# Patient Record
Sex: Male | Born: 1940 | Race: White | Hispanic: No | Marital: Married | Smoking: Former smoker
Health system: Southern US, Community
[De-identification: ages and names within clinical notes are randomized; demographics above are authoritative.]

## PROBLEM LIST (undated history)

## (undated) DIAGNOSIS — I2510 Atherosclerotic heart disease of native coronary artery without angina pectoris: Secondary | ICD-10-CM

## (undated) DIAGNOSIS — H269 Unspecified cataract: Secondary | ICD-10-CM

## (undated) DIAGNOSIS — I10 Essential (primary) hypertension: Secondary | ICD-10-CM

## (undated) DIAGNOSIS — K279 Peptic ulcer, site unspecified, unspecified as acute or chronic, without hemorrhage or perforation: Secondary | ICD-10-CM

## (undated) DIAGNOSIS — E785 Hyperlipidemia, unspecified: Secondary | ICD-10-CM

## (undated) DIAGNOSIS — K635 Polyp of colon: Secondary | ICD-10-CM

## (undated) DIAGNOSIS — H539 Unspecified visual disturbance: Secondary | ICD-10-CM

## (undated) DIAGNOSIS — I255 Ischemic cardiomyopathy: Secondary | ICD-10-CM

## (undated) DIAGNOSIS — F32A Depression, unspecified: Secondary | ICD-10-CM

## (undated) DIAGNOSIS — R51 Headache: Secondary | ICD-10-CM

## (undated) DIAGNOSIS — F329 Major depressive disorder, single episode, unspecified: Secondary | ICD-10-CM

## (undated) DIAGNOSIS — E611 Iron deficiency: Secondary | ICD-10-CM

## (undated) DIAGNOSIS — T7840XA Allergy, unspecified, initial encounter: Secondary | ICD-10-CM

## (undated) DIAGNOSIS — M1990 Unspecified osteoarthritis, unspecified site: Secondary | ICD-10-CM

## (undated) DIAGNOSIS — I219 Acute myocardial infarction, unspecified: Secondary | ICD-10-CM

## (undated) DIAGNOSIS — F419 Anxiety disorder, unspecified: Secondary | ICD-10-CM

## (undated) DIAGNOSIS — K219 Gastro-esophageal reflux disease without esophagitis: Secondary | ICD-10-CM

## (undated) HISTORY — DX: Polyp of colon: K63.5

## (undated) HISTORY — PX: INGUINAL HERNIA REPAIR: SUR1180

## (undated) HISTORY — DX: Atherosclerotic heart disease of native coronary artery without angina pectoris: I25.10

## (undated) HISTORY — DX: Gastro-esophageal reflux disease without esophagitis: K21.9

## (undated) HISTORY — DX: Acute myocardial infarction, unspecified: I21.9

## (undated) HISTORY — DX: Essential (primary) hypertension: I10

## (undated) HISTORY — DX: Unspecified osteoarthritis, unspecified site: M19.90

## (undated) HISTORY — DX: Peptic ulcer, site unspecified, unspecified as acute or chronic, without hemorrhage or perforation: K27.9

## (undated) HISTORY — DX: Ischemic cardiomyopathy: I25.5

## (undated) HISTORY — DX: Major depressive disorder, single episode, unspecified: F32.9

## (undated) HISTORY — DX: Headache: R51

## (undated) HISTORY — DX: Depression, unspecified: F32.A

## (undated) HISTORY — DX: Anxiety disorder, unspecified: F41.9

## (undated) HISTORY — DX: Iron deficiency: E61.1

## (undated) HISTORY — DX: Unspecified visual disturbance: H53.9

## (undated) HISTORY — PX: TONSILLECTOMY: SUR1361

## (undated) HISTORY — DX: Hyperlipidemia, unspecified: E78.5

## (undated) HISTORY — DX: Allergy, unspecified, initial encounter: T78.40XA

## (undated) HISTORY — DX: Unspecified cataract: H26.9

---

## 1999-06-30 ENCOUNTER — Emergency Department (HOSPITAL_COMMUNITY): Admission: EM | Admit: 1999-06-30 | Discharge: 1999-06-30 | Payer: Self-pay

## 2000-04-23 ENCOUNTER — Encounter: Admission: RE | Admit: 2000-04-23 | Discharge: 2000-04-23 | Payer: Self-pay | Admitting: Family Medicine

## 2000-04-23 ENCOUNTER — Encounter: Payer: Self-pay | Admitting: Family Medicine

## 2000-10-08 ENCOUNTER — Encounter: Payer: Self-pay | Admitting: Family Medicine

## 2000-10-08 ENCOUNTER — Encounter: Admission: RE | Admit: 2000-10-08 | Discharge: 2000-10-08 | Payer: Self-pay | Admitting: Family Medicine

## 2001-02-02 ENCOUNTER — Encounter: Payer: Self-pay | Admitting: Family Medicine

## 2001-02-02 ENCOUNTER — Encounter: Admission: RE | Admit: 2001-02-02 | Discharge: 2001-02-02 | Payer: Self-pay | Admitting: Family Medicine

## 2002-02-01 ENCOUNTER — Encounter: Payer: Self-pay | Admitting: Family Medicine

## 2002-02-01 ENCOUNTER — Encounter: Admission: RE | Admit: 2002-02-01 | Discharge: 2002-02-01 | Payer: Self-pay | Admitting: Family Medicine

## 2002-03-04 HISTORY — PX: CORONARY ARTERY BYPASS GRAFT: SHX141

## 2002-11-04 ENCOUNTER — Inpatient Hospital Stay (HOSPITAL_COMMUNITY): Admission: AD | Admit: 2002-11-04 | Discharge: 2002-11-16 | Payer: Self-pay | Admitting: Cardiology

## 2002-11-04 ENCOUNTER — Encounter: Payer: Self-pay | Admitting: Cardiology

## 2002-11-10 ENCOUNTER — Encounter: Payer: Self-pay | Admitting: Surgery

## 2002-11-11 ENCOUNTER — Encounter: Payer: Self-pay | Admitting: Surgery

## 2002-11-12 ENCOUNTER — Encounter: Payer: Self-pay | Admitting: Surgery

## 2002-11-15 ENCOUNTER — Encounter: Payer: Self-pay | Admitting: Surgery

## 2002-12-13 ENCOUNTER — Encounter (HOSPITAL_COMMUNITY): Admission: RE | Admit: 2002-12-13 | Discharge: 2003-02-07 | Payer: Self-pay | Admitting: Cardiology

## 2003-02-08 ENCOUNTER — Encounter (HOSPITAL_COMMUNITY): Admission: RE | Admit: 2003-02-08 | Discharge: 2003-05-09 | Payer: Self-pay | Admitting: Cardiology

## 2003-06-01 ENCOUNTER — Emergency Department (HOSPITAL_COMMUNITY): Admission: EM | Admit: 2003-06-01 | Discharge: 2003-06-01 | Payer: Self-pay | Admitting: Emergency Medicine

## 2003-06-03 DIAGNOSIS — R51 Headache: Secondary | ICD-10-CM

## 2003-06-03 HISTORY — DX: Headache: R51

## 2003-06-04 ENCOUNTER — Encounter (HOSPITAL_COMMUNITY): Admission: RE | Admit: 2003-06-04 | Discharge: 2003-09-02 | Payer: Self-pay | Admitting: Cardiology

## 2003-09-06 ENCOUNTER — Encounter (HOSPITAL_COMMUNITY): Admission: RE | Admit: 2003-09-06 | Discharge: 2003-12-05 | Payer: Self-pay | Admitting: Cardiology

## 2003-12-06 ENCOUNTER — Encounter (HOSPITAL_COMMUNITY): Admission: RE | Admit: 2003-12-06 | Discharge: 2004-03-05 | Payer: Self-pay | Admitting: Cardiology

## 2004-01-10 ENCOUNTER — Ambulatory Visit: Payer: Self-pay | Admitting: Internal Medicine

## 2004-02-01 ENCOUNTER — Ambulatory Visit: Payer: Self-pay | Admitting: Internal Medicine

## 2004-02-23 ENCOUNTER — Ambulatory Visit: Payer: Self-pay | Admitting: Cardiology

## 2004-03-04 LAB — HM COLONOSCOPY

## 2004-03-06 ENCOUNTER — Encounter (HOSPITAL_COMMUNITY): Admission: RE | Admit: 2004-03-06 | Discharge: 2004-06-04 | Payer: Self-pay | Admitting: Cardiology

## 2004-03-19 ENCOUNTER — Ambulatory Visit: Payer: Self-pay | Admitting: Family Medicine

## 2004-03-23 ENCOUNTER — Ambulatory Visit: Payer: Self-pay | Admitting: Internal Medicine

## 2004-04-30 ENCOUNTER — Ambulatory Visit: Payer: Self-pay | Admitting: Internal Medicine

## 2004-06-05 ENCOUNTER — Encounter (HOSPITAL_COMMUNITY): Admission: RE | Admit: 2004-06-05 | Discharge: 2004-07-02 | Payer: Self-pay | Admitting: Cardiology

## 2004-06-28 ENCOUNTER — Ambulatory Visit: Payer: Self-pay | Admitting: Internal Medicine

## 2004-07-31 ENCOUNTER — Ambulatory Visit: Payer: Self-pay | Admitting: Internal Medicine

## 2004-08-21 ENCOUNTER — Ambulatory Visit: Payer: Self-pay | Admitting: Internal Medicine

## 2004-09-11 ENCOUNTER — Ambulatory Visit: Payer: Self-pay | Admitting: Cardiology

## 2004-10-26 ENCOUNTER — Ambulatory Visit: Payer: Self-pay | Admitting: Internal Medicine

## 2004-11-12 ENCOUNTER — Ambulatory Visit: Payer: Self-pay | Admitting: Internal Medicine

## 2004-12-28 ENCOUNTER — Ambulatory Visit: Payer: Self-pay | Admitting: Internal Medicine

## 2005-02-11 ENCOUNTER — Ambulatory Visit: Payer: Self-pay | Admitting: Internal Medicine

## 2005-03-18 ENCOUNTER — Ambulatory Visit: Payer: Self-pay | Admitting: Cardiology

## 2005-04-26 ENCOUNTER — Ambulatory Visit: Payer: Self-pay | Admitting: Internal Medicine

## 2005-05-15 ENCOUNTER — Ambulatory Visit: Payer: Self-pay | Admitting: Internal Medicine

## 2005-05-22 ENCOUNTER — Ambulatory Visit: Payer: Self-pay | Admitting: Internal Medicine

## 2005-09-23 ENCOUNTER — Ambulatory Visit: Payer: Self-pay | Admitting: Internal Medicine

## 2005-10-07 ENCOUNTER — Ambulatory Visit: Payer: Self-pay | Admitting: Internal Medicine

## 2005-11-07 ENCOUNTER — Ambulatory Visit: Payer: Self-pay | Admitting: Internal Medicine

## 2005-12-19 ENCOUNTER — Ambulatory Visit: Payer: Self-pay | Admitting: Internal Medicine

## 2006-03-04 DIAGNOSIS — K635 Polyp of colon: Secondary | ICD-10-CM

## 2006-03-04 HISTORY — DX: Polyp of colon: K63.5

## 2006-03-12 ENCOUNTER — Ambulatory Visit: Payer: Self-pay | Admitting: Internal Medicine

## 2006-03-19 ENCOUNTER — Ambulatory Visit: Payer: Self-pay | Admitting: Internal Medicine

## 2006-03-21 ENCOUNTER — Ambulatory Visit: Payer: Self-pay | Admitting: Internal Medicine

## 2006-03-21 LAB — CONVERTED CEMR LAB
ALT: 23 units/L (ref 0–40)
AST: 20 units/L (ref 0–37)
Albumin: 3 g/dL — ABNORMAL LOW (ref 3.5–5.2)
Alkaline Phosphatase: 55 units/L (ref 39–117)
BUN: 23 mg/dL (ref 6–23)
CO2: 29 meq/L (ref 19–32)
Calcium: 8.8 mg/dL (ref 8.4–10.5)
Chloride: 104 meq/L (ref 96–112)
Creatinine, Ser: 1.5 mg/dL (ref 0.4–1.5)
GFR calc Af Amer: 60 mL/min
GFR calc non Af Amer: 50 mL/min
Glucose, Bld: 145 mg/dL — ABNORMAL HIGH (ref 70–99)
HCT: 38 % — ABNORMAL LOW (ref 39.0–52.0)
Hemoglobin: 12.1 g/dL — ABNORMAL LOW (ref 13.0–17.0)
MCHC: 31.8 g/dL (ref 30.0–36.0)
MCV: 80.3 fL (ref 78.0–100.0)
Platelets: 230 10*3/uL (ref 150–400)
Potassium: 3.3 meq/L — ABNORMAL LOW (ref 3.5–5.1)
RBC: 4.73 M/uL (ref 4.22–5.81)
RDW: 15.7 % — ABNORMAL HIGH (ref 11.5–14.6)
Sodium: 140 meq/L (ref 135–145)
Total Bilirubin: 0.7 mg/dL (ref 0.3–1.2)
Total Protein: 6.3 g/dL (ref 6.0–8.3)
WBC: 8.9 10*3/uL (ref 4.5–10.5)

## 2006-06-24 DIAGNOSIS — R519 Headache, unspecified: Secondary | ICD-10-CM | POA: Insufficient documentation

## 2006-06-24 DIAGNOSIS — F32A Depression, unspecified: Secondary | ICD-10-CM | POA: Insufficient documentation

## 2006-06-24 DIAGNOSIS — Z8679 Personal history of other diseases of the circulatory system: Secondary | ICD-10-CM | POA: Insufficient documentation

## 2006-06-24 DIAGNOSIS — K219 Gastro-esophageal reflux disease without esophagitis: Secondary | ICD-10-CM | POA: Insufficient documentation

## 2006-06-24 DIAGNOSIS — F329 Major depressive disorder, single episode, unspecified: Secondary | ICD-10-CM

## 2006-06-24 DIAGNOSIS — F419 Anxiety disorder, unspecified: Secondary | ICD-10-CM | POA: Insufficient documentation

## 2006-06-24 DIAGNOSIS — R51 Headache: Secondary | ICD-10-CM | POA: Insufficient documentation

## 2006-06-24 DIAGNOSIS — Z9889 Other specified postprocedural states: Secondary | ICD-10-CM | POA: Insufficient documentation

## 2006-06-24 DIAGNOSIS — I10 Essential (primary) hypertension: Secondary | ICD-10-CM | POA: Insufficient documentation

## 2006-06-24 DIAGNOSIS — E785 Hyperlipidemia, unspecified: Secondary | ICD-10-CM | POA: Insufficient documentation

## 2006-07-24 ENCOUNTER — Ambulatory Visit: Payer: Self-pay | Admitting: Cardiology

## 2006-08-11 ENCOUNTER — Encounter (INDEPENDENT_AMBULATORY_CARE_PROVIDER_SITE_OTHER): Payer: Self-pay | Admitting: *Deleted

## 2006-09-02 ENCOUNTER — Ambulatory Visit: Payer: Self-pay | Admitting: Internal Medicine

## 2006-09-03 ENCOUNTER — Ambulatory Visit: Payer: Self-pay | Admitting: Internal Medicine

## 2006-09-12 ENCOUNTER — Encounter (INDEPENDENT_AMBULATORY_CARE_PROVIDER_SITE_OTHER): Payer: Self-pay | Admitting: *Deleted

## 2006-09-12 LAB — CONVERTED CEMR LAB
ALT: 17 units/L (ref 0–53)
AST: 22 units/L (ref 0–37)
BUN: 14 mg/dL (ref 6–23)
Basophils Absolute: 0 10*3/uL (ref 0.0–0.1)
CO2: 30 meq/L (ref 19–32)
Chloride: 107 meq/L (ref 96–112)
Cholesterol: 126 mg/dL (ref 0–200)
GFR calc Af Amer: 96 mL/min
HDL: 33.2 mg/dL — ABNORMAL LOW (ref >39.0)
Hemoglobin: 11.6 g/dL — ABNORMAL LOW (ref 13.0–17.0)
Hgb A1c MFr Bld: 6.4 % — ABNORMAL HIGH (ref 4.6–6.0)
LDL Cholesterol: 72 mg/dL (ref 0–99)
Lymphocytes Relative: 23 % (ref 12.0–46.0)
MCV: 80.5 fL (ref 78.0–100.0)
Monocytes Absolute: 0.5 10*3/uL (ref 0.2–0.7)
Neutro Abs: 3.3 10*3/uL (ref 1.4–7.7)
Potassium: 4.3 meq/L (ref 3.5–5.1)
Sodium: 141 meq/L (ref 135–145)
Total CHOL/HDL Ratio: 3.8
Triglycerides: 102 mg/dL (ref 0–149)
VLDL: 20 mg/dL (ref 0–40)
WBC: 5.9 10*3/uL (ref 4.5–10.5)

## 2006-09-24 ENCOUNTER — Ambulatory Visit: Payer: Self-pay | Admitting: Internal Medicine

## 2006-09-25 LAB — CONVERTED CEMR LAB: Ferritin: 16.8 ng/mL — ABNORMAL LOW (ref 22.0–322.0)

## 2006-09-26 ENCOUNTER — Telehealth (INDEPENDENT_AMBULATORY_CARE_PROVIDER_SITE_OTHER): Payer: Self-pay | Admitting: *Deleted

## 2006-10-10 ENCOUNTER — Telehealth (INDEPENDENT_AMBULATORY_CARE_PROVIDER_SITE_OTHER): Payer: Self-pay | Admitting: *Deleted

## 2006-10-15 ENCOUNTER — Ambulatory Visit: Payer: Self-pay | Admitting: Internal Medicine

## 2006-10-23 ENCOUNTER — Ambulatory Visit: Payer: Self-pay | Admitting: Internal Medicine

## 2006-10-27 LAB — CONVERTED CEMR LAB
BUN: 14 mg/dL (ref 6–23)
Chloride: 99 meq/L (ref 96–112)
Creatinine, Ser: 1.1 mg/dL (ref 0.4–1.5)
GFR calc Af Amer: 86 mL/min
GFR calc non Af Amer: 71 mL/min
Potassium: 4.7 meq/L (ref 3.5–5.1)
Sodium: 136 meq/L (ref 135–145)

## 2006-10-28 ENCOUNTER — Telehealth (INDEPENDENT_AMBULATORY_CARE_PROVIDER_SITE_OTHER): Payer: Self-pay | Admitting: *Deleted

## 2006-10-29 ENCOUNTER — Ambulatory Visit: Payer: Self-pay | Admitting: Gastroenterology

## 2006-11-05 ENCOUNTER — Encounter: Payer: Self-pay | Admitting: Internal Medicine

## 2006-12-03 ENCOUNTER — Ambulatory Visit: Payer: Self-pay | Admitting: Gastroenterology

## 2006-12-03 ENCOUNTER — Encounter: Payer: Self-pay | Admitting: Internal Medicine

## 2006-12-03 ENCOUNTER — Encounter: Payer: Self-pay | Admitting: Gastroenterology

## 2006-12-24 ENCOUNTER — Telehealth (INDEPENDENT_AMBULATORY_CARE_PROVIDER_SITE_OTHER): Payer: Self-pay | Admitting: *Deleted

## 2007-01-14 ENCOUNTER — Ambulatory Visit: Payer: Self-pay | Admitting: Internal Medicine

## 2007-02-02 ENCOUNTER — Encounter (INDEPENDENT_AMBULATORY_CARE_PROVIDER_SITE_OTHER): Payer: Self-pay | Admitting: *Deleted

## 2007-03-02 ENCOUNTER — Telehealth (INDEPENDENT_AMBULATORY_CARE_PROVIDER_SITE_OTHER): Payer: Self-pay | Admitting: *Deleted

## 2007-03-10 ENCOUNTER — Ambulatory Visit: Payer: Self-pay | Admitting: Internal Medicine

## 2007-03-17 ENCOUNTER — Telehealth (INDEPENDENT_AMBULATORY_CARE_PROVIDER_SITE_OTHER): Payer: Self-pay | Admitting: *Deleted

## 2007-03-17 ENCOUNTER — Encounter: Payer: Self-pay | Admitting: Internal Medicine

## 2007-03-19 ENCOUNTER — Telehealth (INDEPENDENT_AMBULATORY_CARE_PROVIDER_SITE_OTHER): Payer: Self-pay | Admitting: *Deleted

## 2007-03-24 ENCOUNTER — Telehealth: Payer: Self-pay | Admitting: Internal Medicine

## 2007-06-01 DIAGNOSIS — I2510 Atherosclerotic heart disease of native coronary artery without angina pectoris: Secondary | ICD-10-CM | POA: Insufficient documentation

## 2007-06-01 DIAGNOSIS — D126 Benign neoplasm of colon, unspecified: Secondary | ICD-10-CM | POA: Insufficient documentation

## 2007-06-01 DIAGNOSIS — M1990 Unspecified osteoarthritis, unspecified site: Secondary | ICD-10-CM | POA: Insufficient documentation

## 2007-06-09 ENCOUNTER — Ambulatory Visit: Payer: Self-pay | Admitting: Internal Medicine

## 2007-06-17 LAB — CONVERTED CEMR LAB
ALT: 31 units/L (ref 0–53)
Calcium: 9.6 mg/dL (ref 8.4–10.5)
Chloride: 99 meq/L (ref 96–112)
Hemoglobin: 17.4 g/dL — ABNORMAL HIGH (ref 13.0–17.0)
Hgb A1c MFr Bld: 5.8 % (ref 4.6–6.0)
Potassium: 4.5 meq/L (ref 3.5–5.1)
Saturation Ratios: 27.5 % (ref 20.0–50.0)
Sodium: 138 meq/L (ref 135–145)

## 2007-07-25 ENCOUNTER — Ambulatory Visit: Payer: Self-pay | Admitting: Family Medicine

## 2007-08-03 ENCOUNTER — Ambulatory Visit: Payer: Self-pay | Admitting: Cardiology

## 2007-08-15 ENCOUNTER — Emergency Department (HOSPITAL_COMMUNITY): Admission: EM | Admit: 2007-08-15 | Discharge: 2007-08-15 | Payer: Self-pay | Admitting: Family Medicine

## 2007-08-17 ENCOUNTER — Encounter: Payer: Self-pay | Admitting: Cardiology

## 2007-08-17 ENCOUNTER — Ambulatory Visit: Payer: Self-pay | Admitting: Cardiology

## 2007-08-17 ENCOUNTER — Ambulatory Visit: Payer: Self-pay

## 2007-08-17 LAB — CONVERTED CEMR LAB
BUN: 19 mg/dL (ref 6–23)
CO2: 31 meq/L (ref 19–32)
Calcium: 9.4 mg/dL (ref 8.4–10.5)
Chloride: 100 meq/L (ref 96–112)
Creatinine, Ser: 1.4 mg/dL (ref 0.4–1.5)
GFR calc Af Amer: 65 mL/min
GFR calc non Af Amer: 54 mL/min
Glucose, Bld: 90 mg/dL (ref 70–99)
Potassium: 4.5 meq/L (ref 3.5–5.1)
Sodium: 137 meq/L (ref 135–145)

## 2007-09-02 ENCOUNTER — Telehealth (INDEPENDENT_AMBULATORY_CARE_PROVIDER_SITE_OTHER): Payer: Self-pay | Admitting: *Deleted

## 2007-09-08 ENCOUNTER — Ambulatory Visit: Payer: Self-pay | Admitting: Internal Medicine

## 2007-09-15 ENCOUNTER — Telehealth (INDEPENDENT_AMBULATORY_CARE_PROVIDER_SITE_OTHER): Payer: Self-pay | Admitting: *Deleted

## 2007-10-05 ENCOUNTER — Ambulatory Visit: Payer: Self-pay | Admitting: Internal Medicine

## 2007-10-08 ENCOUNTER — Encounter (INDEPENDENT_AMBULATORY_CARE_PROVIDER_SITE_OTHER): Payer: Self-pay | Admitting: *Deleted

## 2007-10-23 ENCOUNTER — Ambulatory Visit: Payer: Self-pay | Admitting: Family Medicine

## 2007-10-23 ENCOUNTER — Telehealth (INDEPENDENT_AMBULATORY_CARE_PROVIDER_SITE_OTHER): Payer: Self-pay | Admitting: *Deleted

## 2007-11-25 ENCOUNTER — Ambulatory Visit: Payer: Self-pay | Admitting: Internal Medicine

## 2007-12-03 ENCOUNTER — Ambulatory Visit: Payer: Self-pay | Admitting: Cardiology

## 2008-02-04 ENCOUNTER — Ambulatory Visit: Payer: Self-pay | Admitting: Internal Medicine

## 2008-02-04 DIAGNOSIS — E291 Testicular hypofunction: Secondary | ICD-10-CM | POA: Insufficient documentation

## 2008-02-17 ENCOUNTER — Telehealth (INDEPENDENT_AMBULATORY_CARE_PROVIDER_SITE_OTHER): Payer: Self-pay | Admitting: *Deleted

## 2008-02-18 ENCOUNTER — Telehealth (INDEPENDENT_AMBULATORY_CARE_PROVIDER_SITE_OTHER): Payer: Self-pay | Admitting: *Deleted

## 2008-02-18 ENCOUNTER — Ambulatory Visit: Payer: Self-pay | Admitting: Internal Medicine

## 2008-02-23 ENCOUNTER — Ambulatory Visit: Payer: Self-pay | Admitting: Hematology and Oncology

## 2008-02-24 ENCOUNTER — Encounter (INDEPENDENT_AMBULATORY_CARE_PROVIDER_SITE_OTHER): Payer: Self-pay | Admitting: *Deleted

## 2008-02-24 LAB — CONVERTED CEMR LAB
Folate: 18.1 ng/mL
Vitamin B-12: 559 pg/mL (ref 211–911)

## 2008-03-03 ENCOUNTER — Encounter: Payer: Self-pay | Admitting: Internal Medicine

## 2008-03-03 LAB — CBC WITH DIFFERENTIAL/PLATELET
Basophils Absolute: 0 10*3/uL (ref 0.0–0.1)
EOS%: 10.3 % — ABNORMAL HIGH (ref 0.0–7.0)
HCT: 44.2 % (ref 38.7–49.9)
HGB: 15.5 g/dL (ref 13.0–17.1)
MONO#: 0.5 10*3/uL (ref 0.1–0.9)
NEUT#: 4.1 10*3/uL (ref 1.5–6.5)
NEUT%: 63.4 % (ref 40.0–75.0)
RDW: 12.2 % (ref 11.2–14.6)
WBC: 6.5 10*3/uL (ref 4.0–10.0)
lymph#: 1.2 10*3/uL (ref 0.9–3.3)

## 2008-03-03 LAB — TECHNOLOGIST REVIEW

## 2008-03-07 ENCOUNTER — Encounter: Payer: Self-pay | Admitting: Internal Medicine

## 2008-03-07 ENCOUNTER — Ambulatory Visit (HOSPITAL_COMMUNITY): Admission: RE | Admit: 2008-03-07 | Discharge: 2008-03-07 | Payer: Self-pay | Admitting: Hematology and Oncology

## 2008-03-08 LAB — PROTEIN ELECTROPHORESIS, SERUM, WITH REFLEX
Alpha-1-Globulin: 4.6 % (ref 2.9–4.9)
Alpha-2-Globulin: 10.4 % (ref 7.1–11.8)
Beta 2: 4.1 % (ref 3.2–6.5)
Gamma Globulin: 12.9 % (ref 11.1–18.8)

## 2008-03-08 LAB — COMPREHENSIVE METABOLIC PANEL
ALT: 25 U/L (ref 0–53)
Albumin: 4.5 g/dL (ref 3.5–5.2)
Alkaline Phosphatase: 60 U/L (ref 39–117)
CO2: 27 mEq/L (ref 19–32)
Glucose, Bld: 142 mg/dL — ABNORMAL HIGH (ref 70–99)
Potassium: 4.5 mEq/L (ref 3.5–5.3)
Sodium: 139 mEq/L (ref 135–145)
Total Bilirubin: 0.6 mg/dL (ref 0.3–1.2)
Total Protein: 7.1 g/dL (ref 6.0–8.3)

## 2008-03-08 LAB — LACTATE DEHYDROGENASE: LDH: 179 U/L (ref 94–250)

## 2008-03-08 LAB — D-DIMER, QUANTITATIVE: D-Dimer, Quant: 1.08 ug/mL-FEU — ABNORMAL HIGH (ref 0.00–0.48)

## 2008-03-14 ENCOUNTER — Telehealth (INDEPENDENT_AMBULATORY_CARE_PROVIDER_SITE_OTHER): Payer: Self-pay | Admitting: *Deleted

## 2008-03-16 ENCOUNTER — Encounter: Payer: Self-pay | Admitting: Internal Medicine

## 2008-03-16 ENCOUNTER — Ambulatory Visit: Payer: Self-pay | Admitting: Hematology and Oncology

## 2008-03-25 ENCOUNTER — Telehealth (INDEPENDENT_AMBULATORY_CARE_PROVIDER_SITE_OTHER): Payer: Self-pay | Admitting: *Deleted

## 2008-03-31 ENCOUNTER — Telehealth (INDEPENDENT_AMBULATORY_CARE_PROVIDER_SITE_OTHER): Payer: Self-pay | Admitting: *Deleted

## 2008-04-06 ENCOUNTER — Encounter: Payer: Self-pay | Admitting: Internal Medicine

## 2008-05-03 ENCOUNTER — Ambulatory Visit: Payer: Self-pay | Admitting: Cardiology

## 2008-05-05 ENCOUNTER — Ambulatory Visit: Payer: Self-pay

## 2008-05-05 ENCOUNTER — Telehealth: Payer: Self-pay | Admitting: Internal Medicine

## 2008-05-16 ENCOUNTER — Emergency Department (HOSPITAL_BASED_OUTPATIENT_CLINIC_OR_DEPARTMENT_OTHER): Admission: EM | Admit: 2008-05-16 | Discharge: 2008-05-16 | Payer: Self-pay | Admitting: Emergency Medicine

## 2008-05-16 ENCOUNTER — Telehealth: Payer: Self-pay | Admitting: Internal Medicine

## 2008-05-16 ENCOUNTER — Ambulatory Visit: Payer: Self-pay | Admitting: Diagnostic Radiology

## 2008-06-06 ENCOUNTER — Ambulatory Visit: Payer: Self-pay | Admitting: Internal Medicine

## 2008-06-06 DIAGNOSIS — D696 Thrombocytopenia, unspecified: Secondary | ICD-10-CM | POA: Insufficient documentation

## 2008-06-06 LAB — CONVERTED CEMR LAB: FSH: 3.7 milliintl units/mL (ref 1.4–18.1)

## 2008-06-24 ENCOUNTER — Encounter (INDEPENDENT_AMBULATORY_CARE_PROVIDER_SITE_OTHER): Payer: Self-pay | Admitting: *Deleted

## 2008-06-27 ENCOUNTER — Telehealth: Payer: Self-pay | Admitting: Internal Medicine

## 2008-06-28 ENCOUNTER — Ambulatory Visit: Payer: Self-pay | Admitting: Hematology and Oncology

## 2008-07-05 LAB — CONVERTED CEMR LAB
Hgb A1c MFr Bld: 6 % (ref 4.6–6.5)
TSH: 0.45 microintl units/mL (ref 0.35–5.50)

## 2008-07-13 ENCOUNTER — Ambulatory Visit: Payer: Self-pay | Admitting: Internal Medicine

## 2008-09-26 ENCOUNTER — Encounter (INDEPENDENT_AMBULATORY_CARE_PROVIDER_SITE_OTHER): Payer: Self-pay | Admitting: *Deleted

## 2008-09-29 ENCOUNTER — Telehealth (INDEPENDENT_AMBULATORY_CARE_PROVIDER_SITE_OTHER): Payer: Self-pay | Admitting: *Deleted

## 2008-11-15 ENCOUNTER — Telehealth (INDEPENDENT_AMBULATORY_CARE_PROVIDER_SITE_OTHER): Payer: Self-pay | Admitting: *Deleted

## 2008-11-15 ENCOUNTER — Ambulatory Visit: Payer: Self-pay | Admitting: Internal Medicine

## 2008-11-15 DIAGNOSIS — E1140 Type 2 diabetes mellitus with diabetic neuropathy, unspecified: Secondary | ICD-10-CM | POA: Insufficient documentation

## 2008-11-22 LAB — CONVERTED CEMR LAB
Basophils Relative: 0.8 % (ref 0.0–3.0)
Creatinine, Ser: 1.4 mg/dL (ref 0.4–1.5)
HCT: 43.1 % (ref 39.0–52.0)
Hgb A1c MFr Bld: 5.7 % (ref 4.6–6.5)
Lymphs Abs: 1.7 10*3/uL (ref 0.7–4.0)
MCV: 100.8 fL — ABNORMAL HIGH (ref 78.0–100.0)
Monocytes Absolute: 0.7 10*3/uL (ref 0.1–1.0)
Neutro Abs: 4.1 10*3/uL (ref 1.4–7.7)
Neutrophils Relative %: 51 % (ref 43.0–77.0)
Platelets: 117 10*3/uL — ABNORMAL LOW (ref 150.0–400.0)
RDW: 12.1 % (ref 11.5–14.6)
Sodium: 140 meq/L (ref 135–145)

## 2008-11-30 ENCOUNTER — Ambulatory Visit: Payer: Self-pay | Admitting: Internal Medicine

## 2008-12-13 ENCOUNTER — Ambulatory Visit: Payer: Self-pay | Admitting: Internal Medicine

## 2008-12-19 ENCOUNTER — Encounter (INDEPENDENT_AMBULATORY_CARE_PROVIDER_SITE_OTHER): Payer: Self-pay | Admitting: *Deleted

## 2008-12-19 LAB — CONVERTED CEMR LAB: Potassium: 4.7 meq/L (ref 3.5–5.1)

## 2008-12-28 ENCOUNTER — Ambulatory Visit: Payer: Self-pay | Admitting: Cardiology

## 2008-12-28 ENCOUNTER — Telehealth (INDEPENDENT_AMBULATORY_CARE_PROVIDER_SITE_OTHER): Payer: Self-pay | Admitting: *Deleted

## 2009-01-10 ENCOUNTER — Telehealth (INDEPENDENT_AMBULATORY_CARE_PROVIDER_SITE_OTHER): Payer: Self-pay | Admitting: *Deleted

## 2009-02-14 IMAGING — CR DG HIP (WITH OR WITHOUT PELVIS) 2-3V*L*
3 series · 3 of 3 positions shown · non-contrast
Comparison: None available.

CLINICAL DATA: Left hip pain.

LEFT HIP - COMPLETE 2+ VIEW

[view not recorded (1 of 3)]
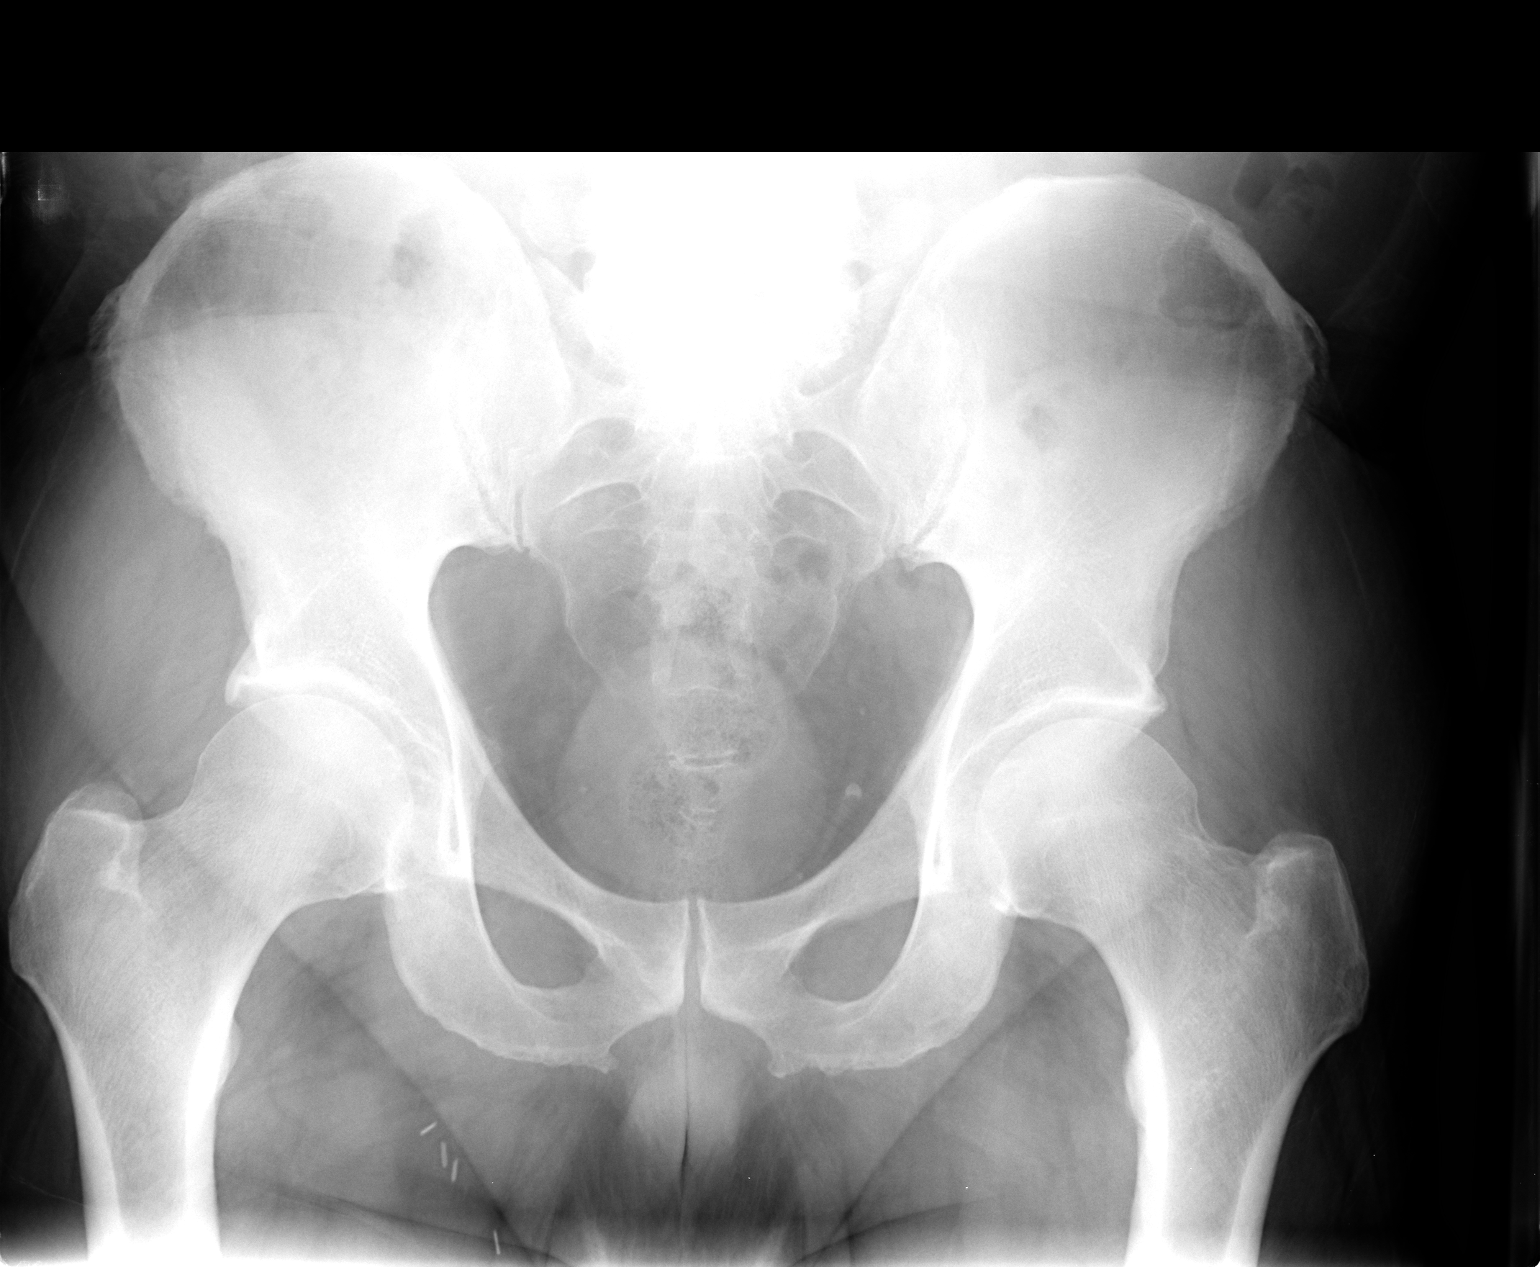

[view not recorded (2 of 3)]
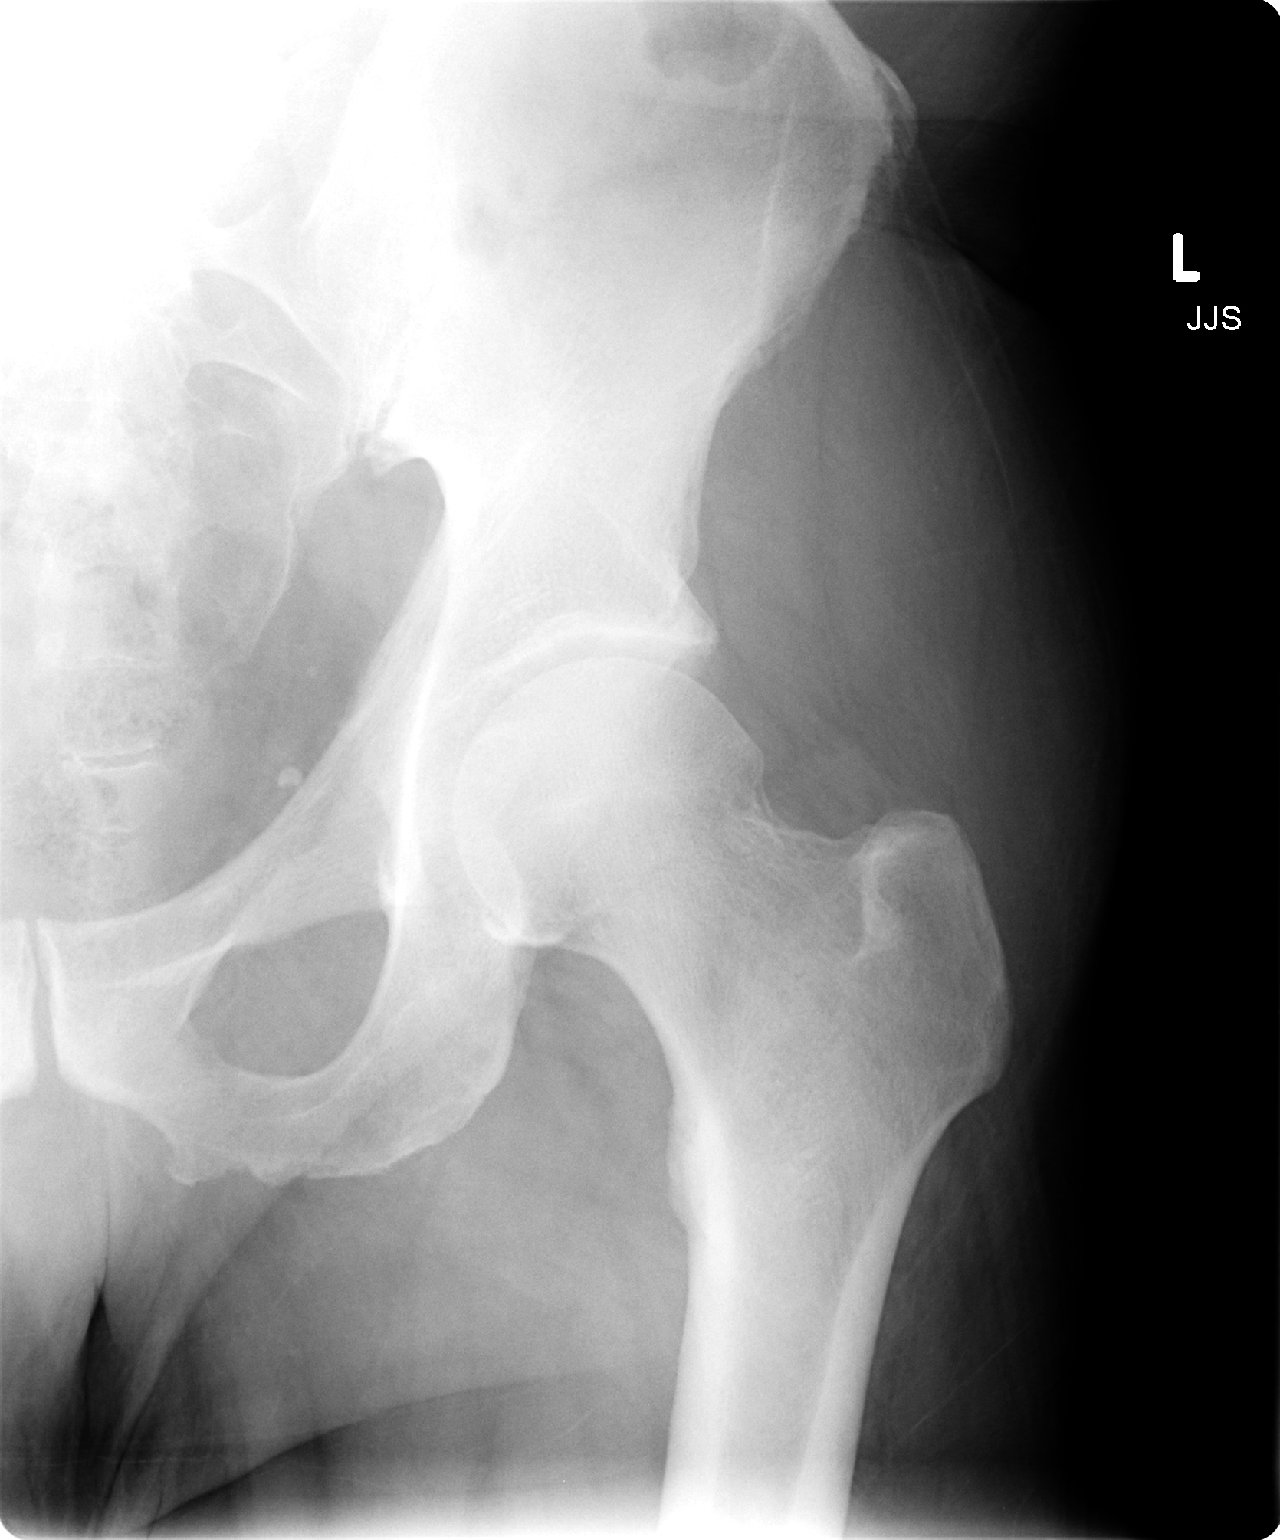

[view not recorded (3 of 3)]
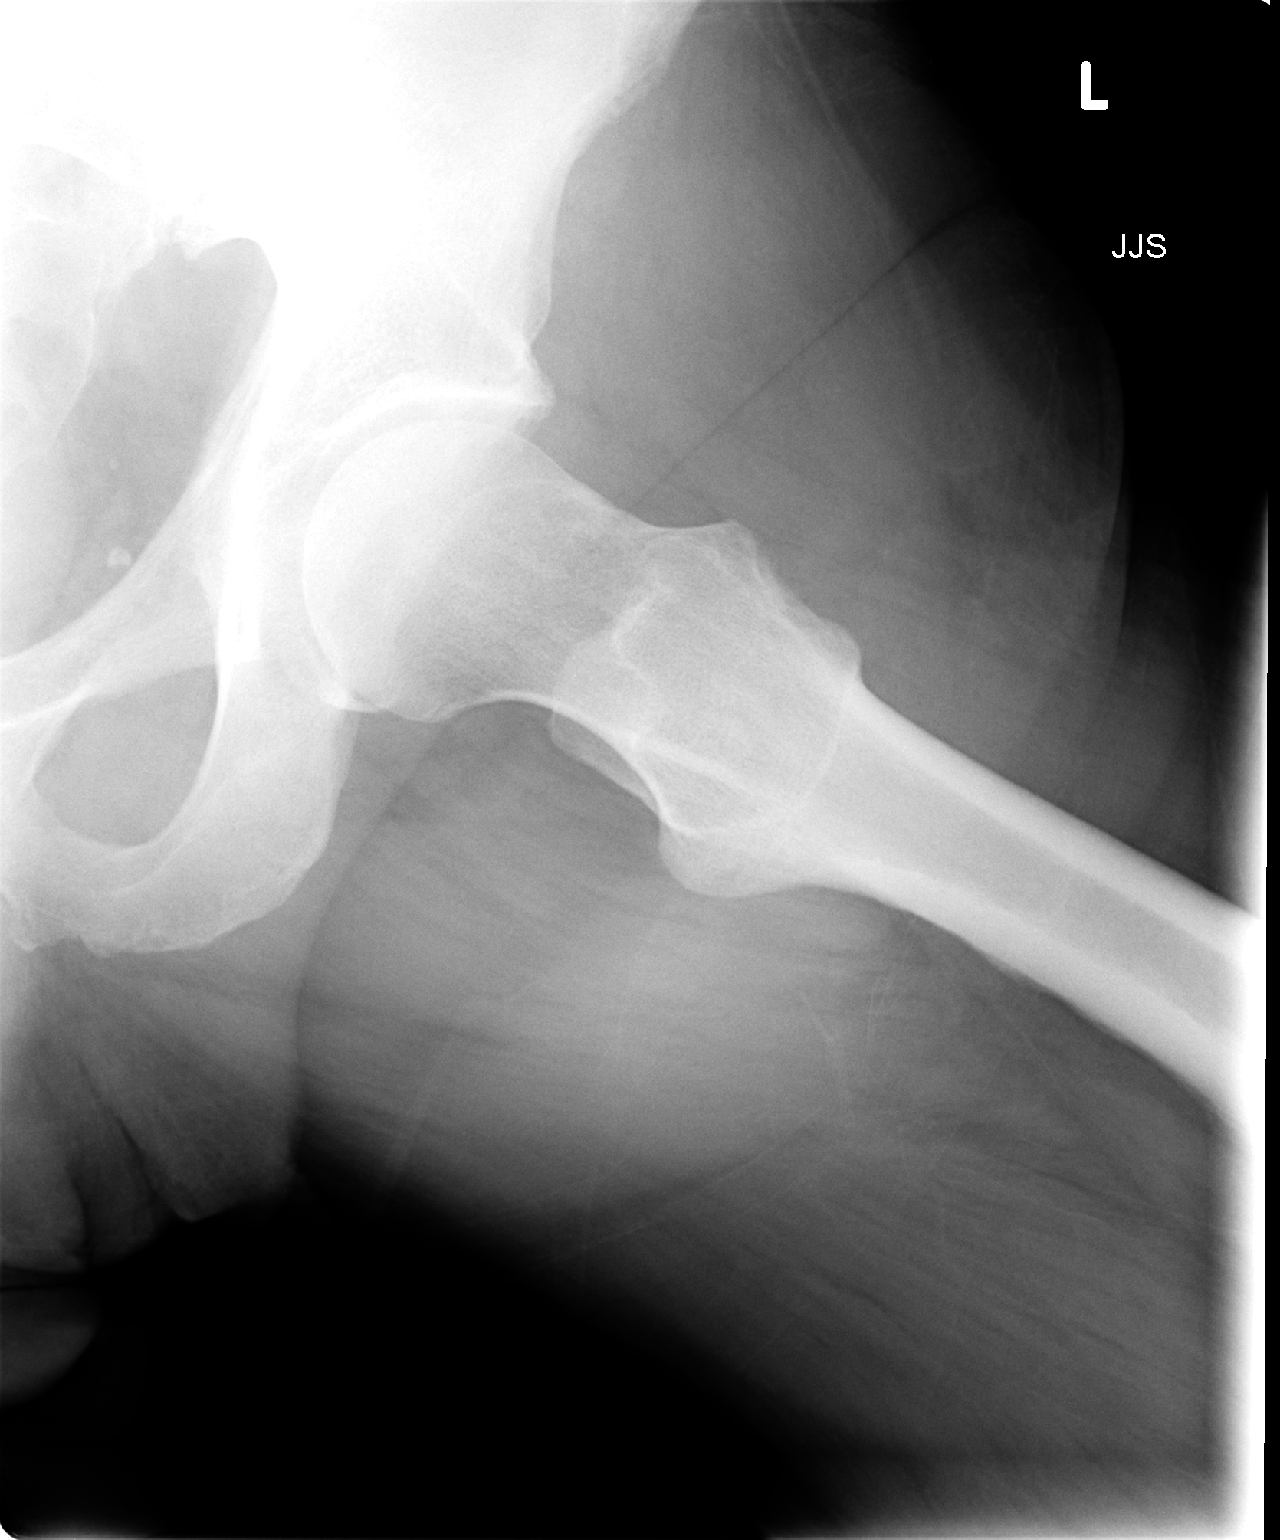

[3 of 3 positions shown; findings below may reference images not displayed]

FINDINGS: Hips are located.  There is no fracture.  Mild and
symmetric appearing hip degenerative change is noted.
IMPRESSION: 1.  Mild appearing hip degenerative disease.  Otherwise negative.

## 2009-03-17 ENCOUNTER — Telehealth (INDEPENDENT_AMBULATORY_CARE_PROVIDER_SITE_OTHER): Payer: Self-pay | Admitting: *Deleted

## 2009-03-31 ENCOUNTER — Ambulatory Visit: Payer: Self-pay | Admitting: Internal Medicine

## 2009-03-31 DIAGNOSIS — D649 Anemia, unspecified: Secondary | ICD-10-CM | POA: Insufficient documentation

## 2009-04-03 LAB — CONVERTED CEMR LAB
BUN: 19 mg/dL (ref 6–23)
Calcium: 9.4 mg/dL (ref 8.4–10.5)
Chloride: 104 meq/L (ref 96–112)
Creatinine,U: 94.1 mg/dL
GFR calc non Af Amer: 53.35 mL/min (ref >60)
Glucose, Bld: 87 mg/dL (ref 70–99)
HDL: 42 mg/dL (ref >39.00)
Microalb Creat Ratio: 4.3 mg/g (ref 0.0–30.0)
Microalb, Ur: 0.4 mg/dL (ref 0.0–1.9)
Total CHOL/HDL Ratio: 3
VLDL: 17.6 mg/dL (ref 0.0–40.0)

## 2009-07-12 ENCOUNTER — Telehealth: Payer: Self-pay | Admitting: Internal Medicine

## 2009-07-25 ENCOUNTER — Ambulatory Visit: Payer: Self-pay | Admitting: Internal Medicine

## 2009-07-25 DIAGNOSIS — R221 Localized swelling, mass and lump, neck: Secondary | ICD-10-CM

## 2009-07-25 DIAGNOSIS — R220 Localized swelling, mass and lump, head: Secondary | ICD-10-CM | POA: Insufficient documentation

## 2009-07-26 ENCOUNTER — Telehealth (INDEPENDENT_AMBULATORY_CARE_PROVIDER_SITE_OTHER): Payer: Self-pay | Admitting: *Deleted

## 2009-07-26 ENCOUNTER — Telehealth: Payer: Self-pay | Admitting: Internal Medicine

## 2009-07-26 LAB — CONVERTED CEMR LAB
Calcium: 9.9 mg/dL (ref 8.4–10.5)
Chloride: 101 meq/L (ref 96–112)
GFR calc non Af Amer: 53.74 mL/min (ref >60)
Glucose, Bld: 103 mg/dL — ABNORMAL HIGH (ref 70–99)

## 2009-08-03 ENCOUNTER — Encounter: Payer: Self-pay | Admitting: Internal Medicine

## 2009-08-08 ENCOUNTER — Encounter: Payer: Self-pay | Admitting: Internal Medicine

## 2009-09-01 DIAGNOSIS — K279 Peptic ulcer, site unspecified, unspecified as acute or chronic, without hemorrhage or perforation: Secondary | ICD-10-CM

## 2009-09-01 HISTORY — DX: Peptic ulcer, site unspecified, unspecified as acute or chronic, without hemorrhage or perforation: K27.9

## 2009-09-12 ENCOUNTER — Encounter (INDEPENDENT_AMBULATORY_CARE_PROVIDER_SITE_OTHER): Payer: Self-pay | Admitting: *Deleted

## 2009-09-12 ENCOUNTER — Ambulatory Visit: Payer: Self-pay | Admitting: Gastroenterology

## 2009-09-15 ENCOUNTER — Ambulatory Visit: Payer: Self-pay | Admitting: Gastroenterology

## 2009-09-20 ENCOUNTER — Encounter: Payer: Self-pay | Admitting: Gastroenterology

## 2009-11-07 ENCOUNTER — Telehealth: Payer: Self-pay | Admitting: Internal Medicine

## 2009-12-06 ENCOUNTER — Ambulatory Visit: Payer: Self-pay | Admitting: Internal Medicine

## 2009-12-29 ENCOUNTER — Ambulatory Visit: Payer: Self-pay | Admitting: Cardiology

## 2010-01-15 ENCOUNTER — Telehealth: Payer: Self-pay | Admitting: Internal Medicine

## 2010-01-15 ENCOUNTER — Ambulatory Visit: Payer: Self-pay | Admitting: Internal Medicine

## 2010-01-22 LAB — CONVERTED CEMR LAB
AST: 25 units/L (ref 0–37)
Albumin: 4.6 g/dL (ref 3.5–5.2)
Alkaline Phosphatase: 60 units/L (ref 39–117)
BUN: 25 mg/dL — ABNORMAL HIGH (ref 6–23)
Basophils Absolute: 0.1 10*3/uL (ref 0.0–0.1)
Creatinine, Ser: 1.6 mg/dL — ABNORMAL HIGH (ref 0.4–1.5)
Glucose, Bld: 97 mg/dL (ref 70–99)
HCT: 45.3 % (ref 39.0–52.0)
Hemoglobin: 15.8 g/dL (ref 13.0–17.0)
Lymphocytes Relative: 18.2 % (ref 12.0–46.0)
MCHC: 34.9 g/dL (ref 30.0–36.0)
MCV: 97.5 fL (ref 78.0–100.0)
Monocytes Absolute: 0.8 10*3/uL (ref 0.1–1.0)
Neutro Abs: 6.2 10*3/uL (ref 1.4–7.7)
Platelets: 124 10*3/uL — ABNORMAL LOW (ref 150.0–400.0)
Potassium: 4.7 meq/L (ref 3.5–5.1)
RBC: 4.64 M/uL (ref 4.22–5.81)
RDW: 12.3 % (ref 11.5–14.6)
Total Bilirubin: 1.1 mg/dL (ref 0.3–1.2)
Total Protein: 7.2 g/dL (ref 6.0–8.3)

## 2010-01-23 ENCOUNTER — Encounter: Payer: Self-pay | Admitting: Internal Medicine

## 2010-03-12 ENCOUNTER — Telehealth: Payer: Self-pay | Admitting: Internal Medicine

## 2010-03-25 ENCOUNTER — Encounter: Payer: Self-pay | Admitting: Internal Medicine

## 2010-04-01 LAB — CONVERTED CEMR LAB
ALT: 21 units/L (ref 0–53)
ALT: 25 units/L
AST: 19 units/L
BUN: 13 mg/dL
Basophils Absolute: 0.1 10*3/uL (ref 0.0–0.1)
Basophils Relative: 0.9 % (ref 0.0–3.0)
Basophils Relative: 1.1 % (ref 0.0–3.0)
Calcium: 9.3 mg/dL (ref 8.4–10.5)
Chloride: 103 meq/L (ref 96–112)
Cholesterol: 104 mg/dL (ref 0–200)
Cholesterol: 114 mg/dL (ref 0–200)
Eosinophils Absolute: 0.9 10*3/uL — ABNORMAL HIGH (ref 0.0–0.7)
Eosinophils Relative: 9.2 % — ABNORMAL HIGH (ref 0.0–5.0)
FSH: 4 milliintl units/mL
GFR calc Af Amer: 78 mL/min
Glucose, Bld: 102 mg/dL — ABNORMAL HIGH (ref 70–99)
Glucose, Bld: 142 mg/dL
Glucose, Urine, Semiquant: NEGATIVE
HCT: 45.6 % (ref 39.0–52.0)
HCT: 48.8 % (ref 39.0–52.0)
HDL: 30.4 mg/dL — ABNORMAL LOW (ref >39.0)
Hemoglobin: 15.7 g/dL (ref 13.0–17.0)
Ketones, urine, test strip: NEGATIVE
LDL Cholesterol: 63 mg/dL (ref 0–99)
Lymphocytes Relative: 18.8 % (ref 12.0–46.0)
Monocytes Absolute: 0.7 10*3/uL (ref 0.1–1.0)
Neutro Abs: 5.1 10*3/uL (ref 1.4–7.7)
Nitrite: NEGATIVE
Potassium: 4.3 meq/L (ref 3.5–5.1)
Sodium: 138 meq/L (ref 135–145)
Specific Gravity, Urine: <1.005
TSH: 0.88 microintl units/mL (ref 0.35–5.50)
Testosterone: 334.09 ng/dL — ABNORMAL LOW (ref 350.00–890)
Total CHOL/HDL Ratio: 3.4
Total CHOL/HDL Ratio: 3.6
Total Protein: 7.1 g/dL
Triglycerides: 80 mg/dL (ref 0–149)
Urobilinogen, UA: 0.2
VLDL: 16 mg/dL (ref 0–40)
WBC, blood: 6.5 10*3/uL
WBC: 7.9 10*3/uL (ref 4.5–10.5)
WBC: 8.3 10*3/uL (ref 4.5–10.5)

## 2010-04-05 ENCOUNTER — Telehealth (INDEPENDENT_AMBULATORY_CARE_PROVIDER_SITE_OTHER): Payer: Self-pay | Admitting: *Deleted

## 2010-04-05 NOTE — Miscellaneous (Signed)
Summary: Orders Update   Clinical Lists Changes  Orders: Added new Referral order of ENT Referral (ENT) - Signed 

## 2010-04-05 NOTE — Procedures (Signed)
Summary: Upper Endoscopy  Patient: [name] Little Note: All result statuses are Final unless otherwise noted.  Tests: (1) Upper Endoscopy (EGD)   EGD Upper Endoscopy       DONE     [facility]     [address]     [place], [place]  [address]           ENDOSCOPY PROCEDURE REPORT           PATIENT:  [name], Little  MR#:  [id number]     BIRTHDATE:  [birth year], 68 yrs. old  GENDER:  male     ENDOSCOPIST:  Rachael Fee, MD     Referred by:  Brynda Peon, M.D.     PROCEDURE DATE:  09/15/2009     PROCEDURE:  EGD with biopsy, EGD with balloon dilatation, EGD for     control of bleeding     ASA CLASS:  Class II     INDICATIONS:  dysphagia (? esophageal)     MEDICATIONS:  Fentanyl 75 mcg IV, Versed 7 mg IV     TOPICAL ANESTHETIC:  Exactacain Spray           DESCRIPTION OF PROCEDURE:   After the risks benefits and     alternatives of the procedure were thoroughly explained, informed     consent was obtained.  The [facility] [id number] endoscope was     introduced through the mouth and advanced to the second portion of     the duodenum, without limitations.  The instrument was slowly     withdrawn as the mucosa was fully examined.     <<PROCEDUREIMAGES>>     There was a mild, peptic appearing stricture at GE junction. This     did not restrict scope passage however given his symptoms, it was     dilated up to 20mm with a CRE TTS balloon held inflated for 60     seconds. There was typical appearing superficial mucosal tear but     more than usual post dilation oozing that was treated with     injection of 2cc dilute epinephrine. Hemostasis was acheieved (see     image1, image7, and image8). There were several small pre-pyloric     ulcers. All were clean based, benign appearing however biopsies     were taken to check for H. pylori, neoplasm (see image5).     Otherwise the examination was normal (see image4).    Retroflexed     views revealed no abnormalities.    The scope  was then withdrawn     from the patient and the procedure completed.     COMPLICATIONS:  None           ENDOSCOPIC IMPRESSION:     1) Minor, peptic appearing GE junction stricture.  Dilated with     CRE balloon. There was more than usual post dilation oozing which     was treated with dilute epinephrine injection     2) Several small pre-pyloric ulcers, clean based. Biopsies taken     to check for  H. pylori     3) Otherwise normal examination           RECOMMENDATIONS:     If biopsies show H. pylori, he will be treated with appropriate     antibiotics.  He will need repeat EGD in 2-3 months to check for     ulcer healing.     If swallowing problems do not  change after this dilation, he     will be set up with speech MBSS (symptoms are not clearly     esophageal in nature).           ______________________________     Rachael Fee, MD           n.     eSIGNED:   Rachael Fee at 09/15/2009 09:01 AM           Wandra Feinstein, [id number]  Note: An exclamation mark (!) indicates a result that was not dispersed into the flowsheet. Document Creation Date: 09/15/2009 9:01 AM _______________________________________________________________________  (1) Order result status: Final Collection or observation date-time: 09/15/2009 08:49 Requested date-time:  Receipt date-time:  Reported date-time:  Referring Physician:   Ordering Physician: Rob Bunting [phone]) Specimen Source:  Source: Launa Grill Order Number: [id number] Lab site:

## 2010-04-05 NOTE — Assessment & Plan Note (Signed)
Summary: yearly /NS/KDC   Vital Signs:  Patient profile:   70 year old male Height:      70 inches Weight:      189.4 pounds BMI:     27.27 Pulse rate:   64 / minute BP sitting:   120 / 70  Vitals Entered By: [name] (March 31, 2009 8:15 AM) CC: rov - fasting   History of Present Illness: yearly exam, chart reviewed  Anxiety-depression ----> symptoms stable, better   Hypertension-- no ambulatory BPs but good medication compliance   Borderline DM-- does watch diet  GERD--not on zantac, using "mustard and vinegar" and humming  CAD-- note from cards reviewed,suggested  a goal of  LDL less than 80 and HDL in the 40s.    Current Medications (verified): 1)  Maxzide-25 37.5-25 Mg  Tabs (Triamterene-Hctz) .... Half P.o. Every Morning For One Week, Then Take One Every Morning 2)  Enalapril Maleate 5 Mg  Tabs (Enalapril Maleate) .... One Two Times A Day 3)  Metoprolol Tartrate 100 Mg Tabs (Metoprolol Tartrate) .Marland Kitchen.. 1 Tablet By Mouth Twice A Day 4)  Niaspan 750 Mg  Tbcr (Niacin (Antihyperlipidemic)) .... Take 2 Tablets At Bedtime 5)  Simvastatin 40 Mg Tabs (Simvastatin) .... 2po Qhs 6)  Aspirin  Tabs (Aspirin Tabs) .... 2 Daily 7)  Coq10  Caps (Coenzyme Q10 Caps) 8)  Fish Oil  Caps (Omega-3 Fatty Acids Caps) .... 1000mg  3 Qd 9)  Multivitamins  Tabs (Multiple Vitamin) .... 2 Daily 10)  Alprazolam 0.5 Mg Tabs (Alprazolam) .Marland Kitchen.. 1-2 Tablets Daily Single Dose  Allergies (verified): 1)  Enalapril Maleate (Enalapril Maleate) 2)  Micardis (Telmisartan)  Past History:  Past Medical History: Anxiety-depression Hyperlipidemia Hypertension Borderline DM GERD Headache - s/p neuro eval ? migraine ( 06/2003) CAD   COLONIC POLYPS   IRON DEFICIENCY   Cscope 10-08 ARTHRITIS   Ischemic cardiomyopathy (EF had been 30-35%.  The most recent EF     was 50% on echo in September 2009).  Past Surgical History: Reviewed history from 12/28/2008 and no changes required. Coronary  artery bypass graft 2004(LIMA to the LADs,     sequential SVG to intermediate and obtuse marginal, sequential SVG     to PDA and posterolateral). Inguinal herniorrhaphy  Family History: Reviewed history from 11/19/2008 and no changes required. DM--father colon ca--no prostate ca--no MI--M and F  Social History: Married lives w/ wife no children tobacco-- quit in the 58s ETOH-- no diet--does watch exercise-- [facility] [employer]  Review of Systems CV:  Denies chest pain or discomfort and swelling of feet. Resp:  Denies shortness of breath; some cough and RN x few day no fever. GI:  Denies bloody stools, nausea, and vomiting; rarely has diarrhea . GU:  Denies hematuria, urinary frequency, and urinary hesitancy.  Physical Exam  General:  alert and well-developed.   Lungs:  normal respiratory effort, no intercostal retractions, no accessory muscle use, and normal breath sounds.   Heart:  normal rate, regular rhythm, and no murmur.   Abdomen:  soft, non-tender, no masses, no guarding, and no rigidity.  no bruit Rectal:  No external abnormalities noted. Normal sphincter tone. No rectal masses or tenderness. Prostate:  Prostate gland firm and smooth, no enlargement, nodularity, tenderness, mass, asymmetry or induration. Extremities:  no edema Psych:  Oriented X3, normally interactive, good eye contact, not anxious appearing, and not depressed appearing.     Impression & Recommendations:  Problem # 1:  HYPERTENSION (ICD-401.9) at goal  His updated medication list for this problem includes:    Maxzide-25 37.5-25 Mg Tabs (Triamterene-hctz) ..... Half p.o. every morning for one week, then take one every morning    Enalapril Maleate 5 Mg Tabs (Enalapril maleate) ..... One two times a day    Metoprolol Tartrate 100 Mg Tabs (Metoprolol tartrate) .Marland Kitchen... 1 tablet by mouth twice a day  Orders: TLB-BMP (Basic Metabolic Panel-BMET) (80048-METABOL)  BP today: 120/70 Prior BP: 121/67  (12/28/2008)  Labs Reviewed: K+: 4.7 (12/13/2008) Creat: : 1.4 (11/15/2008)   Chol: 104 (02/04/2008)   HDL: 30.4 (02/04/2008)   LDL: 58 (02/04/2008)   TG: 80 (02/04/2008)  Problem # 2:  HEALTH SCREENING (ICD-V70.0) Td 09 pneumonia shot 2005 had a flu shot   colonoscopy 03/2006, 2 hyperplastic polyps, Next 10 years check a PSA continue healthy lifestyle, printed material provided regards  diet  Problem # 3:  CAD (ICD-414.00) asymptomatic at present His updated medication list for this problem includes:    Maxzide-25 37.5-25 Mg Tabs (Triamterene-hctz) ..... Half p.o. every morning for one week, then take one every morning    Enalapril Maleate 5 Mg Tabs (Enalapril maleate) ..... One two times a day    Metoprolol Tartrate 100 Mg Tabs (Metoprolol tartrate) .Marland Kitchen... 1 tablet by mouth twice a day    Aspirin Tabs (Aspirin tabs) .Marland Kitchen... 2 daily  Problem # 4:  HYPERLIPIDEMIA (ICD-272.4) goal of  LDL less than 80 and HDL in the 40s.  labs His updated medication list for this problem includes:    Niaspan 750 Mg Tbcr (Niacin (antihyperlipidemic)) .Marland Kitchen... Take 2 tablets at bedtime    Simvastatin 40 Mg Tabs (Simvastatin) .Marland Kitchen... 2po qhs  Orders: TLB-Lipid Panel (80061-LIPID) TLB-ALT (SGPT) (84460-ALT) TLB-AST (SGOT) (84450-SGOT)  Problem # 5:  ANXIETY (ICD-300.00) stable, improved His updated medication list for this problem includes:    Alprazolam 0.5 Mg Tabs (Alprazolam) .Marland Kitchen... 1-2 tablets daily single dose  Problem # 6:  GERD (ICD-530.81) has some Zantac at home but hardly ever uses, using  some unconventional treatment to prevent GERD, see HPI advise patient to go back to Zantac if symptoms increase and to call if symptoms are severe His updated medication list for this problem includes:    Zantac 150 Mg Tabs (Ranitidine hcl) ..... One daily as needed  Complete Medication List: 1)  Maxzide-25 37.5-25 Mg Tabs (Triamterene-hctz) .... Half p.o. every morning for one week, then take one every  morning 2)  Enalapril Maleate 5 Mg Tabs (Enalapril maleate) .... One two times a day 3)  Metoprolol Tartrate 100 Mg Tabs (Metoprolol tartrate) .Marland Kitchen.. 1 tablet by mouth twice a day 4)  Niaspan 750 Mg Tbcr (Niacin (antihyperlipidemic)) .... Take 2 tablets at bedtime 5)  Simvastatin 40 Mg Tabs (Simvastatin) .... 2po qhs 6)  Aspirin Tabs (Aspirin tabs) .... 2 daily 7)  Coq10 Caps (Coenzyme q10 caps) 8)  Fish Oil Caps (Omega-3 fatty acids caps) .... 1000mg  3 qd 9)  Multivitamins Tabs (Multiple vitamin) .... 2 daily 10)  Alprazolam 0.5 Mg Tabs (Alprazolam) .Marland Kitchen.. 1-2 tablets daily single dose 11)  Zantac 150 Mg Tabs (Ranitidine hcl) .... One daily as needed  Other Orders: Venipuncture ([address]) TLB-A1C / Hgb A1C (Glycohemoglobin) (83036-A1C) TLB-Microalbumin/Creat Ratio, Urine (82043-MALB) TLB-PSA (Prostate Specific Antigen) (84153-PSA) Pneumococcal Vaccine (60454) Admin 1st Vaccine (09811) Admin 1st Vaccine [facility]) [phone])  Patient Instructions: 1)  Please schedule a follow-up appointment in 4 to 5 months .    Pneumovax Vaccine    Vaccine Type: Pneumovax (Medicare)  Site: right deltoid    Dose: 0.5 ml    Route: IM    Given by: [name]    Exp. Date: 03/30/2010    Lot #: [id number]

## 2010-04-05 NOTE — Assessment & Plan Note (Signed)
Summary: 1 yr rov 414.01  pfh,rn  Medications Added FLAX SEED OIL 1000 MG CAPS (FLAXSEED (LINSEED)) 2  by mouth daily      Allergies Added:   Visit Type:  Follow-up Primary Provider:  Nolon Rod. Paz MD  CC:  CAD.  History of Present Illness: The patient present for one year followup. Since I last saw him he has had no new cardiovascular problems. He has had some GI issues. He also reports some anxiety and decreased sleep still related to the stress of her job he no longer works. He is active doing yard work. With this he denies any chest pressure, neck or arm discomfort. He has no palpitations, presyncope or syncope. He has no shortness of breath, PND or orthopnea.  He has no weight gain or edema.  Current Medications (verified): 1)  Maxzide-25 37.5-25 Mg  Tabs (Triamterene-Hctz) .... Half P.o. Every Morning For One Week, Then Take One Every Morning 2)  Enalapril Maleate 5 Mg  Tabs (Enalapril Maleate) .... One Two Times A Day 3)  Metoprolol Tartrate 100 Mg Tabs (Metoprolol Tartrate) .Marland Kitchen.. 1 Tablet By Mouth Twice A Day 4)  Niaspan 750 Mg  Tbcr (Niacin (Antihyperlipidemic)) .... Take 2 Tablets At Bedtime 5)  Simvastatin 40 Mg Tabs (Simvastatin) .... 2po Qhs 6)  Aspirin  Tabs (Aspirin Tabs) .... 2 Daily 7)  Coq10  Caps (Coenzyme Q10 Caps) 8)  Fish Oil  Caps (Omega-3 Fatty Acids Caps) .... 1200mg  3 Qd 9)  Multivitamins  Tabs (Multiple Vitamin) .... 2 Daily 10)  Alprazolam 0.5 Mg Tabs (Alprazolam) .Marland Kitchen.. 1-2 Tablets Daily Single Dose 11)  Flax Seed Oil 1000 Mg Caps (Flaxseed (Linseed)) .... 2  By Mouth Daily  Allergies (verified): 1)  Enalapril Maleate (Enalapril Maleate) 2)  Micardis (Telmisartan)  Past History:  Past Medical History: Reviewed history from 09/12/2009 and no changes required. Anxiety-depression Hyperlipidemia Hypertension Borderline DM GERD Headache - s/p neuro eval ? migraine ( 06/2003) CAD   hyperplastic colon polyps 2008 IRON DEFICIENCY   ARTHRITIS   Ischemic  cardiomyopathy (EF had been 30-35%.  The most recent EF  was 50% on echo in September 2009).  Past Surgical History: Coronary artery bypass graft 2004(LIMA to the LADs,  sequential SVG to intermediate and obtuse marginal, sequential SVG  to PDA and posterolateral). Inguinal herniorrhaphy    Review of Systems       As stated in the HPI and negative for all other systems.   Vital Signs:  Patient profile:   70 year old male Height:      70 inches Weight:      190 pounds BMI:     27.36 Pulse rate:   57 / minute Resp:     16 per minute BP sitting:   120 / 80  (right arm)  Vitals Entered By: Marrion Coy, CNA (December 29, 2009 12:39 PM)  Physical Exam  General:  Well developed, well nourished, in no acute distress. Head:  normocephalic and atraumatic Eyes:  PERRLA/EOM intact; conjunctiva and lids normal. Neck:  Neck supple, no JVD. No masses, thyromegaly or abnormal cervical nodes. Chest Wall:  no deformities or breast masses noted Lungs:  Clear bilaterally to auscultation and percussion. Heart:  Non-displaced PMI, chest non-tender; regular rate and rhythm, S1, S2 without murmurs, rubs or gallops. Carotid upstroke normal, no bruit. Normal abdominal aortic size, no bruits. Femorals normal pulses, no bruits. Pedals normal pulses. No edema, no varicosities. Abdomen:  Bowel sounds positive; abdomen soft and non-tender without  masses, organomegaly, or hernias noted. No hepatosplenomegaly. Msk:  Back normal, normal gait. Muscle strength and tone normal. Extremities:  No clubbing or cyanosis. Neurologic:  Resting tremor Skin:  Intact without lesions or rashes. Cervical Nodes:  no significant adenopathy Psych:  Slightly anxious but pleasant   EKG  Procedure date:  12/29/2009  Findings:      Sinus bradycardia, rate 59, poor anterior R-wave progression, old anteroseptal infarct, no acute ST-T wave changes  Impression & Recommendations:  Problem # 1:  CAD (ICD-414.00) The patient  has no new symptoms. No further cardiovascular testing is suggested. We will continue with risk reduction. Orders: EKG w/ Interpretation (93000)  Problem # 2:  HYPERTENSION (ICD-401.9) His blood pressure is controlled. We will continue with the meds as listed.  Problem # 3:  HYPERLIPIDEMIA (ICD-272.4) I will review his lipids from last year and he had an excellent profile. We will be continuing the meds as listed. He is due to have this checked again in January.  Patient Instructions: 1)  Your physician recommends that you schedule a follow-up appointment in: 1 year with Dr Antoine Poche 2)  Your physician recommends that you continue on your current medications as directed. Please refer to the Current Medication list given to you today.

## 2010-04-05 NOTE — Assessment & Plan Note (Signed)
Summary: 4 MTH FU/NS/KDC   Vital Signs:  Patient profile:   70 year old male Height:      70 inches Weight:      189 pounds BMI:     27.22 Pulse rate:   70 / minute BP sitting:   110 / 80  Vitals Entered By: [name] (Jul 25, 2009 9:56 AM) CC: rov, not fasting, would like to discuss GERD sxs   History of Present Illness: ROV Hypertension-- ambulatory BPs in the 110-120/70s  Borderline DM--  diet described as  healthy   GERD-- the patient reports a long history of choking with solids follow up by hicups, does all his symptoms he refers as "GERD", no classic heartburn. He feels that the solid irritated his upper throat, no actual odynophagia or dysphasia feeling distal from the throat. No associated cough. He also figured out that by taking his night time medications after his meals, he is essentially symptom-free  Also complained of a  "gland" in the neck that is chronically larger than the other side, see physical exam  Current Medications (verified): 1)  Maxzide-25 37.5-25 Mg  Tabs (Triamterene-Hctz) .... Half P.o. Every Morning For One Week, Then Take One Every Morning 2)  Enalapril Maleate 5 Mg  Tabs (Enalapril Maleate) .... One Two Times A Day 3)  Metoprolol Tartrate 100 Mg Tabs (Metoprolol Tartrate) .Marland Kitchen.. 1 Tablet By Mouth Twice A Day 4)  Niaspan 750 Mg  Tbcr (Niacin (Antihyperlipidemic)) .... Take 2 Tablets At Bedtime 5)  Simvastatin 40 Mg Tabs (Simvastatin) .... 2po Qhs 6)  Aspirin  Tabs (Aspirin Tabs) .... 2 Daily 7)  Coq10  Caps (Coenzyme Q10 Caps) 8)  Fish Oil  Caps (Omega-3 Fatty Acids Caps) .... 1200mg  3 Qd 9)  Multivitamins  Tabs (Multiple Vitamin) .... 2 Daily 10)  Alprazolam 0.5 Mg Tabs (Alprazolam) .Marland Kitchen.. 1-2 Tablets Daily Single Dose 11)  Zantac 150 Mg Tabs (Ranitidine Hcl) .... One Daily As Needed  Allergies (verified): 1)  Enalapril Maleate (Enalapril Maleate) 2)  Micardis (Telmisartan)  Past History:  Past Medical  History: Anxiety-depression Hyperlipidemia Hypertension Borderline DM GERD Headache - s/p neuro eval ? migraine ( 06/2003) CAD   COLONIC POLYPS   IRON DEFICIENCY   Cscope 10-08 ARTHRITIS   Ischemic cardiomyopathy (EF had been 30-35%.  The most recent EF  was 50% on echo in September 2009).  Past Surgical History: Coronary artery bypass graft 2004(LIMA to the LADs,  sequential SVG to intermediate and obtuse marginal, equential SVG  to PDA and posterolateral). Inguinal herniorrhaphy  Review of Systems CV:  Denies chest pain or discomfort and swelling of feet. Resp:  Denies cough and wheezing. GI:  no nausea, vomiting, abdominal pain,  blood in the stools.  Physical Exam  General:  alert and well-developed.   Neck:  right submandibular mass ( consistent with a salivary gland) larger than the left. No fluctuant, no adhesion to deeper structures, no supraclavicular mass or nodules Lungs:  normal respiratory effort, no intercostal retractions, no accessory muscle use, and normal breath sounds.   Heart:  normal rate, regular rhythm, and no murmur.   Abdomen:  soft and non-tender.   Extremities:  no edema   Impression & Recommendations:  Problem # 1:  HYPERTENSION (ICD-401.9) at goal, no change His updated medication list for this problem includes:    Maxzide-25 37.5-25 Mg Tabs (Triamterene-hctz) ..... Half p.o. every morning for one week, then take one every morning    Enalapril Maleate 5 Mg Tabs (  Enalapril maleate) ..... One two times a day    Metoprolol Tartrate 100 Mg Tabs (Metoprolol tartrate) .Marland Kitchen... 1 tablet by mouth twice a day  BP today: 110/80 Prior BP: 120/70 (03/31/2009)  Labs Reviewed: K+: 4.4 (03/31/2009) Creat: : 1.4 (03/31/2009)   Chol: 107 (03/31/2009)   HDL: 42.00 (03/31/2009)   LDL: 47 (03/31/2009)   TG: 88.0 (03/31/2009)  Orders: Venipuncture ([address]) TLB-BMP (Basic Metabolic Panel-BMET) (80048-METABOL)  Problem # 2:  HYPERLIPIDEMIA (ICD-272.4) at goal   His updated medication list for this problem includes:    Niaspan 750 Mg Tbcr (Niacin (antihyperlipidemic)) .Marland Kitchen... Take 2 tablets at bedtime    Simvastatin 40 Mg Tabs (Simvastatin) .Marland Kitchen... 2po qhs  Labs Reviewed: SGOT: 25 (03/31/2009)   SGPT: 26 (03/31/2009)   HDL:42.00 (03/31/2009), 30.4 (02/04/2008)  LDL:47 (03/31/2009), 58 (02/04/2008)  Chol:107 (03/31/2009), 104 (02/04/2008)  Trig:88.0 (03/31/2009), 80 (02/04/2008)  Problem # 3:  DIABETES MELLITUS, MILD (ICD-250.00) due for labs His updated medication list for this problem includes:    Enalapril Maleate 5 Mg Tabs (Enalapril maleate) ..... One two times a day    Aspirin Tabs (Aspirin tabs) .Marland Kitchen... 2 daily  Labs Reviewed: Creat: 1.4 (03/31/2009)    Reviewed HgBA1c results: 5.9 (03/31/2009)  5.7 (11/15/2008)  Orders: TLB-A1C / Hgb A1C (Glycohemoglobin) (83036-A1C)  Problem # 4:  GERD (ICD-530.81) see   history of present illness,  we spent a great deal of time talking about his symptoms that are certainly atypical for GERD. he reports a previously normal swallowing test Nevertheless, his symptoms are much better when he separate his dinner from medications. Plan: Take medications after dinner, call if symptoms increase His updated medication list for this problem includes:    Zantac 150 Mg Tabs (Ranitidine hcl) ..... One daily as needed  Problem # 5:  NECK MASS (ICD-784.2)  asymmetric mass at the submandibular  area, suspect it's the salivary gland plan: Check a ultrasound  Orders: Radiology Referral (Radiology)  Complete Medication List: 1)  Maxzide-25 37.5-25 Mg Tabs (Triamterene-hctz) .... Half p.o. every morning for one week, then take one every morning 2)  Enalapril Maleate 5 Mg Tabs (Enalapril maleate) .... One two times a day 3)  Metoprolol Tartrate 100 Mg Tabs (Metoprolol tartrate) .Marland Kitchen.. 1 tablet by mouth twice a day 4)  Niaspan 750 Mg Tbcr (Niacin (antihyperlipidemic)) .... Take 2 tablets at bedtime 5)  Simvastatin 40  Mg Tabs (Simvastatin) .... 2po qhs 6)  Aspirin Tabs (Aspirin tabs) .... 2 daily 7)  Coq10 Caps (Coenzyme q10 caps) 8)  Fish Oil Caps (Omega-3 fatty acids caps) .... 1200mg  3 qd 9)  Multivitamins Tabs (Multiple vitamin) .... 2 daily 10)  Alprazolam 0.5 Mg Tabs (Alprazolam) .Marland Kitchen.. 1-2 tablets daily single dose 11)  Zantac 150 Mg Tabs (Ranitidine hcl) .... One daily as needed  Patient Instructions: 1)  Please schedule a follow-up appointment in 4 to 5 months .

## 2010-04-05 NOTE — Progress Notes (Signed)
Summary: refill  Phone Note Refill Request Message from:  Fax from Pharmacy on January 15, 2010 4:08 PM  Refills Requested: Medication #1:  ALPRAZOLAM 0.5 MG TABS 1-2 tablets daily single dose [facility] [address] - fax [phone]  Initial call taken by: Okey Regal [place],  January 15, 2010 4:09 PM  Follow-up for Phone Call        60, one refill Follow-up by: [facility] E. Paz MD,  January 15, 2010 5:13 PM    Prescriptions: ALPRAZOLAM 0.5 MG TABS (ALPRAZOLAM) 1-2 tablets daily single dose  #60 x 2   Entered by:   Army Fossa CMA   Authorized by:   Nolon Rod. Paz MD   Signed by:   Army Fossa CMA on 01/16/2010   Method used:   Printed then faxed to ...       [facility] [address] (retail)       [address]       [place], [place]  [address]       Ph: [phone]       Fax: [phone]   RxID:   [id number]   Appended Document: refill Pharmacy called to verify rx, I informed them per Dr.Paz #60/ 1 refill

## 2010-04-05 NOTE — Progress Notes (Signed)
Summary: Refill Request  Phone Note Refill Request Call back at [phone] Message from:  Pharmacy on March 12, 2010 8:34 AM  Refills Requested: Medication #1:  ALPRAZOLAM 0.5 MG TABS 1-2 tablets daily single dose   Dosage confirmed as above?Dosage Confirmed   Supply Requested: 60   Last Refilled: [facility]/15/2010 [facility] [address]  Next Appointment Scheduled: none Initial call taken by: Harold Barban,  March 12, 2010 8:36 AM  Follow-up for Phone Call        denied, has enough until 04-18-2010 Follow-up by: [facility] E. Gorden Stthomas MD,  March 12, 2010 10:29 AM  Additional Follow-up for Phone Call Additional follow up Details #1::        Spoke w/ pharmacist he states on 01/20/10 they have on file they had to call us to get refill verfication because refill did not go through. He states Chrae gave # 60 with 1 refill. Since we originally sent in #60 w/ 2 refills. The Pharmacy will fill for 1 month. Army Fossa CMA  March 12, 2010 11:15 AM

## 2010-04-05 NOTE — Letter (Signed)
Summary: Results Letter  [facility]  [address] [place], [place] [address]   Phone: [phone]  Fax: [phone]        September 20, 2009 MRN: [id number]    [name] Little [address] [place], [place]  [address]    Dear Mr. SANTANA,   The biopsies taken during your recent EGD showed no infection or cancer.  You should continue to follow the recommendations that we discussed at the time of your procedure.  Let me know if your swallowing problems return.  Please feel free to call if you have any further questions or concerns.      Sincerely,  Rachael Fee MD  This letter has been electronically signed by your physician.  Appended Document: Results Letter letter mailed.

## 2010-04-05 NOTE — Assessment & Plan Note (Signed)
Summary: chills-dirrehea/cbs   Vital Signs:  Patient profile:   70 year old male Weight:      186.6 pounds BMI:     26.87 Temp:     97.8 degrees F oral Pulse rate:   56 / minute Resp:     17 per minute BP sitting:   134 / 80  (left arm) Cuff size:   regular  Vitals Entered By: Shonna Chock CMA (January 15, 2010 9:41 AM) CC: Chills and diarrhea since about 4am Sunday morning, Diarrhea   Primary Care Provider:  Nolon Rod. Paz MD  CC:  Chills and diarrhea since about 4am Sunday morning and Diarrhea.  History of Present Illness: Diarrhea      This is a 70 year old man who presents with Diarrhea since 11/13 @ 4 am.  The patient reports >6 stools per day, semiformed/loose stools initially , then watery/unformed stools, voluminous stools, fecal urgency, nocturnal diarrhea, fasting diarrhea, and abrupt onset of symptoms, but denies blood in stool, mucus in stool, greasy stools, malodorous stools, fecal soiling, and bloating.  Associated symptoms include fever.  The patient  he has abdominal "pressure" but he denies abdominal pain, abdominal cramps, nausea, vomiting, lightheadedness, increased thirst, joint pains, mouth ulcers, and eye redness.  The symptoms are better with fasting.  Patient has GERD   history& intermittent diarrhea 2-3X/month over past year. Marland Kitchen Upper Endo with dilation  2 months ago : 3 esophageal ulcers.   Current Medications (verified): 1)  Maxzide-25 37.5-25 Mg  Tabs (Triamterene-Hctz) .Marland Kitchen.. 1 By Mouth Once Daily 2)  Enalapril Maleate 5 Mg  Tabs (Enalapril Maleate) .... One Two Times A Day 3)  Metoprolol Tartrate 100 Mg Tabs (Metoprolol Tartrate) .Marland Kitchen.. 1 Tablet By Mouth Twice A Day 4)  Niaspan 750 Mg  Tbcr (Niacin (Antihyperlipidemic)) .... Take 2 Tablets At Bedtime. Needs Office Visit. 5)  Simvastatin 40 Mg Tabs (Simvastatin) .... 2po Qhs 6)  Aspirin  Tabs (Aspirin Tabs) .... 2 Daily 7)  Coq10  Caps (Coenzyme Q10 Caps) 8)  Fish Oil  Caps (Omega-3 Fatty Acids Caps) .Marland Kitchen.. 1 By  Mouth Once Daily 9)  Alprazolam 0.5 Mg Tabs (Alprazolam) .Marland Kitchen.. 1-2 Tablets Daily Single Dose 10)  Flax Seed Oil 1000 Mg Caps (Flaxseed (Linseed)) .Marland Kitchen.. 1 By Mouth Two Times A Day 11)  Zantac 150 Mg Tabs (Ranitidine Hcl) .Marland Kitchen.. 1 By Mouth Once Daily  Allergies: 1)  Enalapril Maleate (Enalapril Maleate) 2)  Micardis (Telmisartan)  Past History:  Past Surgical History: Coronary artery bypass graft 2004(LIMA to the LADs,  sequential SVG to intermediate and obtuse marginal, sequential SVG  to PDA and posterolateral). Inguinal herniorrhaphy; Colonoscopy : negative     Physical Exam  General:  in no acute distress; alert,appropriate and cooperative throughout examination Eyes:  No corneal or conjunctival inflammation noted.No lid lag.No icterus. Mouth:  Oral mucosa and oropharynx without lesions or exudates.  Tongue moist Lungs:  Normal respiratory effort, chest expands symmetrically. Lungs are clear to auscultation, no crackles or wheezes. Heart:  regular rhythm, no gallop, no rub, no JVD, no HJR, and bradycardia.   Abdomen:  Bowel sounds positive,abdomen soft and non-tender without masses, organomegaly or hernias noted. Neurologic:  alert & oriented X3 and DTRs symmetrical and normal.  Fine hand tremor Skin:  Slight tenting; no jaundice. Cervical Nodes:  No lymphadenopathy noted Axillary Nodes:  No palpable lymphadenopathy Psych:  memory intact for recent and remote, normally interactive, and good eye contact.     Impression & Recommendations:  Problem # 1:  DIARRHEA (ICD-787.91)  recurrent over past year; this is worst episode to date  Orders: Venipuncture ([address]40) TLB-BMP (Basic Metabolic Panel-BMET) (80048-METABOL) TLB-CBC Platelet - w/Differential (85025-CBCD) TLB-Hepatic/Liver Function Pnl (80076-HEPATIC) TLB-TSH (Thyroid Stimulating Hormone) (84443-TSH) T-Culture, Stool (87045/87046-70140) T-Culture, C-Diff Toxin A/B (98119-14782) T-Stool Fats [place] Stain  [phone]) T-Stool Giardia / Crypto- EIA (78469) T-Stool for O&P (62952-84132) Prescription Created Electronically [phone])  His updated medication list for this problem includes:    Lonox 2.5-0.025 Mg Tabs (Diphenoxylate-atropine) .Marland Kitchen... 1 as needed frank diarrhea  Complete Medication List: 1)  Maxzide-25 37.5-25 Mg Tabs (Triamterene-hctz) .Marland Kitchen.. 1 by mouth once daily 2)  Enalapril Maleate 5 Mg Tabs (Enalapril maleate) .... One two times a day 3)  Metoprolol Tartrate 100 Mg Tabs (Metoprolol tartrate) .Marland Kitchen.. 1 tablet by mouth twice a day 4)  Niaspan 750 Mg Tbcr (Niacin (antihyperlipidemic)) .... Take 2 tablets at bedtime. needs office visit. 5)  Simvastatin 40 Mg Tabs (Simvastatin) .... 2po qhs 6)  Aspirin Tabs (Aspirin tabs) .... 2 daily 7)  Coq10 Caps (Coenzyme q10 caps) 8)  Fish Oil Caps (Omega-3 fatty acids caps) .Marland Kitchen.. 1 by mouth once daily 9)  Alprazolam 0.5 Mg Tabs (Alprazolam) .Marland Kitchen.. 1-2 tablets daily single dose 10)  Flax Seed Oil 1000 Mg Caps (Flaxseed (linseed)) .Marland Kitchen.. 1 by mouth two times a day 11)  Zantac 150 Mg Tabs (Ranitidine hcl) .Marland Kitchen.. 1 by mouth once daily 12)  Lonox 2.5-0.025 Mg Tabs (Diphenoxylate-atropine) .Marland Kitchen.. 1 as needed frank diarrhea  Patient Instructions: 1)  Drink clear liquids only for the next 24 hours, then slowly add other liquids and food as you  tolerate them. Prescriptions: LONOX 2.5-0.025 MG TABS (DIPHENOXYLATE-ATROPINE) 1 as needed frank diarrhea  #10 x 0   Entered and Authorized by:   Marga Melnick MD   Signed by:   Marga Melnick MD on 01/15/2010   Method used:   Printed then faxed to ...       [facility] (retail)       [address]       [place], [place]  [address]       Ph: [phone]       Fax: [phone]   RxID:   [id number]    Orders Added: 1)  Est. Patient Level IV [16010] 2)  Venipuncture [93235] 3)  TLB-BMP (Basic Metabolic Panel-BMET) [80048-METABOL] 4)  TLB-CBC Platelet - w/Differential  [85025-CBCD] 5)  TLB-Hepatic/Liver Function Pnl [80076-HEPATIC] 6)  TLB-TSH (Thyroid Stimulating Hormone) [84443-TSH] 7)  T-Culture, Stool [87045/87046-70140] 8)  T-Culture, C-Diff Toxin A/B [57322-02542] 9)  T-Stool Fats [place] Stain [70623-76283] 10)  T-Stool Giardia / Crypto- EIA [15176] 11)  T-Stool for O&P [87177-70555] 12)  Prescription Created Electronically [H6073]  Appended Document: chills-dirrehea/cbs

## 2010-04-05 NOTE — Progress Notes (Signed)
Summary: ALPRAZOLAM REFILL  Phone Note Refill Request Message from:  Fax from Pharmacy on November 07, 2009 2:58 PM  Refills Requested: Medication #1:  ALPRAZOLAM 0.5 MG TABS 1-2 tablets daily single dose. Royden Purl = [phone]  Initial call taken by: Jerolyn Shin,  November 07, 2009 2:59 PM  Follow-up for Phone Call        last filled 07/12/09 #60 x 2. Army Fossa CMA  November 07, 2009 3:04 PM   Additional Follow-up for Phone Call Additional follow up Details #1::        ok #60 and 3 refills Additional Follow-up by: [facility] E. Paz MD,  November 07, 2009 4:06 PM    Prescriptions: ALPRAZOLAM 0.5 MG TABS (ALPRAZOLAM) 1-2 tablets daily single dose  #60 x 2   Entered by:   Army Fossa CMA   Authorized by:   Nolon Rod. Paz MD   Signed by:   Army Fossa CMA on 11/07/2009   Method used:   Printed then faxed to ...       [facility] (retail)       [address]       [place], [place]  [address]       Ph: [phone]       Fax: [phone]   RxID:   [id number]

## 2010-04-05 NOTE — Consult Note (Signed)
Summary: RX CT sinus ----ENT  [facility]   Imported By: Lanelle Bal 08/17/2009 08:55:54  _____________________________________________________________________  External Attachment:    Type:   Image     Comment:   External Document

## 2010-04-05 NOTE — Progress Notes (Signed)
Summary: REFILL, xanax, has appt 5/24  Phone Note Refill Request Call back at [phone] Message from:  Fax from Pharmacy on Jul 12, 2009 10:03 AM  Refills Requested: Medication #1:  ALPRAZOLAM 0.5 MG TABS 1-2 tablets daily single dose   Last Refilled: 06/12/2009 [facility] FAX# [phone]   Method Requested: Fax to Local Pharmacy Next Appointment Scheduled: Jul 25, 2009 Initial call taken by: Barnie Mort,  Jul 12, 2009 10:03 AM  Follow-up for Phone Call        #60 with 3 refills on 03/17/09 [facility]  Jul 12, 2009 10:21 AM ok 60 and 3 RF Kahleel Fadeley E. Kamy Poinsett MD  Jul 12, 2009 3:50 PM     Prescriptions: ALPRAZOLAM 0.5 MG TABS (ALPRAZOLAM) 1-2 tablets daily single dose  #60 x 3   Entered by:   [name]   Authorized by:   Nolon Rod. Kateri Balch MD   Signed by:   [name] on 07/12/2009   Method used:   Printed then faxed to ...       [facility] (retail)       [address]       [place], [place]  [address]       Ph: [phone]       Fax: [phone]   RxID:   [id number]

## 2010-04-05 NOTE — Letter (Signed)
Summary: EGD Instructions  [facility]  [address] [place], [place] [address]   Phone: [phone]  Fax: [phone]       [name] Little    03/19/1940    MRN: [id number]       Procedure Day /Date:09/15/09  FRI     Arrival Time: 730 am     Procedure Time:830 am     Location of Procedure:                    [facility] (4th Floor)    PREPARATION FOR ENDOSCOPY   On7/15/11 THE DAY OF THE PROCEDURE:  1.   No solid foods, milk or milk products are allowed after midnight the night before your procedure.  2.   Do not drink anything colored red or purple.  Avoid juices with pulp.  No orange juice.  3.  You may drink clear liquids until 630 am, which is 2 hours before your procedure.                                                                                                CLEAR LIQUIDS INCLUDE: Water Jello Ice Popsicles Tea (sugar ok, no milk/cream) Powdered fruit flavored drinks Coffee (sugar ok, no milk/cream) Gatorade Juice: apple, white grape, white cranberry  Lemonade Clear bullion, consomm, broth Carbonated beverages (any kind) Strained chicken noodle soup Hard Candy   MEDICATION INSTRUCTIONS  Unless otherwise instructed, you should take regular prescription medications with a small sip of water as early as possible the morning of your procedure.             OTHER INSTRUCTIONS  You will need a responsible adult at least 71 years of age to accompany you and drive you home.   This person must remain in the waiting room during your procedure.  Wear loose fitting clothing that is easily removed.  Leave jewelry and other valuables at home.  However, you may wish to bring a book to read or an iPod/MP3 player to listen to music as you wait for your procedure to start.  Remove all body piercing jewelry and leave at home.  Total time from sign-in until discharge is approximately 2-3 hours.  You should go home directly  after your procedure and rest.  You can resume normal activities the day after your procedure.  The day of your procedure you should not:   Drive   Make legal decisions   Operate machinery   Drink alcohol   Return to work  You will receive specific instructions about eating, activities and medications before you leave.    The above instructions have been reviewed and explained to me by   _______________________    I fully understand and can verbalize these instructions _____________________________ Date _________

## 2010-04-05 NOTE — Progress Notes (Signed)
Summary: RADIOLOGY REFERRAL/NECK U/S  Phone Note Outgoing Call Call back at [facility] Phone [phone]   Call placed by: Magdalen Spatz [facility],  Jul 26, 2009 1:39 PM Call placed to: Patient Summary of Call: IN REFERENCE TO RADIOLOGY REFERRAL, I SCHEDULED PATIENT APPT FOR 07-28-2009 FOR NECK U/S.  I CALLED TO INFORM PT OF APPT, AND HE STATED HE HAS ALREADY HAD AN U/S FOR THIS PROBLEM BEFORE, AND WAS UNDER THE IMPRESSION THAT DR. Gordy Goar WAS GOING TO REFER HIM TO ENT ONLY, NOT FOR AN U/S.  PLEASE ADVISE.    Initial call taken by: Magdalen Spatz [facility],  Jul 26, 2009 1:40 PM  Follow-up for Phone Call        the patient is correct, I was thinking about an ENT referral but let's do the ultrasound first. If the patient not agreeable, then refer to ENT Follow-up by: [facility] E. Cathern Tahir MD,  Jul 27, 2009 8:24 AM  Additional Follow-up for Phone Call Additional follow up Details #1::        Audie Pinto has paperwork she will call patient .Kandice Hams  Jul 27, 2009 11:10 AM  I have left messages on patient's home & cell phone to return my call.  Magdalen Spatz [facility]  Aug 01, 2009 8:44 AM  I have left messaged on home & cell to return my call Magdalen Spatz [facility]  August 03, 2009 11:05 AM       Additional Follow-up for Phone Call Additional follow up Details #2::    I spoke with patient who states he already has an appt with [facility] on 08-08-2009 at 10am w/Dr Pollyann Kennedy, and patient is declining to have the ultrasound at this time.  I will fax notes to [facility]NT. Follow-up by: Magdalen Spatz [facility],  August 03, 2009 11:13 AM  Additional Follow-up for Phone Call Additional follow up Details #3:: Details for Additional Follow-up Action Taken: noted Maryruth Apple E. Rodolph Hagemann MD  August 04, 2009 8:14 AM

## 2010-04-05 NOTE — Progress Notes (Signed)
Summary: results, lmom 5/25, 5/27, 5/31 - copy mailed  Phone Note Outgoing Call Call back at Home Phone [phone] Call back at Work Phone [phone]   Reason for Call: Discuss lab or test results Details for Reason: advised patient: Diabetes under excellent control, good results. We talked about at ENT referral in reference to a soft tissue mass in the neck, will start with a ultrasound and if there is any questions, will refer to ENT then   Signed by [facility] E. Paz MD on 07/25/2009 at 6:29 PM  Summary of Call: left message on machine for pt to return call.......Marland KitchenShary Decamp  Jul 26, 2009 11:52 AM left message on machine for pt to return call.Marland KitchenMarland KitchenShary Decamp  Jul 28, 2009 9:51 AM left message on machine for pt to return call, copy of labs mailed to pt......Marland KitchenShary Decamp  Aug 01, 2009 9:08 AM

## 2010-04-05 NOTE — Progress Notes (Signed)
Summary: refill  Phone Note Refill Request Message from:  [facility] [address] fax [phone]  Refills Requested: Medication #1:  ALPRAZOLAM 0.5 MG TABS 1-2 tablets daily single dose Initial call taken by: Barb Merino,  March 17, 2009 2:21 PM  Follow-up for Phone Call        #60 with 3 refills on 11/17/08; last ov 12/28/2008; ok'd #60 with 3 Follow-up by: [name],  March 17, 2009 2:30 PM    Prescriptions: ALPRAZOLAM 0.5 MG TABS (ALPRAZOLAM) 1-2 tablets daily single dose  #60 x 3   Entered by:   [name]   Authorized by:   Nolon Rod. Paz MD   Signed by:   [name] on 03/17/2009   Method used:   Printed then faxed to ...       [facility] (retail)       [address]       [place], [place]  [address]       Ph: [phone]       Fax: [phone]   RxID:   [id number]

## 2010-04-05 NOTE — Assessment & Plan Note (Signed)
Review of gastrointestinal problems: 1. routine risk for colon cancer; Colonoscopy October, 2008 found hyperplastic polyps but was otherwise normal. Recall colonoscopy at 10 year interval    History of Present Illness Visit Type: Follow-up Visit Primary GI MD: Rob Bunting MD Primary Provider: Nolon Rod. Paz MD Requesting Provider: Amada Jupiter, MD Chief Complaint: GERD History of Present Illness:     very pleasant 70 year old man whom I last saw 2 years ago at the time of a colonoscopy. See those results summarized above.  he has had many years of after eating a meal, he has a feeling of food particles getting cought in throat and this will cause hiccups. The hiccups can last up to an hour. sometimes he will vomit.  He can somtimes pick the food bolus out after hiccuping or vomiting.  He believes this occurs in throat, above his esophagus.  Has no classic esophageal dysphagia.        , this has increased so that it is now "terrible."  Has this symptoms daily at least, sometimes 2-3 times a day.  He has had ENT evaluation, was given nasonex, saline for some polyps in the nose that he felt maybe caused his symptoms.  Asked about a "displacemnt" that will clean out his sinus'.  He does feel a lot of sinus drainage down the back of his throat.  He has no pyrosis.  No classic dysphagia.   He had a swallowing study "several years ago," not with speech therapy.           Current Medications (verified): 1)  Maxzide-25 37.5-25 Mg  Tabs (Triamterene-Hctz) .... Half P.o. Every Morning For One Week, Then Take One Every Morning 2)  Enalapril Maleate 5 Mg  Tabs (Enalapril Maleate) .... One Two Times A Day 3)  Metoprolol Tartrate 100 Mg Tabs (Metoprolol Tartrate) .Marland Kitchen.. 1 Tablet By Mouth Twice A Day 4)  Niaspan 750 Mg  Tbcr (Niacin (Antihyperlipidemic)) .... Take 2 Tablets At Bedtime 5)  Simvastatin 40 Mg Tabs (Simvastatin) .... 2po Qhs 6)  Aspirin  Tabs (Aspirin Tabs) .... 2 Daily 7)  Coq10   Caps (Coenzyme Q10 Caps) 8)  Fish Oil  Caps (Omega-3 Fatty Acids Caps) .... 1200mg  3 Qd 9)  Multivitamins  Tabs (Multiple Vitamin) .... 2 Daily 10)  Alprazolam 0.5 Mg Tabs (Alprazolam) .Marland Kitchen.. 1-2 Tablets Daily Single Dose  Allergies (verified): 1)  Enalapril Maleate (Enalapril Maleate) 2)  Micardis (Telmisartan)  Past History:  Past Medical History: Anxiety-depression Hyperlipidemia Hypertension Borderline DM GERD Headache - s/p neuro eval ? migraine ( 06/2003) CAD   hyperplastic colon polyps 2008 IRON DEFICIENCY   ARTHRITIS   Ischemic cardiomyopathy (EF had been 30-35%.  The most recent EF  was 50% on echo in September 2009).  Past Surgical History: Coronary artery bypass graft 2004(LIMA to the LADs,  sequential SVG to intermediate and obtuse marginal, equential SVG  to PDA and posterolateral). Inguinal herniorrhaphy    Family History: DM--father colon ca--no prostate ca--no MI--M and F   Social History: Married lives w/ wife no children tobacco-- quit in the 43s ETOH-- no diet--does watch exercise-- [facility]   Review of Systems       Pertinent positive and negative review of systems were noted in the above HPI and GI specific review of systems.  All other review of systems was otherwise negative.   Vital Signs:  Patient profile:   70 year old male Height:      70 inches Weight:  186.13 pounds BMI:     26.80 Pulse rate:   72 / minute Pulse rhythm:   regular BP sitting:   130 / 72  (left arm) Cuff size:   regular  Vitals Entered By: June McMurray CMA Duncan Dull) (September 12, 2009 10:00 AM)  Physical Exam  Additional Exam:  Constitutional: generally well appearing Psychiatric: alert and oriented times 3 Eyes: extraocular movements intact Mouth: oropharynx moist, no lesions   Neck: supple, no lymphadenopathy Cardiovascular: heart regular rate and rythm Lungs: CTA bilaterally Abdomen: soft, non-tender, non-distended, no obvious ascites, no peritoneal  signs, normal bowel sounds Extremities: no lower extremity edema bilaterally Skin: no lesions on visible extremities    Impression & Recommendations:  Problem # 1:  oral pharyngeal dysphasia he is very clear that he has a feeling of food particles getting stuck in the back of his throat. This causes him to hicup and sometimes vomit.  he has no pyrosis, acid taste, regurg. He has tried proton pump inhibitors in the past without any change in his symptoms. It is not clear to me that he has reflux at all. I think EGD is a reasonable next step however to check for anatomic issues, perhaps a Zenker's diverticulum, perhaps he does have an esophageal ring or web. If the EGD is unrevealing then I would likely set him up with speech pathology, modified barium swallow studies.  Patient Instructions: 1)  You will be scheduled to have an upper endoscopy. 2)  A copy of this information will be sent to Dr. Pollyann Kennedy, Drue Novel. 3)  The medication list was reviewed and reconciled.  All changed / newly prescribed medications were explained.  A complete medication list was provided to the patient / caregiver.  Appended Document: Orders Update/EGD    Clinical Lists Changes  Orders: Added new Test order of EGD (EGD) - Signed

## 2010-04-05 NOTE — Assessment & Plan Note (Signed)
Summary: flu shot/cbs   Nurse Visit  CC: Flu shot./kb   Allergies: 1)  Enalapril Maleate (Enalapril Maleate) 2)  Micardis (Telmisartan)  Orders Added: 1)  Flu Vaccine 38yrs + MEDICARE PATIENTS [Q2039] 2)  Administration Flu vaccine - MCR [G0008]           Flu Vaccine Consent Questions     Do you have a history of severe allergic reactions to this vaccine? no    Any prior history of allergic reactions to egg and/or gelatin? no    Do you have a sensitivity to the preservative Thimersol? no    Do you have a past history of Guillan-Barre Syndrome? no    Do you currently have an acute febrile illness? no    Have you ever had a severe reaction to latex? no    Vaccine information given and explained to patient? yes    Are you currently pregnant? no    Lot Number:[id number]   Exp Date:09/01/2010   Site Given  Left Deltoid IMu

## 2010-04-19 NOTE — Progress Notes (Signed)
Summary: 59, no RF. No further RF w/o OV/ appts-appt 022012  Phone Note Refill Request Message from:  Fax from Pharmacy on April 05, 2010 1:06 PM  Refills Requested: Medication #1:  ALPRAZOLAM 0.5 MG TABS 1-2 tablets daily single dose [facility] [address] - fax [phone]  Initial call taken by: Okey Regal Spring,  April 05, 2010 1:06 PM  Follow-up for Phone Call        59, no RF. No further RF w/o OV Jose E. Paz MD  April 06, 2010 10:18 AM   Additional Follow-up for Phone Call Additional follow up Details #1::        Please call pt and schedule OV.  Additional Follow-up by: Army Fossa CMA,  April 06, 2010 10:23 AM    Additional Follow-up for Phone Call Additional follow up Details #2::    LMOM to call and schedule a regular office in order to get more refills.Jerolyn Shin  April 09, 2010 4:21 PM  patient returned call - appt scheduled 022012   .Marland KitchenOkey Regal Spring  April 09, 2010 4:38 PM   Prescriptions: ALPRAZOLAM 0.5 MG TABS (ALPRAZOLAM) 1-2 tablets daily single dose  #60 x 0   Entered by:   Army Fossa CMA   Authorized by:   Nolon Rod. Paz MD   Signed by:   Army Fossa CMA on 04/06/2010   Method used:   Printed then faxed to ...       [facility] (retail)       [address]       [place], [place]  [address]       Ph: [phone]       Fax: [phone]   RxID:   [id number]

## 2010-04-23 ENCOUNTER — Ambulatory Visit (INDEPENDENT_AMBULATORY_CARE_PROVIDER_SITE_OTHER): Payer: Medicare Other | Admitting: Internal Medicine

## 2010-04-23 ENCOUNTER — Encounter: Payer: Self-pay | Admitting: Internal Medicine

## 2010-04-23 DIAGNOSIS — J309 Allergic rhinitis, unspecified: Secondary | ICD-10-CM | POA: Insufficient documentation

## 2010-04-23 DIAGNOSIS — E119 Type 2 diabetes mellitus without complications: Secondary | ICD-10-CM

## 2010-04-23 DIAGNOSIS — E785 Hyperlipidemia, unspecified: Secondary | ICD-10-CM

## 2010-04-23 DIAGNOSIS — I10 Essential (primary) hypertension: Secondary | ICD-10-CM

## 2010-04-24 ENCOUNTER — Encounter (INDEPENDENT_AMBULATORY_CARE_PROVIDER_SITE_OTHER): Payer: Self-pay | Admitting: *Deleted

## 2010-04-24 ENCOUNTER — Other Ambulatory Visit: Payer: Self-pay | Admitting: Internal Medicine

## 2010-04-24 ENCOUNTER — Other Ambulatory Visit (INDEPENDENT_AMBULATORY_CARE_PROVIDER_SITE_OTHER): Payer: Medicare Other

## 2010-04-24 DIAGNOSIS — E119 Type 2 diabetes mellitus without complications: Secondary | ICD-10-CM

## 2010-04-24 DIAGNOSIS — I10 Essential (primary) hypertension: Secondary | ICD-10-CM

## 2010-04-24 DIAGNOSIS — E785 Hyperlipidemia, unspecified: Secondary | ICD-10-CM

## 2010-04-24 LAB — HEMOGLOBIN A1C: Hgb A1c MFr Bld: 6 % (ref 4.6–6.5)

## 2010-04-25 LAB — LIPID PANEL
Cholesterol: 110 mg/dL (ref 0–200)
HDL: 36.4 mg/dL — ABNORMAL LOW (ref >39.00)
LDL Cholesterol: 61 mg/dL (ref 0–99)
Total CHOL/HDL Ratio: 3
Triglycerides: 62 mg/dL (ref 0.0–149.0)
VLDL: 12.4 mg/dL (ref 0.0–40.0)

## 2010-04-25 LAB — BASIC METABOLIC PANEL
BUN: 32 mg/dL — ABNORMAL HIGH (ref 6–23)
Calcium: 9.4 mg/dL (ref 8.4–10.5)
Chloride: 104 mEq/L (ref 96–112)
Creatinine, Ser: 1.8 mg/dL — ABNORMAL HIGH (ref 0.4–1.5)
GFR: 40.57 mL/min — ABNORMAL LOW (ref >60.00)

## 2010-04-26 ENCOUNTER — Telehealth: Payer: Self-pay | Admitting: Cardiology

## 2010-04-26 DIAGNOSIS — N189 Chronic kidney disease, unspecified: Secondary | ICD-10-CM | POA: Insufficient documentation

## 2010-04-27 ENCOUNTER — Other Ambulatory Visit: Payer: Self-pay | Admitting: Internal Medicine

## 2010-04-27 ENCOUNTER — Telehealth (INDEPENDENT_AMBULATORY_CARE_PROVIDER_SITE_OTHER): Payer: Self-pay | Admitting: *Deleted

## 2010-04-27 DIAGNOSIS — N189 Chronic kidney disease, unspecified: Secondary | ICD-10-CM

## 2010-05-01 ENCOUNTER — Ambulatory Visit
Admission: RE | Admit: 2010-05-01 | Discharge: 2010-05-01 | Disposition: A | Discharge status: Home / Self Care | Payer: Medicare Other | Source: Ambulatory Visit | Attending: Internal Medicine | Admitting: Internal Medicine

## 2010-05-01 DIAGNOSIS — N189 Chronic kidney disease, unspecified: Secondary | ICD-10-CM

## 2010-05-01 NOTE — Progress Notes (Signed)
Summary: pt has question re meds   Phone Note Call from Patient Call back at Home Phone [phone]   Caller: Patient Reason for Call: Talk to Nurse Summary of Call: pt has question re meds that dr. Drue Novel want him to stay off.  Initial call taken by: Roe Coombs,  April 26, 2010 9:13 AM  Follow-up for Phone Call        Dr Drue Novel is wanting pt to remain off of Maxzide d/t renal insuffiency.  This appropriate.  Dr Drue Novel is looking into causes of pts renal insuffiency.  Pt will need to monitor BP and let Dr Drue Novel know if it starts trending up.  Pt states understanding but is concerned that he may have a "heart attack" if he stops medication.  Instructed pt that Maxzide is for the treatment of HTN and while it it important to treat his hypertension we also must protect his kidneys as well.  Again I instructed pt to take BP once daily and contact Dr Drue Novel if it starts trending up.  He is in agreement and states understanding Follow-up by: Charolotte Capuchin, RN,  April 26, 2010 11:57 AM

## 2010-05-01 NOTE — Progress Notes (Addendum)
Summary: U/S Concerns   Phone Note From Other Clinic Call back at [phone]   Caller: Neysa Bonito from [facility] Call For: Dr. Drue Novel Details for Reason: Patient refusing ultrasound Summary of Call: Neysa Bonito called patient to verify insurance information and the patient says that he is not going to do the ultrasound until he has his followup blood pressure check appointment. He is currently scheduled for 05-01-10.  Call [facility] if you want to cancel this appointment.  Follow-up for Phone Call        Message left on triage voicemail: Please call ASAP to discuss U/S   I called and spoke with patient and re-discussed his lab results with him. Patient informed that the U/S was not a before or after action to be done. The U/S was to be done in addition to the B/P check. Patient stated he thought the U/S was after the B/p check and thats why he refused but now that this is more clear to him he will proceed with Dr.Paz's recommendations.  I called Neysa Bonito and left her a message informing her patient has agreed to keep appointment Follow-up by: Shonna Chock CMA,  April 27, 2010 1:39 PM

## 2010-05-01 NOTE — Assessment & Plan Note (Signed)
Summary: med check/cbs   Vital Signs:  Patient profile:   70 year old male Height:      70 inches Weight:      194.25 pounds BMI:     27.97 Temp:     98.1 degrees F oral Pulse rate:   79 / minute Pulse rhythm:   regular BP sitting:   126 / 86  (left arm) Cuff size:   regular  Vitals Entered By: Army Fossa CMA (April 23, 2010 10:27 AM) CC: Pt here for [place] Office visit- Yearly? not fasting  Comments f/u on meds [facility]    History of Present Illness: ROV feeling OK  ROS saw ENT last year d/t nasal congestion and a neck knot, dx w/ nasal polyps , Rx saline solution Still has congestion  no N-V-blood in stools, occasionally diarrhea saw cards 10-11 , note reviewed , stable saw GI 7-11, EGD showed few small ulcers, esophagus stretched ; he had hicups ----> they are better     Preventive Screening-Counseling & Management  Caffeine-Diet-Exercise     Does Patient Exercise: yes     Times/week: 2  Current Medications (verified): 1)  Maxzide-25 37.5-25 Mg  Tabs (Triamterene-Hctz) .Marland Kitchen.. 1 By Mouth Once Daily. Due For Office Visit. 2)  Enalapril Maleate 5 Mg  Tabs (Enalapril Maleate) .... One Two Times A Day 3)  Metoprolol Tartrate 100 Mg Tabs (Metoprolol Tartrate) .Marland Kitchen.. 1 Tablet By Mouth Twice A Day. Due For Office Visit. 4)  Niaspan 750 Mg  Tbcr (Niacin (Antihyperlipidemic)) .... Take 2 Tablets At Bedtime. Needs Office Visit. 5)  Simvastatin 40 Mg Tabs (Simvastatin) .... 2po Qhs 6)  Aspirin  Tabs (Aspirin Tabs) .... 2 Daily 7)  Coq10  Caps (Coenzyme Q10 Caps) 8)  Fish Oil  Caps (Omega-3 Fatty Acids Caps) .Marland Kitchen.. 1 By Mouth Once Daily 9)  Alprazolam 0.5 Mg Tabs (Alprazolam) .Marland Kitchen.. 1-2 Tablets Daily Single Dose 10)  Flax Seed Oil 1000 Mg Caps (Flaxseed (Linseed)) .Marland Kitchen.. 1 By Mouth Two Times A Day 11)  Zantac 150 Mg Tabs (Ranitidine Hcl) .Marland Kitchen.. 1 By Mouth Once Daily  Allergies (verified): 1)  Enalapril Maleate (Enalapril Maleate) 2)  Micardis (Telmisartan)  Past  History:  Past Medical History: Anxiety-depression Hyperlipidemia Hypertension Borderline DM GERD PUD------7-11, EGD showed few small ulcers, esophagus stretched Headache - s/p neuro eval ? migraine ( 06/2003) CAD   hyperplastic colon polyps 2008 IRON DEFICIENCY   ARTHRITIS   Ischemic cardiomyopathy (EF had been 30-35%.  The most recent EF  was 50% on echo in September 2009). Allergic rhinitis  Past Surgical History: Reviewed history from 01/15/2010 and no changes required. Coronary artery bypass graft 2004(LIMA to the LADs,  sequential SVG to intermediate and obtuse marginal, sequential SVG  to PDA and posterolateral). Inguinal herniorrhaphy; Colonoscopy : negative     Social History: Reviewed history from 09/12/2009 and no changes required. Married lives w/ wife no children tobacco-- quit in the 41s ETOH-- no diet--does watch [facility]  Does Patient Exercise:  yes  Physical Exam  General:  alert and well-developed.   Nose:  slightly  congested  Lungs:  Normal respiratory effort, chest expands symmetrically. Lungs are clear to auscultation, no crackles or wheezes. Heart:  regular rhythm, no gallop, no rub, no JVD, no HJR, and bradycardia.     Impression & Recommendations:  Problem # 1:  ALLERGIC RHINITIS (ICD-477.9)  c/o RN, recently dx w/ nasal polyps Rx astepro at night   His updated medication list  for this problem includes:    Astepro 0.15 % Soln (Azelastine hcl) .Marland Kitchen... 2 sprays at nigh on each side  Problem # 2:  NECK MASS (ICD-784.2) saw ENT last year  Problem # 3:  HYPERTENSION (ICD-401.9) at goal, check creatinine  His updated medication list for this problem includes:    Maxzide-25 37.5-25 Mg Tabs (Triamterene-hctz) .Marland Kitchen... 1 by mouth once daily.    Enalapril Maleate 5 Mg Tabs (Enalapril maleate) ..... One two times a day    Metoprolol Tartrate 100 Mg Tabs (Metoprolol tartrate) .Marland Kitchen... 1 tablet by mouth twice a day.  BP today:  126/86 Prior BP: 134/80 (01/15/2010)  Labs Reviewed: K+: 4.7 (01/15/2010) Creat: : 1.6 (01/15/2010)   Chol: 107 (03/31/2009)   HDL: 42.00 (03/31/2009)   LDL: 47 (03/31/2009)   TG: 88.0 (03/31/2009)  Problem # 4:  CAD (ICD-414.00) asx His updated medication list for this problem includes:    Maxzide-25 37.5-25 Mg Tabs (Triamterene-hctz) .Marland Kitchen... 1 by mouth once daily.    Enalapril Maleate 5 Mg Tabs (Enalapril maleate) ..... One two times a day    Metoprolol Tartrate 100 Mg Tabs (Metoprolol tartrate) .Marland Kitchen... 1 tablet by mouth twice a day.    Aspirin Tabs (Aspirin tabs) .Marland Kitchen... 2 daily  Problem # 5:  HYPERLIPIDEMIA (ICD-272.4) labs  His updated medication list for this problem includes:    Niaspan 750 Mg Tbcr (Niacin (antihyperlipidemic)) .Marland Kitchen... Take 2 tablets at bedtime.    Simvastatin 40 Mg Tabs (Simvastatin) .Marland Kitchen... 2po qhs  Labs Reviewed: SGOT: 25 (01/15/2010)   SGPT: 22 (01/15/2010)   HDL:42.00 (03/31/2009), 30.4 (02/04/2008)  LDL:47 (03/31/2009), 58 (02/04/2008)  Chol:107 (03/31/2009), 104 (02/04/2008)  Trig:88.0 (03/31/2009), 80 (02/04/2008)  Problem # 6:  DIABETES MELLITUS, MILD (ICD-250.00) labs  His updated medication list for this problem includes:    Enalapril Maleate 5 Mg Tabs (Enalapril maleate) ..... One two times a day    Aspirin Tabs (Aspirin tabs) .Marland Kitchen... 2 daily  Labs Reviewed: Creat: 1.6 (01/15/2010)    Reviewed HgBA1c results: 5.8 (07/25/2009)  5.9 (03/31/2009)  Complete Medication List: 1)  Maxzide-25 37.5-25 Mg Tabs (Triamterene-hctz) .Marland Kitchen.. 1 by mouth once daily. 2)  Enalapril Maleate 5 Mg Tabs (Enalapril maleate) .... One two times a day 3)  Metoprolol Tartrate 100 Mg Tabs (Metoprolol tartrate) .Marland Kitchen.. 1 tablet by mouth twice a day. 4)  Niaspan 750 Mg Tbcr (Niacin (antihyperlipidemic)) .... Take 2 tablets at bedtime. 5)  Simvastatin 40 Mg Tabs (Simvastatin) .... 2po qhs 6)  Aspirin Tabs (Aspirin tabs) .... 2 daily 7)  Coq10 Caps (Coenzyme q10 caps) 8)  Fish Oil Caps  (Omega-3 fatty acids caps) .Marland Kitchen.. 1 by mouth once daily 9)  Alprazolam 0.5 Mg Tabs (Alprazolam) .Marland Kitchen.. 1-2 tablets daily single dose 10)  Flax Seed Oil 1000 Mg Caps (Flaxseed (linseed)) .Marland Kitchen.. 1 by mouth two times a day 11)  Zantac 150 Mg Tabs (Ranitidine hcl) .Marland Kitchen.. 1 by mouth once daily 12)  Astepro 0.15 % Soln (Azelastine hcl) .... 2 sprays at nigh on each side  Patient Instructions: 1)  came back fasting: 2)  FLP ---dx high cholesterol 3)  A1C---dx diabetes 4)  BMP---dx HTN 5)  Please schedule a follow-up appointment in 3 to 4 months , fasting, physical exam   Prescriptions: ASTEPRO 0.15 % SOLN (AZELASTINE HCL) 2 sprays at nigh on each side  #1 x 6   Entered and Authorized by:   Elita Quick E. Johan Creveling MD   Signed by:   Nolon Rod. Braylyn Eye MD on 04/23/2010  Method used:   Print then Give to Patient   RxID:   [id number] METOPROLOL TARTRATE 100 MG TABS (METOPROLOL TARTRATE) 1 tablet by mouth twice a day.  #60 x 6   Entered by:   Army Fossa CMA   Authorized by:   Nolon Rod. Marita Burnsed MD   Signed by:   Army Fossa CMA on 04/23/2010   Method used:   Electronically to        [facility] (retail)       [address]       [place], [place]  [address]       Ph: [phone]       Fax: [phone]   RxID:   [id number]    Orders Added: 1)  Est. Patient Level III [74259]     Preventive Care Screening  Colonoscopy:    Date:  03/04/2004    Results:  polyps-per pt      Risk Factors:  Exercise:  yes    Times per week:  2  Colonoscopy History:     Date of Last Colonoscopy:  03/04/2004    Results:  polyps-per pt

## 2010-05-10 ENCOUNTER — Other Ambulatory Visit: Payer: Self-pay | Admitting: Internal Medicine

## 2010-05-10 ENCOUNTER — Encounter: Payer: Self-pay | Admitting: Internal Medicine

## 2010-05-10 ENCOUNTER — Encounter (INDEPENDENT_AMBULATORY_CARE_PROVIDER_SITE_OTHER): Payer: Medicare Other

## 2010-05-10 DIAGNOSIS — I10 Essential (primary) hypertension: Secondary | ICD-10-CM

## 2010-05-10 DIAGNOSIS — N189 Chronic kidney disease, unspecified: Secondary | ICD-10-CM

## 2010-05-10 LAB — BASIC METABOLIC PANEL
BUN: 19 mg/dL (ref 6–23)
CO2: 29 mEq/L (ref 19–32)
Glucose, Bld: 84 mg/dL (ref 70–99)
Potassium: 4 mEq/L (ref 3.5–5.1)
Sodium: 141 mEq/L (ref 135–145)

## 2010-05-14 ENCOUNTER — Telehealth (INDEPENDENT_AMBULATORY_CARE_PROVIDER_SITE_OTHER): Payer: Self-pay | Admitting: *Deleted

## 2010-05-15 NOTE — Assessment & Plan Note (Signed)
Summary: BP Check/BMP dx HTN/drb  Medications Added MAXZIDE-25 37.5-25 MG  TABS (TRIAMTERENE-HCTZ) held      Allergies Added:  Nurse Visit   Vital Signs:  Patient profile:   70 year old male Weight:      194 pounds BMI:     27.94 Pulse rate:   76 / minute BP sitting:   132 / 80  (left arm)  Vitals Entered By: Doristine Devoid CMA (May 10, 2010 9:04 AM) CC: BP check    Current Medications (verified): 1)  Maxzide-25 37.5-25 Mg  Tabs (Triamterene-Hctz) .... Held 2)  Enalapril Maleate 5 Mg  Tabs (Enalapril Maleate) .... One Two Times A Day 3)  Metoprolol Tartrate 100 Mg Tabs (Metoprolol Tartrate) .Marland Kitchen.. 1 Tablet By Mouth Twice A Day. 4)  Niaspan 750 Mg  Tbcr (Niacin (Antihyperlipidemic)) .... Take 2 Tablets At Bedtime. 5)  Simvastatin 40 Mg Tabs (Simvastatin) .... 2po Qhs 6)  Aspirin  Tabs (Aspirin Tabs) .... 2 Daily 7)  Coq10  Caps (Coenzyme Q10 Caps) 8)  Fish Oil  Caps (Omega-3 Fatty Acids Caps) .Marland Kitchen.. 1 By Mouth Once Daily 9)  Alprazolam 0.5 Mg Tabs (Alprazolam) .Marland Kitchen.. 1-2 Tablets Daily Single Dose 10)  Flax Seed Oil 1000 Mg Caps (Flaxseed (Linseed)) .Marland Kitchen.. 1 By Mouth Two Times A Day 11)  Zantac 150 Mg Tabs (Ranitidine Hcl) .Marland Kitchen.. 1 By Mouth Once Daily 12)  Astepro 0.15 % Soln (Azelastine Hcl) .... 2 Sprays At Nigh On Each Side  Allergies (verified): 1)  Enalapril Maleate (Enalapril Maleate) 2)  Micardis (Telmisartan)  Impression & Recommendations:  Problem # 1:  RENAL INSUFFICIENCY, CHRONIC (ICD-585.9)  rechecking  kidney function today , ultrasound of the kidney was normal  Problem # 2:  HYPERTENSION (ICD-401.9) patient was holding  Maxide, BP still within normal  The following medications were removed from the medication list:    Maxzide-25 37.5-25 Mg Tabs (Triamterene-hctz) ..... Held His updated medication list for this problem includes:    Enalapril Maleate 5 Mg Tabs (Enalapril maleate) ..... One two times a day    Metoprolol Tartrate 100 Mg Tabs (Metoprolol tartrate)  .Marland Kitchen... 1 tablet by mouth twice a day.  Orders: Specimen Handling (16109) Venipuncture (60454) TLB-BMP (Basic Metabolic Panel-BMET) (80048-METABOL)  BP today: 132/80 Prior BP: 126/86 (04/23/2010)  Labs Reviewed: K+: 4.9 (04/24/2010) Creat: : 1.8 (04/24/2010)   Chol: 110 (04/24/2010)   HDL: 36.40 (04/24/2010)   LDL: 61 (04/24/2010)   TG: 62.0 (04/24/2010)  Complete Medication List: 1)  Enalapril Maleate 5 Mg Tabs (Enalapril maleate) .... One two times a day 2)  Metoprolol Tartrate 100 Mg Tabs (Metoprolol tartrate) .Marland Kitchen.. 1 tablet by mouth twice a day. 3)  Niaspan 750 Mg Tbcr (Niacin (antihyperlipidemic)) .... Take 2 tablets at bedtime. 4)  Simvastatin 40 Mg Tabs (Simvastatin) .... 2po qhs 5)  Aspirin Tabs (Aspirin tabs) .... 2 daily 6)  Coq10 Caps (Coenzyme q10 caps) 7)  Fish Oil Caps (Omega-3 fatty acids caps) .Marland Kitchen.. 1 by mouth once daily 8)  Alprazolam 0.5 Mg Tabs (Alprazolam) .Marland Kitchen.. 1-2 tablets daily single dose 9)  Flax Seed Oil 1000 Mg Caps (Flaxseed (linseed)) .Marland Kitchen.. 1 by mouth two times a day 10)  Zantac 150 Mg Tabs (Ranitidine hcl) .Marland Kitchen.. 1 by mouth once daily 11)  Astepro 0.15 % Soln (Azelastine hcl) .... 2 sprays at nigh on each side   Orders Added: 1)  Specimen Handling [99000] 2)  Venipuncture [36415] 3)  TLB-BMP (Basic Metabolic Panel-BMET) [80048-METABOL] 4)  Est. Patient Level I [  99211] 

## 2010-05-22 NOTE — Progress Notes (Signed)
Summary: REFILL  Phone Note Refill Request Call back at [phone] Message from:  Pharmacy on May 14, 2010 2:40 PM  Refills Requested: Medication #1:  ALPRAZOLAM 0.5 MG TABS 1-2 tablets daily single dose [facility]  Next Appointment Scheduled: 6.19.12 Initial call taken by: Lavell Islam,  May 14, 2010 2:41 PM  Follow-up for Phone Call        last refilled- 04-06-10 Follow-up by: Army Fossa CMA,  May 14, 2010 2:43 PM  Additional Follow-up for Phone Call Additional follow up Details #1::        ok 60, 2 RF Jose E. Paz MD  May 14, 2010 5:04 PM     Prescriptions: ALPRAZOLAM 0.5 MG TABS (ALPRAZOLAM) 1-2 tablets daily single dose  #60 x 2   Entered by:   Doristine Devoid CMA   Authorized by:   Nolon Rod. Paz MD   Signed by:   Doristine Devoid CMA on 05/15/2010   Method used:   Printed then faxed to ...       [facility] [phone]* (retail)       [address] [place], [place]  [address]       Ph: [phone]       Fax: [phone]   RxID:   [id number]

## 2010-06-05 ENCOUNTER — Other Ambulatory Visit: Payer: Self-pay | Admitting: *Deleted

## 2010-06-05 NOTE — Telephone Encounter (Signed)
Denied, we sent 60 tablets and 2 refills on 05-15-2010

## 2010-06-14 LAB — DIFFERENTIAL
Basophils Absolute: 0.1 10*3/uL (ref 0.0–0.1)
Eosinophils Absolute: 0.8 10*3/uL — ABNORMAL HIGH (ref 0.0–0.7)
Eosinophils Relative: 8 % — ABNORMAL HIGH (ref 0–5)
Lymphs Abs: 1 10*3/uL (ref 0.7–4.0)
Monocytes Absolute: 0.8 10*3/uL (ref 0.1–1.0)

## 2010-06-14 LAB — BASIC METABOLIC PANEL
BUN: 22 mg/dL (ref 6–23)
Chloride: 101 mEq/L (ref 96–112)
Creatinine, Ser: 1.3 mg/dL (ref 0.4–1.5)
Glucose, Bld: 102 mg/dL — ABNORMAL HIGH (ref 70–99)
Potassium: 4.9 mEq/L (ref 3.5–5.1)

## 2010-06-14 LAB — POCT CARDIAC MARKERS
CKMB, poc: <1 ng/mL — ABNORMAL LOW (ref 1.0–8.0)
Troponin i, poc: <0.05 ng/mL (ref 0.00–0.09)

## 2010-06-14 LAB — CBC
HCT: 47.7 % (ref 39.0–52.0)
MCHC: 33.7 g/dL (ref 30.0–36.0)
MCV: 98.5 fL (ref 78.0–100.0)
Platelets: 117 10*3/uL — ABNORMAL LOW (ref 150–400)
RDW: 11.5 % (ref 11.5–15.5)

## 2010-07-17 NOTE — Assessment & Plan Note (Signed)
[facility]                            CARDIOLOGY OFFICE NOTE   NAME:JENNINGSKathryn, Linarez                      MRN:          [id number]  DATE:12/03/2007                            DOB:          March 17, 1940    PRIMARY CARE PHYSICIAN:  Willow Ora, MD   REASON FOR PRESENTATION:  Evaluate the patient with coronary disease and  cardiomyopathy.   HISTORY OF PRESENT ILLNESS:  The patient presents for 60-month followup.  Actually it is a little sooner than a 35-month followup.  At the last  visit, he was having trouble with paying for his ARB, so I switched him  back to enalapril.  He has actually done well with this.  He has had no  new cardiovascular complaints.  He has had no new shortness breath,  denies any PND or orthopnea.  He has had no palpitations, presyncope, or  syncope.  He has had no chest discomfort, neck, or arm discomfort.   Of note, his blood pressure is elevated today.  However, I went back and  looked at his blood pressures recorded at other visits and this is an  aberration.  He is typically in the 120s-130s systolic with diastolics  well below 90.   PAST MEDICAL HISTORY:  1. Coronary artery disease (status post CABG with a LIMA to the LAD,      sequential SVG to intermediate and obtuse marginal, sequential SVG      to PDA and posterolateral).  2. Ischemic cardiomyopathy (EF had been 30-35%.  It improved to 50% on      August 23, 2003).  3. Hypertension.  4. Hyperlipidemia.  5. Osteoarthritis.  6. Gastroesophageal reflux disease.  7. Hiatal hernia.   ALLERGIES:  None.   MEDICATIONS:  1. Aspirin 81 mg daily.  2. Omeprazole 20 mg daily.  3. B complex.  4. Garlic.  5. Omega-3.  6. Simvastatin 80 mg nightly.  7. Coenzyme Q.  8. Multivitamin.  9. Triamterene and hydrochlorothiazide 37.5/25 daily.  10.Iron.  11.Niaspan 1500 mg nightly.  12.Enalapril 5 mg b.i.d.   REVIEW OF SYSTEMS:  As stated in the HPI and otherwise negative for  other systems.   PHYSICAL EXAMINATION:  GENERAL:  The patient is in no distress.  VITAL SIGNS:  Blood pressure 154/78, heart rate 60 and regular, body  mass index 28, and weight 194 pounds.  NECK:  No jugular venous distention at 45 degrees.  Carotid upstrokes  brisk and symmetric.  No bruits, no thyromegaly.  LYMPHATICS:  No adenopathy.  LUNGS:  Clear to auscultation bilaterally.  BACK:  No costovertebral angle tenderness.  CHEST:  Well-healed sternotomy scar.  HEART:  PMI not displaced or sustained, S1 and S2 within normal limits.  No S3, no S4, no clicks, no rubs, no murmurs.  ABDOMEN:  Obese.  Positive bowel sounds normal in frequency and pitch,  no bruits, no rebound, no guarding, no midline pulsatile mass, no  organomegaly.  SKIN:  No rashes, no nodules.  EXTREMITIES:  2+ pulse, no edema.   ASSESSMENT/PLAN:  1. Coronary disease.  The patient is having no  ongoing chest pain.  No      further cardiovascular testing is suggested.  We will continue with      secondary risk reduction.  2. Cardiomyopathy.  His ejection fraction is about 50% which is about      where it was last time it was checked, but better than it was years      ago.  He is not having any heart failure symptoms.  At this point,      we will continue with the meds as listed.  3. Hypertension.  His blood pressure is controlled despite the reading      today.  He will continue with the meds as listed.  4. Dyslipidemia.  He is having trouble taking his Niaspan at night      because he was flushing.  We discussed strategies to avoid the      flushing, which includes taking his aspirin half hour before and      avoiding hot drinks.  He does already take it with food.  I      reviewed his lipids form and gave him a handout demonstrating that      his HDL is still too low.  When he goes back to see Dr. Drue Novel in      January, he will be fasting and get a fasting lipid profile.  He      may well need his Niaspan uptitrated  for a goal HDL of 40 or      greater.  5. Followup.  I will see back in 6 months and then hopefully yearly      thereafter.     Rollene Rotunda, MD, [facility]  Electronically Signed    JH/MedQ  DD: 12/03/2007  DT: 12/03/2007  Job #: [id number]   cc:   Willow Ora, MD

## 2010-07-17 NOTE — Assessment & Plan Note (Signed)
[facility]                            CARDIOLOGY OFFICE NOTE   NAME:JENNINGSDeonta, [name] Little                      MRN:          [id number]  DATE:05/03/2008                            DOB:          [birth year]    PRIMARY CARE PHYSICIAN:  Willow Ora, MD   REASON FOR PRESENTATION:  Evaluate the patient with coronary artery  disease and cardiomyopathy.   HISTORY OF PRESENT ILLNESS:  The patient presents for followup of the  above.  Since I last saw him, he has done well.  He has had no new chest  discomfort, neck or arm discomfort.  He has had no new palpitations,  presyncope, or syncope.  He has no PND or orthopnea.  He has not been  very active over the winter.  He does say that he gets active walking  during the spring and summer.  He does shopping and other things which  he thinks to be equivalent exercise.  However, it is clear on careful  discussion that this was not a sustained regimen.  With his level of  activities, he again does not have any of the above symptoms.  He  remains on the medications as listed.  I reviewed his blood pressures at  Dr. Leta Jungling office and they are always very well controlled, though they  are elevated here.  I reviewed his lipid profile.  His HDL remains low,  though his LDL is well below 70.  He is now tolerating the current dose  of Niaspan.   PAST MEDICAL HISTORY:  1. Coronary artery disease (status post  CABG with LIMA to the LADs,      sequential SVG to intermediate and obtuse marginal, sequential SVG      to PDA and posterolateral).  2. Ischemic cardiomyopathy (EF had been 30-35%.  The most recent EF      was 50% on echo in September 2009).  3. Hypertension.  4. Hyperlipidemia.  5. Osteoarthritis.  6. Gastroesophageal reflux disease.  7. Hiatal hernia.   ALLERGIES:  None.   MEDICATIONS:  1. Niaspan 1500 mg daily.  2. Enalapril 5 mg b.i.d.  3. Triamterene/hydrochlorothiazide 37.5/25 daily.  4. Multivitamin.  5.  Coenzyme Q.  6. Metoprolol 100 mg b.i.d.  7. Simvastatin 80 mg at bedtime.  8. Omega 3.  9. Omeprazole 20 mg daily.  10.Aspirin 81 mg daily.   REVIEW OF SYSTEMS:  As stated in the HPI and otherwise negative for all  other systems.   PHYSICAL EXAMINATION:  GENERAL:  The patient is pleasant and in no  distress.  VITAL SIGNS:  Blood pressure 150/80, heart rate 94 and regular, weight  191 pounds, and body mass index 28.  HEENT:  Eyelids unremarkable; pupils are equal, round, and react to  light; fundi not visualized; oral mucosa unremarkable.  NECK:  No jugular venous distention at 45 degrees, carotid upstroke  brisk and symmetric, right carotid bruit, no thyromegaly.  LYMPHATICS:  No cervical, axillary, or inguinal adenopathy.  LUNGS:  Clear to auscultation bilaterally.  BACK:  No costovertebral angle tenderness.  CHEST:  Unremarkable.  HEART:  PMI not displaced or sustained; S1 and S2 within normal limits,  no S3, no S4; no clicks, no rubs, no murmurs.  ABDOMEN:  Obese; positive bowel sounds, normal in frequency and pitch;  no bruits, no rebound, no guarding, no midline pulsatile mass; no  hepatomegaly, no splenomegaly.  SKIN:  No rashes, no nodules.  EXTREMITIES:  Pulses 2+ throughout, no edema, no cyanosis, no clubbing.  NEURO:  Oriented to person, place, and time; cranial nerves II through  XII grossly intact; motor grossly intact.   EKG; sinus rhythm, rate 94, axis within normal limits, intervals within  normal limits, old inferior infarct, old anteroseptal infarct, no acute  ST-T wave changes, no changes compared with previous EKGs.   ASSESSMENT AND PLAN:  1. Coronary artery disease.  The patient is having no new symptoms.      No further cardiovascular testing is suggested.  He should continue      with aggressive risk reduction.  2. Carotid bruit.  This is the first time I am hearing this.  I am      going to send him for carotid Doppler.  3. Dyslipidemia.  We had a  long discussion about this.  His LDL is at      target.  However, his HDL is still too low.  At this point, I would      like him to walk more to try to bring this up.  He understands that      this could increase by 10%  points if he walks regularly.  This      might get him close if not to the target of 40.  4. Obesity.  He understands the need to lose weight with diet and      exercise.  5. Cardiomyopathy.  His ejection fraction is improved.  He has class I      symptoms.  He will continue on the meds as listed.  6. Risk reduction.  I did spend a long time discussing an exercise      regimen with the patient and his wife.  Hopefully, he will begin to      do this.  7. Followup.  I would like to see him back in 6 months given his      multiple medical problems and high risk.     [name] Rotunda, MD, [facility]  Electronically Signed    JH/MedQ  DD: 05/03/2008  DT: 05/04/2008  Job #: [id number]   cc:   Willow Ora, MD

## 2010-07-17 NOTE — Assessment & Plan Note (Signed)
[facility]                                 ON-CALL NOTE   NAME:[name] Little, [name] Little                      MRN:          [id number]  DATE:07/25/2007                            DOB:          1940-12-17    Time of interaction:  8:13 a.m.  Phone #:  [phone]   OBJECTIVE:  The patient was bit by a tick 3 days ago.  It is reddened,  more swollen daily, warm, no fever, and no rash; otherwise would like to  be seen.  Primary care Allisen Pidgeon is Dr. Drue Novel, told to come in to the  office at [facility]  to be seen on Saturday morning.  Primary care Kennon Encinas  is Dr. Drue Novel and home office is [place].     Arta Silence, MD  Electronically Signed    RNS/MedQ  DD: 07/25/2007  DT: 07/26/2007  Job #: [id number]

## 2010-07-17 NOTE — Assessment & Plan Note (Signed)
[facility]                            CARDIOLOGY OFFICE NOTE   NAME:JENNINGSJuanmiguel, [name]                      MRN:          [id number]  DATE:08/03/2007                            DOB:          Nov 24, 1940    PRIMARY CARE PHYSICIAN:  Dr. Willow Ora.   REASON FOR PRESENTATION:  Evaluate patient with coronary artery disease  and cardiomyopathy.   HISTORY OF PRESENT ILLNESS:  The patient returns for yearly follow-up.  He has been doing well since I last saw him.  From a cardiovascular  standpoint, he says he has had no complaints.  He has had no new dyspnea  or exercise intolerance which was his previous angina.  He is doing  activities of daily living including yard work.  He peddles a bicycle  occasionally.  With this, he denies any chest pressure, neck or arm  discomfort.  He has had no palpitation, presyncope or syncope.  He  denies any PND or orthopnea.   Of note, his blood pressure is elevated as it has been consistently at  appointments.  I do note that he was taken off of his enalapril since  the last time I saw him.  I pulled Dr. Leta Jungling notes and do see enalapril  listed as an allergy.  However, the patient does not recall any  coughing.  He clearly had no anaphylaxis or angioedema.  He does not  recall what that allergy intolerance might have been and again I  questioned carefully.   PAST MEDICAL HISTORY:  1. Coronary artery disease (status post CABG with a LIMA to the LAD,      sequential saphenous vein graft to intermediate and obtuse      marginal, sequential saphenous vein graft to PDA and      posterolateral).  2. Ischemic cardiomyopathy (EF had been 30-35%.  It improved to 50%      August 23, 2003).  3. Hypertension.  4. Hyperlipidemia.  5. Osteoarthritis.  6. Gastroesophageal reflux disease.  7. Hiatal hernia.   ALLERGIES:  None.   MEDICATIONS:  1. Aspirin 81 mg daily.  2. Omeprazole 20 mg daily.  3. B complex.  4. Garlic.  5.  Omega III.  6. Simvastatin 80 mg nightly.  7. Niaspan 500 mg q.a.m. and 1000 mg q.p.m.  8. Metoprolol 100 mg b.i.d.  9. Coenzyme Q.  10.Multivitamin.  11.Triamterene/hydrochlorothiazide 37.5/25 daily.  12.Iron.   REVIEW OF SYSTEMS:  As stated in the HPI, otherwise negative for other  systems.   PHYSICAL EXAMINATION:  GENERAL APPEARANCE:  The patient is in no  distress.  VITAL SIGNS:  Blood pressure 162/83, heart 60 and regular, weight 200  pounds.  HEENT:  Eyes lids unremarkable.  Pupils equal, round and reactive to  light.  Fundi not visualized.  Oral mucosa unremarkable.  NECK:  No jugular venous distension at 45 degrees, carotid upstroke  brisk and symmetric.  No bruits, no thyromegaly.  LYMPHATICS:  No cervical, axillary or inguinal adenopathy.  LUNGS:  Clear to auscultation bilaterally.  BACK:  No costovertebral angle tenderness.  CHEST:  Unremarkable.  HEART:  PMI not displaced or sustained, S1 and S2 within normal limits.  No S3, no S4, no clicks, no rubs, no murmurs.  ABDOMEN:  Obese, positive bowel sounds normal in frequency and pitch, no  bruits, rebound, guarding or midline pulsatile mass.  No hepatomegaly,  no splenomegaly.  SKIN:  No rashes, no nodules.  EXTREMITIES:  He had 2+ pulses, no edema, cyanosis or clubbing.  NEURO:  Oriented to person, place and time.  Cranial nerves II-XII  grossly intact.  Motor grossly intact.   ASSESSMENT/PLAN:  1. Coronary disease.  Patient is having no new symptoms.  No further      cardiovascular testing is suggested.  He will continue with risk      reduction.  2. Hypertension.  His blood pressure is consistently elevated.  I have      carefully questioned him about his potential reaction or      intolerance to ACE inhibitor.  He did not have any cough.  He did      not have angioedema or anaphylaxis.  This is a medicine that I      would prefer for him to be on given his previous ischemic      cardiomyopathy.  He had had  trouble paying for an ARB.  Therefore,      I am going to rechallenge him with 5 mg of enalapril b.i.d.  He      will let me know if he has any problems taking this.  3. Cardiomyopathy.  The patient has previous cardiomyopathy as      described.  It responded well to beta blockers and ACE inhibitors      and again, I would like to restart the ACE inhibitor.  4. Dyslipidemia, per Dr. Drue Novel.  The goal will be an LDL less than 100,      HDL greater than 40.  5. Obesity.  Understands the need to lose weight with diet and      exercise.  6. Follow-up.  I would like see him back in about six months or sooner      if needed.  He is going to get a follow-up echocardiogram since he      has not been evaluated since 2005.  I am      going to make sure his ejection fraction is still as good as it was      at that time.  He is also going to have a BMET in two weeks since      we are restarting the ACE inhibitor.     Rollene Rotunda, MD, [facility]  Electronically Signed    JH/MedQ  DD: 08/03/2007  DT: 08/03/2007  Job #: [id number]   cc:   Willow Ora, MD

## 2010-07-17 NOTE — Assessment & Plan Note (Signed)
[facility]                            CARDIOLOGY OFFICE NOTE   NAME:[name] Little, [name] Little                      MRN:          [id number]  DATE:07/24/2006                            DOB:          02/25/1941    REFERRING PHYSICIAN:  Willow Ora, MD   REASON FOR PRESENTATION:  Evaluate patient for coronary disease.   HISTORY OF PRESENT ILLNESS:  The patient presents for followup of the  above. He is now 70 years old. It has been a little over a year since I  last saw him. From a cardiovascular standpoint, he has done well since I  last saw him. He has had some stress in his life. His mom died and he  lost his job. He has been somewhat depressed with this. He stays active  though. He mows lawns. He exercises on a bicycle. He denies any chest  pressure, neck discomfort, arm discomfort, activity-induced nausea,  vomiting, excessive diaphoresis. He has had no palpitations, pre-syncope  or syncope. He has had no PND or orthopnea.   PAST MEDICAL HISTORY:  1. Coronary artery disease, status post coronary artery bypass graft      (Left internal mammary artery (LIMA) to the left anterior      descending artery (LAD), sequential saphenous vein graft (SVG) to      the intermediate and obtuse marginal, sequential saphenous vein      graft (SVG) to descending artery and posterolateral).  2. Ischemic cardiomyopathy (ejection fraction had been 30-35% at the      time of catheterization, it was 50% on August 23, 2003).  3. Hypertension.  4. Hyperlipidemia.  5. Osteoarthritis.  6. Gastroesophageal reflux disease.  7. Hiatal hernia.   ALLERGIES:  None.   MEDICATIONS:  1. Aspirin 81 mg daily.  2. Omeprazole 20 mg daily.  3. Vitamin B complex.  4. Garlic.  5. Omega-3.  6. Simvastatin 80 mg q nightly.  7. Niaspan 500 mg q a.m. and 1000 mg q p.m.  8. Metoprolol 100 mg b.i.d.  9. Lexapro 10 mg q nightly.  10.Enalapril 10 mg b.i.d.  11.Co-enzyme Q.  12.Multivitamin.   REVIEW OF SYSTEMS:  As stated in the HPI and otherwise negative for  other systems.   PHYSICAL EXAMINATION:  The patient is in no distress. Blood pressure  152/78, heart rate 60 and regular, weight 205 pounds, body mass index is  29.  HEENT: Eyelids unremarkable. Pupils equal, round, and reactive to light.  Fundi not visualized. Oral mucosa is unremarkable.  NECK: No jugular venous distention at 45 degrees.  Carotid upstroke  brisk and symmetrical. No bruits, no thyromegaly.  LYMPHATICS: No cervical, axillary or inguinal adenopathy.  LUNGS: Clear to auscultation bilaterally.  BACK: No costovertebral angle tenderness.  CHEST: Well-healed sternotomy scar.  HEART: PMI not displaced or sustained. S1, S2 within normal limits. No  S3. No S4. No clicks, rubs or murmurs.  ABDOMEN: Obese, positive bowel sounds, normal in frequency and pitch. No  bruits. No rebounds. No guarding. No midline pulsatile mass. No  hepatomegaly, splenomegaly.  SKIN: No rashes, no nodules.  EXTREMITIES:  2+ pulses throughout. No cyanosis or clubbing.  NEURO: Oriented to person, place and time. Cranial nerves II-XII grossly  intact. Motor grossly intact throughout.   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 No acute ST-T wave changes. Nonspecific lateral T-wave  flattening.   ASSESSMENT/PLAN:  1. Coronary disease. The patient is having no ongoing symptoms. No      further cardiovascular testing is suggested. He needs to continue      with aggressive risk reduction.  2. Hypertension. Blood pressure is slightly elevated. However,      whenever he gets it checked elsewhere it is not. At this point, he      is going to keep a blood pressure diary and let us know. He will      share this with Dr.  Drue Novel.  3. Dyslipidemia. He says he gets his lipids checked routinely. The      goal would be an LDL of less than 100 and HDL  greater than 40. I      will defer to Dr.  Drue Novel.  4. Obesity. We talked about the need to lose weight with diet and      exercise. He thinks his depression is playing a role in this and it      probably is. He will try continued lifestyle modification.  5. Followup. I will see him back in one year or sooner if needed.     Rollene Rotunda, MD, [facility]  Electronically Signed    JH/MedQ  DD: 07/24/2006  DT: 07/24/2006  Job #: [id number]   cc:   Willow Ora, MD

## 2010-07-17 NOTE — Assessment & Plan Note (Signed)
[facility]                         GASTROENTEROLOGY OFFICE NOTE   NAME:[name] Little                      MRN:          [id number]  DATE:10/29/2006                            DOB:          [birth year]    REFERRING PHYSICIAN:  Willow Ora, MD   NEW GI CONSULTATION.   REASON FOR REFERRAL:  Dr. Drue Little asked me to evaluate [name] Little in  consultation regarding iron deficiency.   HPI:  [name] Little is a very pleasant 70 year old man who has never had  any overt GI bleeding.  He was recently found to have iron deficiency on  recent routine testing.  I do not have a CBC but I do believe he was  also anemic, we are working to get that sent over here.  His iron was  40, his ferritin was 16.8, both of which are low.  He has never been  told he was anemic before.  He did have a colonoscopy he believes 15 to  20 years ago and does not recall hearing about any polyps.  He moves his  bowels once daily without any difficulty.   REVIEW OF SYSTEMS:  Notable for a stable weight, otherwise essentially  normal and is available on his Nursing Intake Sheet.   PAST MEDICAL HISTORY:  1. Hypertension.  2. Coronary artery disease.  3. Elevated cholesterol.  4. Arthritis.  5. Anxiety.  6. Depression.  7. Coronary artery bypass 5 to 6 years ago.   CURRENT MEDICINES:  1. Aspirin 81 mg once daily.  2. Omeprazole B complex.  3. Garlic.  4. Omega-3.  5. Simvastatin.  6. Niaspan.  7. Metoprolol.  8. Coenzyme Q10.  9. Multivitamin.  10.Lexapro.  11.Triamterene/hydrochlorothiazide.  12.Iron 325 mg twice a day.   ALLERGIES:  No known drug allergies.   SOCIAL HISTORY:  1. Married.  2. Lives with his wife.  3. No children.  4. Nonsmoker.  5. Nondrinker.   FAMILY HISTORY:  1. Heart disease runs in his family.  2. His aunt had colon cancer.  3. No first degree relatives with colon cancer that he is aware of.   PHYSICAL EXAM:  He is 5 feet 10 inches, 206 pounds,  blood pressure  130/80, pulse 64.  CONSTITUTIONAL:  Generally well appearing.  NEUROLOGIC:  Alert and oriented x3.  EXTRAOCULAR MOVEMENTS:  Intact.  MOUTH:  Oropharynx moist, no lesions.  NECK:  Supple, no lymphadenopathy.  CARDIOVASCULAR:  Heart regular rate and rhythm.  LUNGS:  Clear to auscultation bilaterally.  ABDOMEN:  Soft, nontender, nondistended, normal bowel sounds.  EXTREMITIES:  No lower extremity edema.  SKIN:  No rash or lesions on visible extremities.   ASSESSMENT AND PLAN:  A 70 year old man recently found to have iron  deficiency.   I believe he is also anemic.  I do not have those laboratories at this  point, but we will get those sent over for review.  Either way, he is  iron deficient and I do believe he is anemic.  We will arrange for him  to have a colonoscopy performed at his soonest convenience.  He does  have colon cancer in his family, although it is a second degree  relative; with his iron deficiency, that is of a primary concern here.     [name] Fee, MD  Electronically Signed    DPJ/MedQ  DD: 10/29/2006  DT: 10/30/2006  Job #: [id number]   cc:   [name] Ora, MD

## 2010-07-20 NOTE — Consult Note (Signed)
NAME:  [name] Little, [name] Little                       ACCOUNT NO.:  [id number]   MEDICAL RECORD NO.:  [id number]                   PATIENT TYPE:  INP   LOCATION:  2005                                 FACILITY:  [facility]   PHYSICIAN:  Evelene Croon, M.D.                  DATE OF BIRTH:  10/15/1940   DATE OF CONSULTATION:  11/05/2002  DATE OF DISCHARGE:                                   CONSULTATION   CARDIOVASCULAR THORACIC SURGERY CONSULTATION:   REFERRING PHYSICIAN:  Rollene Rotunda, M.D.   REASON FOR CONSULTATION:  Severe three vessel coronary artery disease with  moderate left ventricular dysfunction.   HISTORY OF PRESENT ILLNESS:  The patient is a 70 year old gentleman with no  prior cardiac history who reported a several-year history of aching dull  pain in his jaw.  This was associated with some flushing and tingling in his  face.  It usually happens in the morning or when it gets cold or when he is  in air conditioning.  He denies any chest pain or pressure.  He has also had  at least 1 year of fatigue and feeling poorly in general.  He was recently  referred for cardiology evaluation.  A nuclear stress test showed a large  inferolateral defect extending from the apex to the base that was  essentially fixed.  There was also a large anteroapical defect that  exhibited partial reversibility in the anterior septal as well as in the  apical anterolateral wall.  The ejection fraction was calculated at 28 and  7% with apical akinesis and otherwise diffuse global hypokinesis.  The  patient also showed electrocardiogram changes and had dyspnea and fatigue  during the study.  He underwent cardiac catheterization today which showed  severe three vessel disease.  He had about 40% diffuse left main stenosis.  The LAD was occluded proximally at its takeoff with filling of the distal  vessel by collaterals.  The left circumflex gave off a large intermediate  branch that had about 30% stenosis.  The  left circumflex had about 80-90%  ostial stenosis.  There is about 70% proximal stenosis before a marginal  branch.  There is about 80% stenosis after this marginal branch.  The right  coronary artery was occluded proximally with filling of the distal vessel by  collaterals from the left.  PA pressure was 42/20 with a wedge of 19.  Right  atrial pressure was 14 with a RV pressure of 42/9.  There was no gradient  across the aortic valve.  Ejection fraction was estimated at 35% with  anterior hypokinesis and inferior akinesis.  Cardiac index was 1.85.  There  was no mitral regurgitation.   PAST MEDICAL HISTORY:  His past medical history is significant for  hypertension and hyperlipidemia.  He has a history of bilateral inguinal  hernia repair.  He is status post repair of a deviated nasal septum  in 1971.  He has no history of diabetes.   SOCIAL HISTORY:  He still works in a [employer].  He has a remote  history of smoking.  He lives with his wife.   FAMILY HISTORY:  Strongly positive for coronary disease.  I operated on his  mother several years ago for coronary disease.  His father died in the past  of coronary disease.   REVIEW OF SYSTEMS AS FOLLOWS:  GENERAL: He has had fatigue for at least 1  year.  He has had no recent weight changes.  He denies fever or chills.  CARDIOVASCULAR: As above.  He denies PND and orthopnea.  He has had no  peripheral edema or palpitations.  RESPIRATORY: He has had no cough or  sputum production.  GI: He does have a history of hiatal hernia with acid  reflux.  He denies melena or bright red blood per rectum.  GU: He has had no  dysuria or hematuria.  MUSCULOSKELETAL: He does have osteoarthritis.  NEUROLOGICAL: He has never had a TIA or stroke.  He has had no focal  weakness or numbness and no dizziness or syncope.  ENDOCRINE: No history of  diabetes or hypothyroidism.  ENT: Negative.   PHYSICAL EXAMINATION:  VITAL SIGNS:  His blood pressure is 108/62  and his  pulse is 63 and regular.  Respiratory rate is 18 and unlabored.  GENERAL:  He is a well-developed white male in no distress.  HEENT:  Normocephalic, atraumatic.  Pupils are equal and reactive to light  and accommodation.  Extraocular muscles are intact.  Throat is clear.  NECK:  Normal carotid pulses bilaterally.  There are no bruits.  There is no  adenopathy or thyromegaly.  CARDIAC:  Regular rate and rhythm with normal S1 and S2.  There is no  murmur, rub, or gallop.  LUNGS:  His lungs are clear.  ABDOMEN:  Active bowel sounds.  His abdomen is soft and nontender.  There  are no palpable masses or organomegaly.  EXTREMITIES:  No peripheral edema.  Pedal pulses are palpable bilaterally.  SKIN:  Warm and dry.  NEUROLOGIC:  Alert and oriented x3.  Motor and sensory exams grossly normal.   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  His electrolytes were normal with a BUN of  6 and a creatinine of 0.9.  Hemoglobin of 15.4 with a platelet count of  135,000.  His coagulation profile was within normal limits.  Chest x-ray is  still pending.  Carotid Doppler examination shows no evidence of internal or  carotid artery stenosis.   IMPRESSION:  Mr. Novick has left main and severe three vessel coronary  artery disease with documented ischemia by nuclear stress test.  I agree  that coronary artery bypass graft surgery is the best treatment for this  patient to prevent further ischemia and infarction and sudden death.  I  discussed the operative procedure with the patient and his family including  alternatives and risks including bleeding, blood transfusion, infection,  stroke, myocardial infarction, and death.  He understands and agrees to  proceed with surgery.  We will plan to perform this on Wednesday, November 10, 2002.  Evelene Croon, M.D.    BB/MEDQ  D:   11/05/2002  T:  11/06/2002  Job:  [id number]   cc:   [facility]

## 2010-07-20 NOTE — Cardiovascular Report (Signed)
NAME:  [name] Little, [name] Little                       ACCOUNT NO.:  [id number]   MEDICAL RECORD NO.:  [id number]                   PATIENT TYPE:  INP   LOCATION:  2005                                 FACILITY:  [facility]   PHYSICIAN:  Rollene Rotunda, M.D.                DATE OF BIRTH:  11/03/1940   DATE OF PROCEDURE:  11/05/2002  DATE OF DISCHARGE:                              CARDIAC CATHETERIZATION   PROCEDURE:  Left and right heart catheterization, coronary arteriography.   CARDIOLOGIST:  Rollene Rotunda, M.D.   INDICATIONS:  Patient with GI discomfort and cardiovascular risk factors.  He had an abnormal Cardiolite with inferior infarction, anteroapical infarct  with ischemia, early positive ETT and LV dilatation with stress.   DESCRIPTION OF PROCEDURE:  Left heart catheterization was performed via the  right femoral artery and vein, respectively.  Both vessels were cannulated  using an arterial wall puncture.  A #6 French arterial sheath and a #8  French venous sheath were inserted via the modified Seldinger technique.  Preformed Judkins and a pigtail catheter as well as a Swan-Ganz catheter  were utilized.  The patient tolerated the procedure well and left the lab in  stable condition.   RESULTS:  HEMODYNAMICS:  1. LV 147/77, AO 154/20.  2. RA mean 14. RV 42/9, PA 42/20 with a mean of 31.  3. Pulmonary capillary wedge pressure mean 19.  4. Cardiac output/cardiac index (Fick) 3.5/1.85.   CORONARY ARTERIOGRAPHY:  1. The left main coronary artery had a long 40% calcified lesion.  2. The LAD was occluded after a large first diagonal.  The distal vessel was     seen to fill via collaterals from the LAD and circumflex.  There was a     moderate size diagonal which appeared to be free of high grade disease.     The mid vessel appeared to be free of high grade disease.  There appeared     to be some diffuse apical disease.  The first diagonal was a large vessel     with proximal 50%  stenosis.  This came off before the total occlusion.  3. The circumflex had ostial 90% stenosis followed by 70% stenosis before     the first obtuse marginal and 80% stenosis after the obtuse marginal     leading into a second obtuse marginal.  4. The right coronary artery was occluded in the mid segment at the RV     branch.  This was a dominant vessel with PDA and posterior lateral.     There appeared to be moderate at least diffuse disease in the distal     vessel.   LEFT VENTRICULOGRAM:  The left ventriculogram was obtained in the RAO  projection.  The ejection fraction was 35% with anterior hypokinesis, apical  hypokinesis and inferior akinesis.   CONCLUSION:  1. Severe three vessel coronary artery disease.  2.  Moderate left ventricular dysfunction.  3. 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   PLAN:  The patient will be referred for CABG.                                               Rollene Rotunda, M.D.    JH/MEDQ  D:  11/05/2002  T:  11/05/2002  Job:  [id number]

## 2010-07-20 NOTE — H&P (Signed)
NAME:  [name] Little, [name] Little NO.:  [id number]   MEDICAL RECORD NO.:  [id number]                   PATIENT TYPE:  EMS   LOCATION:  [facility]                                 FACILITY:  [facility]   PHYSICIAN:  Melvyn Novas, M.D.               DATE OF BIRTH:  [birth year]   DATE OF ADMISSION:  06/01/2003  DATE OF DISCHARGE:                                HISTORY & PHYSICAL   HISTORY OF PRESENT ILLNESS:  The patient is a 70 year old right-handed  Caucasian gentleman who works as an Print production planner.  He states that he  suffers pure sensory paroxysmal events that involve the left side of his  face and then descend into the left arm and left body.  These events have  been frequent for many years and occur in spring and autumn.  He has never  had associated cranial nerve dysfunction, coordination problems, or motor  function loss.  The patient states that he had recently in September of 2004  undergone a coronary bypass surgery after which these spells have not  occurred until earlier this week.  He had one on Monday night, one  yesterday, and one today in the a.m. and they lasted an average of 5 to 15  minutes.  He called his primary care physician, Dr. __________, who told him  to go to the ER.  Yesterday, Dr. __________ made an appointment with Genene Churn. Love, M.D. of [facility] for an outpatient evaluation  of the same spells.  The patient states that he also has no history of  anxiety, depression, or panic attacks and when he had been experiencing  these spells in the past, was diagnosed with possible sinus allergies as a  cause.   PAST MEDICAL HISTORY:  Bypass surgery in September of 2004, cardiac rehab  now completed.  The patient is still working.  He has further been diagnosed  with borderline hypertension and hypercholesterolemia.   PHYSICAL EXAMINATION:  VITAL SIGNS:  Blood pressure 119/66, heart rate 88.   CURRENT MEDICATIONS:  1. Toprol XL  75 mg in the morning.  2. Aciphex 20 mg daily.  3. Zocor 20 mg b.i.d.  4. Aspirin 81 mg a day.  5. Altace 5 mg a day.  6. Plavix 75 mg daily.  7. Multiple vitamins and nutritional supplements daily.   LUNGS:  Clear to auscultation.  HEART:  Regular rate and rhythm, no murmur.  Well-healed anterior chest  scar.  ABDOMEN:  Soft and nontender.  No flank pain, no edema, no bruises.  All  peripheral pulses palpable.  NEUROLOGY:  Mental status alert and oriented x3.  The patient appears  dysarthric, but his wife states that this is his baseline.  The patient  himself also feels that there is no change in speech or swallowing capacity.  No apraxia.  Cranial nerves; pupils equal, round, and reactive to light and  accommodation.  No  papilledema, no facial asymmetry or sensory loss. Tongue  and uvula are midline.  Full extraocular movements.  Full rupture of  membranes for the neck.  Motor; 5/5 strength. Tone and mass bilaterally  equal.  Can extend all four extremities against gravity and resistance.  Downgoing toes to plantar stimulation.  Deep tendon reflexes throughout 1+.  Sensory intact to primary or cortical modalities throughout.  Coordination;  finger-to-nose and heel-to-shin intact. No gait abnormality observed.   ASSESSMENT:  1. Sensory abnormalities that appear paroxysmal and are not associated with     any other deficits.  They also have a seasonal character by a spring and     autumn occurrence and are limited to the left side of the body.  2. History of coronary artery disease, hypercholesterolemia, and     hypertension even if diagnosed as borderline.   PLAN:  The patient will complete his risk factor evaluation for possible  TIA's in the hospital.  MRI and MRA of neck and brain are ordered for today.  He will be admitted for 23-hour observation.  We will further look for other  reasons of paroxysmal spells; electrolyte abnormalities, metabolic  abnormalities, hormonal  abnormalities.                                                Melvyn Novas, M.D.    CD/MEDQ  D:  06/01/2003  T:  06/01/2003  Job:  [id number]

## 2010-07-20 NOTE — Op Note (Signed)
NAME:  [name] Little, [name] Little                       ACCOUNT NO.:  [id number]   MEDICAL RECORD NO.:  [id number]                   PATIENT TYPE:  INP   LOCATION:  2310                                 FACILITY:  [facility]   PHYSICIAN:  Evelene Croon, M.D.                  DATE OF BIRTH:  November 11, 1940   DATE OF PROCEDURE:  11/10/2002  DATE OF DISCHARGE:                                 OPERATIVE REPORT   PREOPERATIVE DIAGNOSIS:  Severe three vessel coronary artery disease with  moderate left ventricular dysfunction.   POSTOPERATIVE DIAGNOSIS:  Severe three vessel coronary artery disease with  moderate left ventricular dysfunction.   PROCEDURE:  Median sternotomy, extracorporal circulation, coronary artery  bypass graft surgery x5 using the left internal mammary artery graft to the  left anterior descending coronary graft with a sequential saphenous vein  graft to the intermediate and obtuse marginal coronary arteries and a  sequential saphenous vein graft to the posterior descending and posterior  lateral branches of the right coronary artery.  Endoscopic vein harvesting from the right thigh.   SURGEON:  Alleen Borne, M.D.   ASSISTANTS:  1. Rowe Clack, P.A.-C.  2. Pecola Leisure, P.A.-C.   ANESTHESIA:  General endotracheal.   INDICATIONS:  This patient is a 70 year old gentleman with no prior cardiac  history.  He reported a several year history of aching dull pain to his jaw.  He has had at least one year of fatigue and feeling poorly in general.  A  nuclear stress test recently showed a large inferior lateral defect  extending from the apex to the base that was essentially fixed.  There was  also a large anterior apical defect that exhibited partial reversibility in  the interior wall as well in the apical wall.  The ejection fraction was  calculated at 28% with apical akinesis and otherwise diffuse global  hypokinesis.  The patient also showed electrocardiogram changes and had  dyspnea in particular in the study.  He underwent cardiac catheterization  which showed severe three vessel disease with about 40% diffuse left main  stenosis.  The LAD was occluded proximally at its takeoff and filled by a  distal vessel collaterals.  The left circumflex gave off a large  intermediate branch and had about 30% stenosis and the left circumflex which  had about 80-90% ostial stenosis.  There was about 70% proximal stenosis  before the marginal branch.  There was about 80% stenosis after this  marginal branch with a small distal vessel.  The right coronary artery is  occluded proximally with filling of the distal vessel by collaterals from  the left.  The PA pressure was elevated at 42/20.  The right ventricular  pressure was 42/9.  There was no gradient across the aortic valve.  Ejection  fraction was estimated at 35% with anterior hypokinesis and inferior  akinesis.  Cardiac index was 1.85.  There  was no mitral regurgitation.   After review of the angiogram and examination of the patient, it was felt  that coronary artery bypass graft surgery was the best treatment.  I  discussed the operative procedure with the patient and his wife including  alternatives, benefits, and risks including bleeding, possible blood  transfusion, infection, stroke, myocardial infarction, graft failure and  death.  They understood and agreed to proceed.   DESCRIPTION OF PROCEDURE:  The patient was taken to the operating room and  placed on the table in the supine position.  After induction of general  endotracheal anesthesia, a Foley catheter was placed in the bladder using a  sterile technique.  Then the chest, abdomen and both lower extremities were  prepped and draped in the usual sterile manner.  The chest was entered  through a median sternotomy incision and the pericardium opened in the  midline.  Examination of the heart showed good ventricular contractility.  The ascending aorta had no  palpable plaque in it.   Then the left internal mammary artery was harvested from the chest wall as a  pedicle graft. This was a medium caliber vessel with excellent blood flow  through it.  At the same time, a segment of greater saphenous vein was  harvested from the right leg using endoscopic vein harvest technique.  This  vein was of medium size and of good quality.   Then the patient was heparinized and when an adequate activated clotting  time was achieved, the distal ascending aorta was cannulated using a 20  French aortic cannula for arterial inflow.  Venous outflow was achieved  using a two stage venous cannula through the right atrial appendage.  Antegrade cardioplegia and vent cannula were inserted in the aortic root.   The patient was placed on coronary pulmonary bypass and the distal coronary  arteries identified.  The LAD was a fairly small diffusely diseased vessel  that was graftable in its mid to distal portion.  The intermediate was a  large graftable vessel.  The obtuse marginal was a medium size graftable  vessel.  The distal left circumflex was lying in the atrioventricular  ventricular groove and lying beneath the fat and was not suitable for  grafting.  The right coronary artery gave off a small posterior descending  and a small posterior lateral branch.  These vessels were both diffusely  diseased but graftable.  There was evidence of previous inferior lateral  myocardial infarction.   Then the aorta was cross-clamped and 500 cc of cold blood antegrade  cardioplegia was administered in the aortic root with quick arrest of the  heart.  Systemic hypothermia to 20 degrees Centigrade and topical  hypothermia with iced saline was used.  A temperature probe was placed in  the septum and insulating pin in the pericardium.   The first distal anastomosis was performed to the posterior descending coronary artery.  The internal diameter was about 1.5 mm.  The conduit  used  was a segment of greater saphenous vein.  The anastomosis was performed in a  sequential end-to-side manner using continuous 7-0 Prolene suture.  Flow was  measured through the graft and was excellent.   The second distal anastomosis was performed in the posterior lateral branch.  The internal diameter of this vessel was also about 1.5 mm.  The conduit  used was the same segment of greater saphenous vein, and the anastomosis was  performed in a sequential end-to-side manner using continuous 7-0 Prolene  sutures.  The flow was measured through the graft, and was excellent.  Then  another dose of cardioplegia was given down the vein grafts and in the  aortic root.   The third distal anastomosis was performed to the intermediate coronary  artery.  The internal diameter was about 2.5 mm.  The conduit used was a  second segment of greater saphenous vein.  The anastomosis was performed in  a sequential side-to-side manner using continuous 7-0 Prolene suture.   Flow was measured through the graft and was excellent.   The fourth distal anastomosis was performed to the obtuse marginal branch.  The internal diameter of this vessel was 1.6 mm.  The conduit used was the  same segment of greater saphenous vein, and the anastomosis was performed in  a sequential end-to-side manner using continuous 7-0 Prolene suture.  The  flow was measured through the graft and was excellent.  Another dose of  cardioplegia was given down both vein grafts and into the aortic root.   The fifth distal anastomosis was performed in the mid to the distal portion  of the left anterior descending coronary artery.  The internal diameter of  this vessel was about 1.5-.156. mm.  The conduit used was the left internal  mammary graft.  This was brought through an opening in the left pericardium  anterior to the phrenic nerve.  This was anastomosed to the left anterior  descending in an end-to-side manner using continuous  8-0 Prolene suture.  The pedicle was tapped to the epicardium with 6-0 Prolene sutures.   The patient was rewarmed to 37 degrees Centigrade, and the clamp removed  from the mammary pedicle.  There was rapid rewarming of the ventricular  septum and return of spontaneous ventricular fibrillation.  The cross clamp  was removed with a time of 71 minutes, and the patient defibrillated into  sinus rhythm.   A partial occlusion clamp was placed in the aortic root and the four  proximal vein grafts were performed in an end-to-side manner using a  continuous 6-0 Prolene suture.  The clamp was removed and vein grafts  deaired, and clamps removed from the them.  The proximal and distal  anastomosis appeared hemostatic and lie of the graft was satisfactory.  Graft markers were placed around the proximal anastomosis.  Two temporary  right ventricular and right atrial pacing wires were placed and brought out  through the skin.  When the patient had rewarmed to 37 degrees centigrade, he was weaned from  cardiopulmonary bypass on no inotropic agents.  Total bypass time was 113  minutes.  Cardiac function appeared excellent, and the cardiac output and  the cardiac function appeared excellent.  The cardiac output was 7 liters  per minute.  Protamine was given, and the venous and aortic cannula were  removed without difficulty.  Hemostasis was achieved.  Three chest tubes  were placed with two in the posterior pericardium, one in the left pleural  space and one in the anterior mediastinum.  The pericardium was  reapproximated over the heart.  The sternum was closed with #6 stainless  steel wires.  The fascia was closed with continuous #1 Vicryl suture.  The  subcutaneous tissue was closed with a continuous 2-0 Vicryl, and the skin  with 3-0 Vicryl subcuticular closure.  The lower extremity vein harvest site  was closed in layers in a similar manner.  The sponge, needle and instrument  counts were correct  according to the scrub  nurse.  Dry sterile dressings  were applied over the incisions, around the chest tubes, which were hooked  to Pleuravac suction.  The patient remained hemodynamically stable and was  transported to the SICU in guarded but stable condition.                                               Evelene Croon, M.D.    BB/MEDQ  D:  11/10/2002  T:  11/11/2002  Job:  [id number]   cc:   Rollene Rotunda, M.D.   [facility]   Cardiac Catheterization Lab

## 2010-07-20 NOTE — Discharge Summary (Signed)
NAME:  [name] Little, [name] Little                       ACCOUNT NO.:  [id number]   MEDICAL RECORD NO.:  [id number]                   PATIENT TYPE:  INP   LOCATION:  2019                                 FACILITY:  [facility]   PHYSICIAN:  Evelene Croon, M.D.                  DATE OF BIRTH:  09-30-1940   DATE OF ADMISSION:  11/04/2002  DATE OF DISCHARGE:  11/16/2002                                 DISCHARGE SUMMARY   ADMISSION DIAGNOSIS:  Chest pain.   DISCHARGE DIAGNOSES:  1. Severe three-vessel coronary artery disease.  2. Hypertension.  3. Hyperlipidemia.  4. Osteoarthritis.  5. Gastroesophageal reflux.  6. Hiatal hernia.  7. Postoperative bronchitis.   PROCEDURES:  1. Cardiac catheterization.  2. Coronary artery bypass grafting x5 (left internal mammary artery to the     left anterior descending, saphenous vein graft sequentially to the     posterior descending artery, saphenous vein graft sequentially to the     intermediate and obtuse marginal).  3. Endoscopic vein harvest, right thigh.   HISTORY OF PRESENT ILLNESS:  The patient is a 70 year old, white male with  no prior cardiac history.  Over the past year, he has had intermittent  episodes aching, dull pain to his jaw.  Recent nuclear stress test showed a  large inferior lateral defect extending from the apex to the base that was  essentially fixed.  There was also a large anterior apical defect that  exhibited partial reversibility in the anterior wall as well as the apical  wall.  An ejection fraction was calculated at 28% with apical akinesis and  otherwise diffuse global hypokinesis.  He also had EKG changes and dyspnea  during the study.  Because of these reasons, he was brought in for cardiac  catheterization on November 04, 2002.   HOSPITAL COURSE:  Cardiac catheterization showed severe three-vessel  coronary artery disease with 40% diffuse left main stenosis.  He was felt to  be a poor candidate for percutaneous coronary  intervention.  Cardiothoracic  surgery consultation was obtained.  The patient was seen by Dr. Laneta Little.  It  was agreed that he was a good candidate for surgical revascularization.  After discussion with the patient and his family and explanation of the  risks, benefits and alternatives, the patient agreed to proceed.  He was  taken to the operating room on November 10, 2002, and underwent CABG x5  described in detail above.  He tolerated the procedure well and was  transferred to SICU in stable condition.  He was extubated shortly after  surgery.  He was hemodynamically stable and doing well on postop day #1.  At  that time, he was able to be transferred to the floor.  Postoperatively, he  has done well.  He has been mildly volume overloaded and has been diuresed  back down to within two pounds of his preoperative weight.  His only  postoperative difficulty has been fever which developed on postop day #3.  He spiked a temperature up to 101.8.  He also had a productive cough with  some discolored sputum.  He was started on Avelox and was treated with  aggressive pulmonary toilet measures.  Since that time, his fever has  resolved.  His white blood cell count is stable at 7.5.  His other labs have  remained stable as well.  His hemoglobin was 9.7, hematocrit 27, platelets  215, BUN 15, creatinine 1.1.  He had no other sources noted for fever and  urinalysis was negative.  He has been afebrile now for 24 hours and is doing  fairly well.  He is tolerating a regular diet and is having normal bowel and  bladder function.  He is ambulating in the halls without difficulty.  He has  been weaned off supplemental oxygen and is maintaining O2 saturations of 95%  or greater on room air.  It is felt at this time that he may be discharged  home.   DISCHARGE MEDICATIONS:  1. Enteric coated aspirin 325 mg q.d.  2. Zocor 40 mg q.h.s.  3. Avelox 400 mg q.d. x1 week.  4. Toprol XL 50 mg q.d.  5. Doxepin  25 mg q.h.s.  6. Aciphex 20 mg q.d.  7. Crestor 10 mg q.d.  8. Terazosin 10 mg q.d.  9. Tylox one to two q.4h. p.r.n. pain.   SPECIAL INSTRUCTIONS:  He is to refrain from driving, heavy lifting or  strenuous activity.  He will continue daily walking and use of his incentive  spirometer.  He may shower daily and clean his incisions with soap and  water.  He will continue a low fat, low sodium diet.   FOLLOW UP:  He will see Dr. Antoine Little in the office on September 27, at noon  and will have a chest x-ray at that visit.  He will follow up with Dr.  Laneta Little on October 12, at 3 p.m.  He is asked to bring his chest x-ray to  this appointment for Dr. Laneta Little to review.  In the interim, he should call  our office if he experiences any problems or has questions.      Coral Ceo, P.A.                        Evelene Croon, M.D.    GC/MEDQ  D:  11/16/2002  T:  11/16/2002  Job:  [id number]   cc:   Rollene Rotunda, M.D.

## 2010-07-27 ENCOUNTER — Other Ambulatory Visit: Payer: Self-pay | Admitting: Internal Medicine

## 2010-08-03 ENCOUNTER — Other Ambulatory Visit: Payer: Self-pay | Admitting: Internal Medicine

## 2010-08-16 ENCOUNTER — Encounter: Payer: Self-pay | Admitting: Internal Medicine

## 2010-08-21 ENCOUNTER — Ambulatory Visit (INDEPENDENT_AMBULATORY_CARE_PROVIDER_SITE_OTHER): Payer: Medicare Other | Admitting: Internal Medicine

## 2010-08-21 ENCOUNTER — Encounter: Payer: Self-pay | Admitting: Internal Medicine

## 2010-08-21 DIAGNOSIS — D696 Thrombocytopenia, unspecified: Secondary | ICD-10-CM

## 2010-08-21 DIAGNOSIS — R220 Localized swelling, mass and lump, head: Secondary | ICD-10-CM

## 2010-08-21 DIAGNOSIS — E785 Hyperlipidemia, unspecified: Secondary | ICD-10-CM

## 2010-08-21 DIAGNOSIS — E119 Type 2 diabetes mellitus without complications: Secondary | ICD-10-CM

## 2010-08-21 DIAGNOSIS — J309 Allergic rhinitis, unspecified: Secondary | ICD-10-CM

## 2010-08-21 DIAGNOSIS — K219 Gastro-esophageal reflux disease without esophagitis: Secondary | ICD-10-CM

## 2010-08-21 DIAGNOSIS — N189 Chronic kidney disease, unspecified: Secondary | ICD-10-CM

## 2010-08-21 DIAGNOSIS — E291 Testicular hypofunction: Secondary | ICD-10-CM

## 2010-08-21 DIAGNOSIS — Z0000 Encounter for general adult medical examination without abnormal findings: Secondary | ICD-10-CM | POA: Insufficient documentation

## 2010-08-21 DIAGNOSIS — Z125 Encounter for screening for malignant neoplasm of prostate: Secondary | ICD-10-CM

## 2010-08-21 DIAGNOSIS — I10 Essential (primary) hypertension: Secondary | ICD-10-CM

## 2010-08-21 LAB — CBC WITH DIFFERENTIAL/PLATELET
Basophils Absolute: 0 10*3/uL (ref 0.0–0.1)
Eosinophils Relative: 16 % — ABNORMAL HIGH (ref 0.0–5.0)
HCT: 44.7 % (ref 39.0–52.0)
Lymphocytes Relative: 20.1 % (ref 12.0–46.0)
Monocytes Relative: 7.7 % (ref 3.0–12.0)
Neutrophils Relative %: 55.5 % (ref 43.0–77.0)
Platelets: 102 10*3/uL — ABNORMAL LOW (ref 150.0–400.0)
RDW: 13.9 % (ref 11.5–14.6)
WBC: 6.6 10*3/uL (ref 4.5–10.5)

## 2010-08-21 LAB — HEMOGLOBIN A1C: Hgb A1c MFr Bld: 6.4 % (ref 4.6–6.5)

## 2010-08-21 LAB — BASIC METABOLIC PANEL
Chloride: 109 mEq/L (ref 96–112)
Creatinine, Ser: 1.1 mg/dL (ref 0.4–1.5)
GFR: 68.74 mL/min (ref >60.00)

## 2010-08-21 LAB — PSA: PSA: 0.6 ng/mL (ref 0.10–4.00)

## 2010-08-21 NOTE — Assessment & Plan Note (Signed)
Well controlled 

## 2010-08-21 NOTE — Progress Notes (Signed)
  Subjective:    Patient ID: [name] Little, male    DOB: 14-Jun-1940, 70 y.o.   MRN: [id number]  HPI Here for Medicare AWV:  1. Risk factors based on Past M, S, F history: reviewed 2. Physical Activities: active, yard work, home chores, no routine exercise   3. Depression/mood:  Sx well controlled on xanax  4. Hearing:  No problems reported or noted w/ normal conversation 5. ADL's:  Independent  6. Fall Risk: no recent accidents, low risk, precautions discussed  7. home Safety: does feel safe at home  8. Height, weight, &visual acuity: see VS, sees eye doctor yearly  9. Counseling: provided 10. Labs ordered based on risk factors: if needed  11. Referral Coordination: if needed 12.  Care Plan, see assessment and plan  13.   Cognitive Assessment: Motor skills and cognition appropriate for age  In addition, today we discussed the following: Borderline DM-- life style treatment only, no amb CBGs HTN-- amb BPs wnl , always < 130 CAD-- last OV w/ cards few months ago, was doing ok GERD--- no classic sx , "choking in the throat " sometimes   Past Medical History  Diagnosis Date  . Anxiety and depression   . Hyperlipidemia   . Hypertension   . Diabetes mellitus   . GERD (gastroesophageal reflux disease)   . PUD (peptic ulcer disease) 7/11    EGD showed few small ulcers, esophagues strecthed  . Headache 06/2003    s/p neuro eval ? migraine  . CAD (coronary artery disease)   . Hyperplastic colon polyp 2008  . Iron deficiency   . Arthritis   . Ischemic cardiomyopathy     EF had 30-35%. The most recent EF was 50% on echo in september 2009  . Allergic rhinitis     Family History: DM--father colon ca--no prostate ca--no MI--M and F  Social History: Married lives w/ wife no children tobacco-- quit in the 1s ETOH-- no diet--does watch most of the time  exercise--no but active  Review of Systems  Constitutional: Negative for fever and fatigue.  Respiratory: Negative for  cough, shortness of breath and wheezing.   Cardiovascular: Negative for chest pain and leg swelling.  Gastrointestinal: Negative for nausea, abdominal pain, diarrhea and blood in stool.  Genitourinary: Negative for dysuria, hematuria and difficulty urinating.       Objective:   Physical Exam  Constitutional: He is oriented to person, place, and time. He appears well-developed and well-nourished. No distress.  HENT:  Head: Normocephalic and atraumatic.  Neck: No thyromegaly present.       Normal carotid pulses bilaterally. has a mass in the right submandibular area, nontender, unchanged for years.  Cardiovascular: Normal rate and normal heart sounds.   No murmur heard. Pulmonary/Chest: Effort normal and breath sounds normal. No respiratory distress. He has no wheezes. He has no rales.  Abdominal: Soft. Bowel sounds are normal. He exhibits no distension. There is no tenderness. There is no rebound and no guarding.  Genitourinary: Rectum normal and prostate normal.  Musculoskeletal: He exhibits no edema.  Neurological: He is oriented to person, place, and time.  Skin: Skin is warm and dry. He is not diaphoretic.  Psychiatric: He has a normal mood and affect. His behavior is normal. Judgment and thought content normal.          Assessment & Plan:

## 2010-08-21 NOTE — Assessment & Plan Note (Addendum)
Td 09 pneumonia shot 2005 Shingles  shot discussed   colonoscopy 03/2006, 2 hyperplastic polyps, Next 10 years check a PSA continue healthy lifestyle, printed material provided regards  diet

## 2010-08-21 NOTE — Assessment & Plan Note (Addendum)
Mass stable, has seen ENT before.

## 2010-08-21 NOTE — Assessment & Plan Note (Signed)
Long history of thrombocytopenia, has seen hematology before in 2009 and 2010, after the evaluation they recommended to monitor CBCs from time to time. Recheck a CBC

## 2010-08-21 NOTE — Patient Instructions (Signed)
start flonase and prilosec

## 2010-08-21 NOTE — Assessment & Plan Note (Addendum)
Creatinine usually around 1.3, in February 2002 and went up to 1.8 but then went down closer to baseline. Labs

## 2010-08-21 NOTE — Assessment & Plan Note (Addendum)
Currently not well controlled, see history of present illness  Has a history of nasal polyps. Plan: d/c astepro , he uses rarely, rx flonase

## 2010-08-21 NOTE — Assessment & Plan Note (Signed)
He has ongoing issues with GERD, see previous entries. Currently on Zantac, will add Prilosec

## 2010-08-21 NOTE — Assessment & Plan Note (Signed)
Due for labs, diet and exercise encourage

## 2010-08-23 ENCOUNTER — Other Ambulatory Visit: Payer: Self-pay | Admitting: Internal Medicine

## 2010-08-23 MED ORDER — FLUTICASONE PROPIONATE 50 MCG/ACT NA SUSP: 1.0000 | Freq: Every day | NASAL | Status: DC

## 2010-08-23 NOTE — Telephone Encounter (Signed)
atient said he saw dr Drue Novel [phone] he was to call in rx for flonase to [facility] - rx isnt there

## 2010-08-31 ENCOUNTER — Other Ambulatory Visit: Payer: Self-pay | Admitting: Internal Medicine

## 2010-10-20 ENCOUNTER — Inpatient Hospital Stay (INDEPENDENT_AMBULATORY_CARE_PROVIDER_SITE_OTHER)
Admission: RE | Admit: 2010-10-20 | Discharge: 2010-10-20 | Disposition: A | Discharge status: Home / Self Care | Payer: Medicare Other | Source: Ambulatory Visit | Attending: Family Medicine | Admitting: Family Medicine

## 2010-10-20 DIAGNOSIS — L259 Unspecified contact dermatitis, unspecified cause: Secondary | ICD-10-CM

## 2010-11-06 ENCOUNTER — Other Ambulatory Visit: Payer: Self-pay | Admitting: Internal Medicine

## 2010-11-06 MED ORDER — ALPRAZOLAM 0.5 MG PO TABS: 0.5000 mg | ORAL_TABLET | Freq: Every day | ORAL | Status: DC

## 2010-11-06 NOTE — Telephone Encounter (Signed)
Ok 60, 3 RF 

## 2010-11-06 NOTE — Telephone Encounter (Signed)
Alprazolam request [last refill 05/15/10 #[id number]]

## 2010-11-06 NOTE — Telephone Encounter (Signed)
Rx Done . 

## 2010-11-30 ENCOUNTER — Telehealth: Payer: Self-pay | Admitting: *Deleted

## 2010-11-30 NOTE — Telephone Encounter (Signed)
Patient Informed

## 2010-11-30 NOTE — Telephone Encounter (Signed)
Patient is in "doughnut hole" with Insurance and his Niaspan Rx [750 mg]will cost $258. Monthly. Patient's pharmacist told him to ask his doctor if he could take OTC Niacin 500 mg [3 per day], which will only cost him $10 per bottle. Pharmacist told patient it should be Ok but he needed to check w/MD before switching [for the rest of the year] until insurance starts over. Please advise.

## 2010-11-30 NOTE — Telephone Encounter (Signed)
Ok to switch (will need LFTs on f/u)

## 2010-12-14 ENCOUNTER — Encounter: Payer: Self-pay | Admitting: Cardiology

## 2010-12-14 ENCOUNTER — Ambulatory Visit (INDEPENDENT_AMBULATORY_CARE_PROVIDER_SITE_OTHER): Payer: Medicare Other | Admitting: Cardiology

## 2010-12-14 DIAGNOSIS — E785 Hyperlipidemia, unspecified: Secondary | ICD-10-CM

## 2010-12-14 DIAGNOSIS — I2510 Atherosclerotic heart disease of native coronary artery without angina pectoris: Secondary | ICD-10-CM

## 2010-12-14 DIAGNOSIS — I10 Essential (primary) hypertension: Secondary | ICD-10-CM

## 2010-12-14 NOTE — Progress Notes (Signed)
HPI The patient returns for one year follow up.  Since I last saw him he has done well.  The patient denies any new symptoms such as chest discomfort, neck or arm discomfort. There has been no new shortness of breath, PND or orthopnea. There have been no reported palpitations, presyncope or syncope.  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  Allergies  Allergen Reactions  . Enalapril Maleate     REACTION: cough  . Telmisartan     REACTION: diarrhea    Current Outpatient Prescriptions  Medication Sig Dispense Refill  . ALPRAZolam (XANAX) 0.5 MG tablet Take 1-2 tablets (0.5-1 mg total) by mouth daily. 1-2 tablets daily single dose.  60 tablet  3  . aspirin 81 MG chewable tablet 2 daily.       Marland Kitchen co-enzyme Q-10 50 MG capsule Take 50 mg by mouth daily.        . enalapril (VASOTEC) 5 MG tablet Take 5 mg by mouth 2 (two) times daily.        . fish oil-omega-3 fatty acids 1000 MG capsule Take 2 g by mouth daily.        . Flaxseed, Linseed, (FLAX SEED OIL PO) Take by mouth.        . metoprolol (LOPRESSOR) 100 MG tablet Take 100 mg by mouth 2 (two) times daily.        . niacin 500 MG tablet Take 500 mg by mouth 3 (three) times daily with meals.        Marland Kitchen omeprazole (PRILOSEC) 20 MG capsule Take 20 mg by mouth daily.        . ranitidine (ZANTAC) 150 MG tablet Take 150 mg by mouth at bedtime.        . simvastatin (ZOCOR) 40 MG tablet TAKE 2 TABLETS BY MOUTH AT BEDTIME  180 tablet  1    Past Medical History  Diagnosis Date  . Anxiety and depression   . Hyperlipidemia   . Hypertension   . Diabetes mellitus   . GERD (gastroesophageal reflux disease)   . PUD (peptic ulcer disease) 7/11    EGD showed few small ulcers, esophagues strecthed  . Headache 06/2003    s/p neuro eval ? migraine  . CAD (coronary artery disease)   . Hyperplastic colon polyp 2008  . Iron deficiency   . Arthritis   . Ischemic cardiomyopathy     EF had 30-35%. The most recent EF was 50% on echo in september 2009    . Allergic rhinitis     Past Surgical History  Procedure Date  . Coronary artery bypass graft 2004    LIMA to the LADs, sequentail SVG to intermediated and obtuse marginal, sequential SVG to PDA and posterolaterla  . Hernia repair     inguinal    ROS: As stated in the HPI and negative for all other systems.  PHYSICAL EXAM BP 122/78  Pulse 56  Ht 5' 10.5" (1.791 m)  Wt 189 lb (85.73 kg)  BMI 26.74 kg/m2 GENERAL:  Well appearing HEENT:  Pupils equal round and reactive, fundi not visualized, oral mucosa unremarkable, dentures NECK:  No jugular venous distention, waveform within normal limits, carotid upstroke brisk and symmetric, no bruits, no thyromegaly LYMPHATICS:  No cervical, inguinal adenopathy LUNGS:  Clear to auscultation bilaterally BACK:  No CVA tenderness CHEST:  Unremarkable HEART:  PMI not displaced or sustained,S1 and S2 within normal limits, no S3, no S4, no clicks, no rubs, no murmurs ABD:  Flat, positive bowel sounds normal in frequency in pitch, no bruits, no rebound, no guarding, no midline pulsatile mass, no hepatomegaly, no splenomegaly EXT:  2 plus pulses throughout, no edema, no cyanosis no clubbing SKIN:  No rashes no nodules NEURO:  Cranial nerves II through XII grossly intact, motor grossly intact throughout PSYCH:  Cognitively intact, oriented to person place and time   ASSESSMENT AND PLAN

## 2010-12-14 NOTE — Assessment & Plan Note (Signed)
The patient has no new sypmtoms.  No further cardiovascular testing is indicated.  We will continue with aggressive risk reduction and meds as listed.  

## 2010-12-14 NOTE — Assessment & Plan Note (Addendum)
The blood pressure is at target. No change in medications is indicated. We will continue with therapeutic lifestyle changes (TLC).  

## 2010-12-14 NOTE — Patient Instructions (Signed)
The current medical regimen is effective;  continue present plan and medications.  Follow up in 1 year with Dr Hochrein.  You will receive a letter in the mail 2 months before you are due.  Please call us when you receive this letter to schedule your follow up appointment.  

## 2010-12-14 NOTE — Assessment & Plan Note (Signed)
His lipids were excellent with an LDL of 61 in Feb.

## 2010-12-18 ENCOUNTER — Other Ambulatory Visit: Payer: Self-pay | Admitting: Internal Medicine

## 2010-12-18 NOTE — Telephone Encounter (Signed)
Done

## 2011-01-09 ENCOUNTER — Ambulatory Visit (INDEPENDENT_AMBULATORY_CARE_PROVIDER_SITE_OTHER): Payer: Medicare Other

## 2011-01-09 DIAGNOSIS — Z23 Encounter for immunization: Secondary | ICD-10-CM

## 2011-01-25 ENCOUNTER — Other Ambulatory Visit: Payer: Self-pay | Admitting: Internal Medicine

## 2011-02-15 ENCOUNTER — Other Ambulatory Visit: Payer: Self-pay | Admitting: Cardiology

## 2011-02-15 ENCOUNTER — Other Ambulatory Visit: Payer: Self-pay | Admitting: Internal Medicine

## 2011-02-20 ENCOUNTER — Ambulatory Visit: Payer: Medicare Other | Admitting: Internal Medicine

## 2011-02-21 ENCOUNTER — Encounter: Payer: Self-pay | Admitting: Internal Medicine

## 2011-02-21 ENCOUNTER — Ambulatory Visit (INDEPENDENT_AMBULATORY_CARE_PROVIDER_SITE_OTHER): Payer: Medicare Other | Admitting: Internal Medicine

## 2011-02-21 VITALS — BP 100/82 | HR 42 | Temp 97.6°F | Ht 69.75 in | Wt 202.0 lb

## 2011-02-21 DIAGNOSIS — I10 Essential (primary) hypertension: Secondary | ICD-10-CM

## 2011-02-21 DIAGNOSIS — R197 Diarrhea, unspecified: Secondary | ICD-10-CM

## 2011-02-21 DIAGNOSIS — I2510 Atherosclerotic heart disease of native coronary artery without angina pectoris: Secondary | ICD-10-CM

## 2011-02-21 DIAGNOSIS — E785 Hyperlipidemia, unspecified: Secondary | ICD-10-CM

## 2011-02-21 DIAGNOSIS — E119 Type 2 diabetes mellitus without complications: Secondary | ICD-10-CM

## 2011-02-21 DIAGNOSIS — K219 Gastro-esophageal reflux disease without esophagitis: Secondary | ICD-10-CM

## 2011-02-21 NOTE — Progress Notes (Signed)
  Subjective:    Patient ID: [name] Little, male    DOB: 06-11-1940, 70 y.o.   MRN: [id number]  HPI ROV, no concerns, " I haven't feel this well in a while" CAD--note from cardiology reviewed, patient stable Diabetes-on diet control, no ambulatory CBGs GERD--under much better control since he is taking Prilosec in the morning and Zantac at night. High cholesterol--he switch from Niaspan to over-the-counter niacin, no flush, good compliance Diarrhea-- has not mention this problem before but this is going on for about a year, on average twice a week he experiences diarrhea, he reports that it is usually at night, after dinner, initially has a solid bowel movement but that is followup by watery stools. See review of systems. Symptoms not particularly related to any type of food   Past Medical History: Anxiety-depression Hyperlipidemia Hypertension Borderline DM GERD PUD------7-11, EGD showed few small ulcers, esophagus stretched Headache - s/p neuro eval ? migraine ( 06/2003) CAD   hyperplastic colon polyps 2008 IRON DEFICIENCY   ARTHRITIS   Ischemic cardiomyopathy (EF had been 30-35%.  The most recent EF  was 50% on echo in September 2009). Allergic rhinitis  Past Surgical History: Coronary artery bypass graft 2004(LIMA to the LADs,  sequential SVG to intermediate and obtuse marginal, sequential SVG  to PDA and posterolateral). Inguinal herniorrhaphy;   Family History:  DM--father  colon ca--no  prostate ca--no  MI--M and F   Social History:  Married  lives w/ wife  no children  tobacco-- quit in the 80s  ETOH-- no    Review of Systems No fever oral weight loss No abdominal pain, nausea, vomiting, no recent antibiotics. No blood in the stools. No chest pain, shortness of breath or lower extremity edema He already had a flu shot     Objective:   Physical Exam  Constitutional: He is oriented to person, place, and time. He appears well-developed and well-nourished.    HENT:  Head: Normocephalic and atraumatic.  Cardiovascular: Normal rate, regular rhythm and normal heart sounds.   No murmur heard. Pulmonary/Chest: Effort normal and breath sounds normal. No respiratory distress. He has no wheezes. He has no rales.  Abdominal: Soft. Bowel sounds are normal. He exhibits no distension. There is no tenderness. There is no rebound.  Musculoskeletal: He exhibits no edema.  Neurological: He is alert and oriented to person, place, and time.  Psychiatric: He has a normal mood and affect. His behavior is normal. Judgment and thought content normal.      Assessment & Plan:

## 2011-02-21 NOTE — Assessment & Plan Note (Signed)
Labs

## 2011-02-21 NOTE — Patient Instructions (Addendum)
Lease came back fasting: FLP AST ALT----------Dx hyperlipidemia BMP-----dx HTN A1C-----dx DM Avoid dairy products, if the diarrhea is no better in 2 months, please call  Align (probiotic) OTC  1 a day x 4 weeks

## 2011-02-21 NOTE — Assessment & Plan Note (Addendum)
Diarrhea on-off w/o red flag sx, see HPI Taking PPIs consistently x 6 months, ? related  (although apparently diarrhea going on x > 6 months ) Plan: avoid dairy products, call if no better , will need stool studies , align, see instructions

## 2011-02-21 NOTE — Assessment & Plan Note (Signed)
Well controlled w/ addition of PPIs

## 2011-02-21 NOTE — Assessment & Plan Note (Addendum)
Switched to OTC niaspan few months ago---> Labs

## 2011-02-21 NOTE — Assessment & Plan Note (Signed)
Last visit w/ cards 12-2010, stable No change

## 2011-02-21 NOTE — Assessment & Plan Note (Signed)
Due for labs  Diet exercise encouraged

## 2011-02-22 ENCOUNTER — Other Ambulatory Visit (INDEPENDENT_AMBULATORY_CARE_PROVIDER_SITE_OTHER): Payer: Medicare Other

## 2011-02-22 DIAGNOSIS — I10 Essential (primary) hypertension: Secondary | ICD-10-CM

## 2011-02-22 DIAGNOSIS — E785 Hyperlipidemia, unspecified: Secondary | ICD-10-CM

## 2011-02-22 DIAGNOSIS — E119 Type 2 diabetes mellitus without complications: Secondary | ICD-10-CM

## 2011-02-22 LAB — BASIC METABOLIC PANEL
BUN: 23 mg/dL (ref 6–23)
Chloride: 102 mEq/L (ref 96–112)
GFR: 47.18 mL/min — ABNORMAL LOW (ref >60.00)
Potassium: 4.5 mEq/L (ref 3.5–5.1)
Sodium: 140 mEq/L (ref 135–145)

## 2011-02-22 LAB — ALT: ALT: 16 U/L (ref 0–53)

## 2011-02-22 LAB — LIPID PANEL
LDL Cholesterol: 66 mg/dL (ref 0–99)
VLDL: 19.6 mg/dL (ref 0.0–40.0)

## 2011-02-22 LAB — AST: AST: 18 U/L (ref 0–37)

## 2011-02-22 LAB — HEMOGLOBIN A1C: Hgb A1c MFr Bld: 5.9 % (ref 4.6–6.5)

## 2011-02-28 ENCOUNTER — Encounter: Payer: Self-pay | Admitting: *Deleted

## 2011-03-22 ENCOUNTER — Other Ambulatory Visit: Payer: Self-pay | Admitting: Internal Medicine

## 2011-03-22 DIAGNOSIS — I10 Essential (primary) hypertension: Secondary | ICD-10-CM

## 2011-03-25 ENCOUNTER — Other Ambulatory Visit (INDEPENDENT_AMBULATORY_CARE_PROVIDER_SITE_OTHER): Payer: Medicare Other

## 2011-03-25 DIAGNOSIS — I10 Essential (primary) hypertension: Secondary | ICD-10-CM

## 2011-03-25 LAB — BASIC METABOLIC PANEL
CO2: 29 mEq/L (ref 19–32)
Chloride: 102 mEq/L (ref 96–112)
Glucose, Bld: 128 mg/dL — ABNORMAL HIGH (ref 70–99)
Sodium: 139 mEq/L (ref 135–145)

## 2011-04-09 ENCOUNTER — Other Ambulatory Visit: Payer: Self-pay | Admitting: Internal Medicine

## 2011-04-09 MED ORDER — ALPRAZOLAM 0.5 MG PO TABS: 0.5000 mg | ORAL_TABLET | Freq: Every day | ORAL | Status: DC

## 2011-04-09 NOTE — Telephone Encounter (Signed)
Refill done.  

## 2011-04-09 NOTE — Telephone Encounter (Signed)
Refill request xanax 0.5mg  #60 with 3 refills. Last refilled on 9.4.12. OK to refill?

## 2011-04-09 NOTE — Telephone Encounter (Signed)
60 and 4 RF

## 2011-04-15 ENCOUNTER — Other Ambulatory Visit: Payer: Self-pay | Admitting: Internal Medicine

## 2011-04-15 NOTE — Telephone Encounter (Signed)
Refill done.  

## 2011-04-16 ENCOUNTER — Other Ambulatory Visit: Payer: Self-pay | Admitting: Internal Medicine

## 2011-04-16 NOTE — Telephone Encounter (Signed)
We just  did #60 and 4 RF few days ago

## 2011-04-16 NOTE — Telephone Encounter (Signed)
Refill request alprazolam 0.5mg  #60 with four refills. OK to refill?

## 2011-04-17 ENCOUNTER — Other Ambulatory Visit: Payer: Self-pay | Admitting: Internal Medicine

## 2011-04-17 ENCOUNTER — Encounter: Payer: Self-pay | Admitting: *Deleted

## 2011-04-17 NOTE — Telephone Encounter (Signed)
OK to refill

## 2011-04-17 NOTE — Telephone Encounter (Signed)
Spoke with pt & he is aware that his prescription was sent in on 04/09/11.

## 2011-04-17 NOTE — Telephone Encounter (Signed)
We refilled few days ago

## 2011-04-17 NOTE — Telephone Encounter (Signed)
The pt is hoping to get a refill of the Alprazolam .5mg  tabs, qty 60 sent to the [facility] pharmacy  Last filled on 02/27/11  Thanks!

## 2011-04-17 NOTE — Telephone Encounter (Signed)
Phoned the pt & explained to him that his prescription was refilled on 2.5.13 & sent to pharmacy.

## 2011-04-22 ENCOUNTER — Other Ambulatory Visit: Payer: Self-pay | Admitting: Internal Medicine

## 2011-04-22 NOTE — Telephone Encounter (Signed)
Refill done.  

## 2011-06-04 ENCOUNTER — Ambulatory Visit (INDEPENDENT_AMBULATORY_CARE_PROVIDER_SITE_OTHER): Payer: Medicare Other | Admitting: Internal Medicine

## 2011-06-04 ENCOUNTER — Encounter: Payer: Self-pay | Admitting: Internal Medicine

## 2011-06-04 VITALS — BP 136/80 | HR 86 | Temp 97.4°F | Wt 205.0 lb

## 2011-06-04 DIAGNOSIS — N189 Chronic kidney disease, unspecified: Secondary | ICD-10-CM

## 2011-06-04 DIAGNOSIS — J309 Allergic rhinitis, unspecified: Secondary | ICD-10-CM

## 2011-06-04 DIAGNOSIS — E119 Type 2 diabetes mellitus without complications: Secondary | ICD-10-CM

## 2011-06-04 DIAGNOSIS — M129 Arthropathy, unspecified: Secondary | ICD-10-CM

## 2011-06-04 DIAGNOSIS — I10 Essential (primary) hypertension: Secondary | ICD-10-CM

## 2011-06-04 LAB — BASIC METABOLIC PANEL
BUN: 23 mg/dL (ref 6–23)
CO2: 29 mEq/L (ref 19–32)
Chloride: 102 mEq/L (ref 96–112)
Creatinine, Ser: 1.7 mg/dL — ABNORMAL HIGH (ref 0.4–1.5)
Glucose, Bld: 107 mg/dL — ABNORMAL HIGH (ref 70–99)
Potassium: 4.9 mEq/L (ref 3.5–5.1)

## 2011-06-04 LAB — CBC WITH DIFFERENTIAL/PLATELET
Eosinophils Absolute: 1.4 10*3/uL — ABNORMAL HIGH (ref 0.0–0.7)
Eosinophils Relative: 18.3 % — ABNORMAL HIGH (ref 0.0–5.0)
HCT: 43.3 % (ref 39.0–52.0)
Lymphs Abs: 1.7 10*3/uL (ref 0.7–4.0)
MCHC: 32.9 g/dL (ref 30.0–36.0)
MCV: 90.1 fl (ref 78.0–100.0)
Monocytes Absolute: 0.7 10*3/uL (ref 0.1–1.0)
Neutrophils Relative %: 49.3 % (ref 43.0–77.0)
Platelets: 134 10*3/uL — ABNORMAL LOW (ref 150.0–400.0)
RDW: 14.2 % (ref 11.5–14.6)

## 2011-06-04 MED ORDER — AZELASTINE HCL 0.1 % NA SOLN: 2.0000 | Freq: Every day | NASAL | Status: DC

## 2011-06-04 NOTE — Assessment & Plan Note (Signed)
Will add a nasal spray to his regimen, he is complaining of nasal congestion in the afternoon

## 2011-06-04 NOTE — Assessment & Plan Note (Signed)
Seems well controlled. No change.

## 2011-06-04 NOTE — Assessment & Plan Note (Signed)
Stable per last A1c. On diet only.

## 2011-06-04 NOTE — Assessment & Plan Note (Signed)
We'll recheck his creatinine today, creatinine levels fluctuate  over time. We will also check a CBC. Last renal ultrasound 04/2010 showed some mild cortical thinning bilaterally

## 2011-06-04 NOTE — Assessment & Plan Note (Signed)
Complains of back pain without radiculopathy. He is not taking anti-inflammatories, and he needs to continue avoiding them d/t renal issues Recommend Tylenol and local heat.

## 2011-06-04 NOTE — Progress Notes (Signed)
  Subjective:    Patient ID: [name] Little, male    DOB: August 21, 1940, 71 y.o.   MRN: [id number]  HPI Routine office visit Complaint of back pain, going on for a while, located on the mid-lower back without radiation. Chronic renal insufficiency, we are monitoring his renal function, needs to be rechecked today. Denies the use of Motrin or any motrin-like meds  in the last few months  Past Medical History:  Anxiety-depression  Hyperlipidemia  Hypertension  Borderline DM  CAD, CABG 2004 Ischemic cardiomyopathy (EF had been 30-35%. The most recent EF was 50% on echo in September 2009).  GERD  PUD------7-11, EGD showed few small ulcers, esophagus stretched  Headache - s/p neuro eval ? migraine ( 06/2003)  hyperplastic colon polyps 2008  IRON DEFICIENCY  ARTHRITIS  Allergic rhinitis Past Surgical History:  Coronary artery bypass graft 2004 (LIMA to the LADs, sequential SVG to intermediate and obtuse marginal, sequential SVG to PDA and posterolateral).  Inguinal herniorrhaphy;     Review of Systems Was seen recently for diarrhea, that has resolved. Denies any bladder or bowel incontinence, no lower extremity paresthesias. Complains of nasal congestion, mostly in afternoons.    Objective:   Physical Exam Alert oriented x3. Musculoskeletal, nontender to palpation of the lower back Neurological exam, gait and posterior normal, DTRs symmetric in the lower extremities. Extremities with trace edema bilaterally and symmetrically.        Assessment & Plan:

## 2011-06-11 ENCOUNTER — Other Ambulatory Visit: Payer: Self-pay | Admitting: Internal Medicine

## 2011-06-12 NOTE — Telephone Encounter (Signed)
Refill done.  

## 2011-06-14 ENCOUNTER — Telehealth: Payer: Self-pay | Admitting: Internal Medicine

## 2011-06-14 NOTE — Telephone Encounter (Signed)
Discussed with pt

## 2011-06-14 NOTE — Telephone Encounter (Signed)
Please call cell with lab results Patient ph# [phone]

## 2011-06-14 NOTE — Telephone Encounter (Signed)
Please call with lab results  Patient cell [phone]

## 2011-07-22 ENCOUNTER — Other Ambulatory Visit: Payer: Self-pay | Admitting: Internal Medicine

## 2011-07-22 NOTE — Telephone Encounter (Signed)
Refill done.  

## 2011-08-05 ENCOUNTER — Ambulatory Visit: Payer: Medicare Other | Admitting: Internal Medicine

## 2011-08-06 ENCOUNTER — Encounter: Payer: Self-pay | Admitting: Internal Medicine

## 2011-08-06 ENCOUNTER — Ambulatory Visit (INDEPENDENT_AMBULATORY_CARE_PROVIDER_SITE_OTHER): Payer: Medicare Other | Admitting: Internal Medicine

## 2011-08-06 ENCOUNTER — Ambulatory Visit (INDEPENDENT_AMBULATORY_CARE_PROVIDER_SITE_OTHER)
Admission: RE | Admit: 2011-08-06 | Discharge: 2011-08-06 | Disposition: A | Discharge status: Home / Self Care | Payer: Medicare Other | Source: Ambulatory Visit | Attending: Internal Medicine | Admitting: Internal Medicine

## 2011-08-06 VITALS — BP 140/78 | HR 62 | Temp 97.6°F | Wt 207.0 lb

## 2011-08-06 DIAGNOSIS — M129 Arthropathy, unspecified: Secondary | ICD-10-CM

## 2011-08-06 DIAGNOSIS — J309 Allergic rhinitis, unspecified: Secondary | ICD-10-CM

## 2011-08-06 MED ORDER — HYDROCODONE-ACETAMINOPHEN 5-500 MG PO TABS: 1.0000 | ORAL_TABLET | Freq: Four times a day (QID) | ORAL | Status: AC | PRN

## 2011-08-06 NOTE — Progress Notes (Signed)
  Subjective:    Patient ID: [name] Little, male    DOB: Jul 14, 1940, 71 y.o.   MRN: [id number]  HPI Routine visit. Her main concern today is back pain, no radiation, located at the bilateral low back, worse when he stands. Also his allergies and not well controlled, complains of a stuffy nose, occasional cough, runny eyes.  Past Medical History:   Anxiety-depression   Hyperlipidemia   Hypertension   Borderline DM   CAD, CABG 2004 Ischemic cardiomyopathy (EF had been 30-35%. The most recent EF was 50% on echo in September 2009).   GERD   PUD------7-11, EGD showed few small ulcers, esophagus stretched   Headache - s/p neuro eval ? migraine ( 06/2003)   hyperplastic colon polyps 2008   IRON DEFICIENCY   ARTHRITIS   Allergic rhinitis  Past Surgical History:   Coronary artery bypass graft 2004 (LIMA to the LADs, sequential SVG to intermediate and obtuse marginal, sequential SVG to PDA and posterolateral).   Inguinal herniorrhaphy;   Review of Systems No fever or chills No chest pain, shortness of breath or lower extremity edema. Denies any bladder or bowel incontinence.     Objective:   Physical Exam General -- alert, well-developed, no apparent distress.  HEENT --  Nose slightly congested Lungs -- normal respiratory effort, no intercostal retractions, no accessory muscle use, and normal breath sounds.   Heart-- normal rate, regular rhythm, no murmur, and no gallop.   Extremities-- no pretibial edema bilaterally  Neurologic-- alert & oriented X3 , gait, posture normal. Strength and DTRs symmetric. Straight leg test negative. Psych-- Cognition and judgment appear intact. Alert and cooperative with normal attention span and concentration.  not anxious appearing and not depressed appearing.      Assessment & Plan:

## 2011-08-06 NOTE — Patient Instructions (Signed)
Please get your x-ray at the other   office located at: [address] [place], across from [facility].  Please go to the basement, this is a walk-in facility, they are open from 8:30 to 5:30 PM. Phone number [phone]. ---- If the pain is not severe, use Tylenol. If the pain is intense use Vicodin. Will make you sleepy. Be careful. ----- Take the nose spray twice a dayevery day. Use Claritin 10 mg daily until you feel better ---- Next visit in one month 2 months, for a yearly exam

## 2011-08-06 NOTE — Assessment & Plan Note (Signed)
Continue with back pain, we discussed referral to orthopedic surgery, x-rays, physical therapy. Patient is somewhat reluctant to any intervention, eventually we agreed to do a x-ray. He also agrees to try hydrocodone if needed because of pain at times is not well controlled w/ tylenol

## 2011-08-06 NOTE — Assessment & Plan Note (Signed)
Symptoms not well-controlled, currently using Astelin once a day, recommend to increase it to twice a day and use Claritin temporarily.

## 2011-09-11 ENCOUNTER — Ambulatory Visit (INDEPENDENT_AMBULATORY_CARE_PROVIDER_SITE_OTHER): Payer: Medicare Other | Admitting: Internal Medicine

## 2011-09-11 ENCOUNTER — Encounter: Payer: Self-pay | Admitting: Internal Medicine

## 2011-09-11 VITALS — BP 138/82 | HR 42 | Temp 97.7°F | Ht 69.0 in | Wt 206.0 lb

## 2011-09-11 DIAGNOSIS — N189 Chronic kidney disease, unspecified: Secondary | ICD-10-CM

## 2011-09-11 DIAGNOSIS — E119 Type 2 diabetes mellitus without complications: Secondary | ICD-10-CM

## 2011-09-11 DIAGNOSIS — I2510 Atherosclerotic heart disease of native coronary artery without angina pectoris: Secondary | ICD-10-CM

## 2011-09-11 DIAGNOSIS — F32A Depression, unspecified: Secondary | ICD-10-CM

## 2011-09-11 DIAGNOSIS — Z0000 Encounter for general adult medical examination without abnormal findings: Secondary | ICD-10-CM

## 2011-09-11 DIAGNOSIS — Z23 Encounter for immunization: Secondary | ICD-10-CM

## 2011-09-11 DIAGNOSIS — M1990 Unspecified osteoarthritis, unspecified site: Secondary | ICD-10-CM

## 2011-09-11 DIAGNOSIS — F341 Dysthymic disorder: Secondary | ICD-10-CM

## 2011-09-11 DIAGNOSIS — K219 Gastro-esophageal reflux disease without esophagitis: Secondary | ICD-10-CM

## 2011-09-11 DIAGNOSIS — F419 Anxiety disorder, unspecified: Secondary | ICD-10-CM

## 2011-09-11 LAB — BASIC METABOLIC PANEL
CO2: 29 mEq/L (ref 19–32)
Chloride: 103 mEq/L (ref 96–112)
Creatinine, Ser: 1.5 mg/dL (ref 0.4–1.5)
Potassium: 4.7 mEq/L (ref 3.5–5.1)

## 2011-09-11 LAB — TSH: TSH: 0.8 u[IU]/mL (ref 0.35–5.50)

## 2011-09-11 MED ORDER — ZOSTER VACCINE LIVE 19400 UNT/0.65ML ~~LOC~~ SOLR: 0.6500 mL | Freq: Once | SUBCUTANEOUS | Status: AC

## 2011-09-11 NOTE — Progress Notes (Signed)
  Subjective:    Patient ID: [name] Little, male    DOB: [birth year], 71 y.o.   MRN: [id number]  HPI Here for Medicare AWV: 1. Risk factors based on Past M, S, F history: reviewed 2. Physical Activities: less active than last year d/t DJD, still does some yard work, home chores 3. Depression/mood:  Sx well controlled on xanax   4. Hearing:  No problems w/ normal conversation; has B tinnitus, at some point was seen by audiologist, was offered an aid. 5. ADL's:  Independent , still drives  6. Fall Risk: had a fall no long ago, tripped and fell, occ feels off balance, no dizziness--->precautions discussed , cane? Offered a PT referral (declined) 7. Home Safety: does feel safe at home   8. Height, weight, &visual acuity: see VS, sees eye doctor yearly   9. Counseling: provided 10. Labs ordered based on risk factors: if needed   11. Referral Coordination: if needed 12.  Care Plan, see assessment and plan   13.   Cognitive Assessment: Motor skills and cognition appropriate for age  In addition, today we discussed the following: Hyperlipidemia, good compliance with simvastatin . Besides aches and pains from DJD he denies myalgias. DJD, continue with occasional back pain, Aleve helps. Allergies, good compliance with Astelin, it helps to some extent. GERD, takes omeprazole and Zantac, was still having occasional heartburn but recently started to take medications before meals and is doing better. Denies odynophagia or dysphagia  Past Medical History:   Anxiety-depression   Hyperlipidemia   Hypertension   Borderline DM   CAD, CABG 2004 Ischemic cardiomyopathy (EF had been 30-35%. The most recent EF was 50% on echo in September 2009) DJD   GERD   PUD------7-11, EGD showed few small ulcers, esophagus stretched   Headache - s/p neuro eval ? migraine ( 06/2003)   H/o IRON DEFICIENCY   Allergic rhinitis  Past Surgical History:   Coronary artery bypass graft 2004 (LIMA to the LADs, sequential  SVG to intermediate and obtuse marginal, sequential SVG to PDA and posterolateral).   Inguinal herniorrhaphy;   Family History:   DM--father   colon ca--no   prostate ca--no   MI--M and F   Social History:   Married , lives w/ wife , no children   tobacco-- quit in the 25s   ETOH-- no  Diet-- regular, room for improvment  Review of Systems No chest pain or shortness or breath No nausea, vomiting, diarrhea or blood in the stools No dysuria or gross hematuria. Very rarely has cough, twice a week, no wheezing. No headaches.     Objective:   Physical Exam General -- alert, well-developed Neck --no thyromegaly , normal carotid pulse Lungs -- normal respiratory effort, no intercostal retractions, no accessory muscle use, and normal breath sounds.   Heart-- normal rate, regular rhythm, no murmur, and no gallop.   Abdomen--soft, non-tender, no distention, no masses, no HSM, no guarding, and no rigidity. No bruit  Extremities-- no pretibial edema bilaterally Neurologic-- alert & oriented X3 and strength normal in all extremities. Psych-- Cognition and judgment appear intact. Alert and cooperative with normal attention span and concentration.  not anxious appearing and not depressed appearing.      Assessment & Plan:

## 2011-09-11 NOTE — Assessment & Plan Note (Signed)
See history of present illness. Symptoms improved by taking PPIs before meals. Denies dysphasia or odynophagia.

## 2011-09-11 NOTE — Assessment & Plan Note (Signed)
Currently requiring only Xanax as needed

## 2011-09-11 NOTE — Assessment & Plan Note (Signed)
Mild DJD in the back for x-rays. Aleve brings some relief. I recommend him to stop Aleve due to history of PUD. Plan: Tylenol as needed, will call if needs hydrocodone

## 2011-09-11 NOTE — Patient Instructions (Addendum)
Avoid Motrin, Advil, Aleve. If you have back pain, take only Tylenol. If you need stronger pain medication, let me know. Please read the information about healthy diet. Come back in 6 months, fasting, for a checkup

## 2011-09-11 NOTE — Assessment & Plan Note (Signed)
Td 09 pneumonia shot 2005 and 09-2011 Shingles  Shot-- patient is interested, prescription provided colonoscopy 03/2006, 2 hyperplastic polyps, Next 10 years PSAs have been consistently normal, DRE last year normal. Re- asses the need for prostate cancer screening next year  Exercise limited by DJD. Diet discussed.

## 2011-09-11 NOTE — Assessment & Plan Note (Signed)
Creatinine varies over time. Check a BMP. Avoid NSAIDs

## 2011-09-11 NOTE — Assessment & Plan Note (Signed)
On diet control only, check A1c 

## 2011-09-11 NOTE — Assessment & Plan Note (Signed)
Asymptomatic, continue with ACE inhibitors, simvastatin, aspirin and beta blockers  

## 2011-09-26 ENCOUNTER — Telehealth: Payer: Self-pay | Admitting: *Deleted

## 2011-09-26 NOTE — Telephone Encounter (Signed)
Pt left VM stating that he had some questions about his labs done on 09-11-11. Left message to call office.

## 2011-10-04 NOTE — Telephone Encounter (Signed)
Left message to call office

## 2011-10-04 NOTE — Telephone Encounter (Signed)
Pt states that he usually gets labs in mail and had not heard anything so that why he called. Advise Pt the labs released to mychart so that is why labs not mailed. Pt indicated that he does not remember how to access this info. Pt given info as well as number to help desk to help him with access to my chart.   Reviewed lab results with Pt: Your blood sugars continue to be well-controlled, the A1c is 6.3.Your kidney function is stable. Your thyroid test is normal. Good results.

## 2011-10-04 NOTE — Telephone Encounter (Signed)
Pt returned your call.  

## 2011-10-21 ENCOUNTER — Other Ambulatory Visit: Payer: Self-pay | Admitting: Internal Medicine

## 2011-10-21 MED ORDER — ALPRAZOLAM 0.5 MG PO TABS: 0.5000 mg | ORAL_TABLET | Freq: Every day | ORAL | Status: DC

## 2011-10-21 NOTE — Telephone Encounter (Signed)
Refill x1 

## 2011-10-21 NOTE — Telephone Encounter (Signed)
ALPRAZOLAM 0.5MG  TABLET QTY: 60 LAST REFILL: 09/13/11 TAEK 1 TO 2 TABLETS BY MOUTH DAILY AS A SINGLE DOSE

## 2011-10-21 NOTE — Telephone Encounter (Signed)
Refill done.  

## 2011-10-21 NOTE — Telephone Encounter (Signed)
Ok to refill 

## 2011-11-11 ENCOUNTER — Other Ambulatory Visit: Payer: Self-pay | Admitting: Cardiology

## 2011-11-27 ENCOUNTER — Ambulatory Visit (INDEPENDENT_AMBULATORY_CARE_PROVIDER_SITE_OTHER): Payer: Medicare Other

## 2011-11-27 DIAGNOSIS — Z23 Encounter for immunization: Secondary | ICD-10-CM

## 2011-12-17 ENCOUNTER — Ambulatory Visit (INDEPENDENT_AMBULATORY_CARE_PROVIDER_SITE_OTHER): Payer: Medicare Other | Admitting: Cardiology

## 2011-12-17 ENCOUNTER — Encounter: Payer: Self-pay | Admitting: Cardiology

## 2011-12-17 VITALS — BP 148/73 | HR 54 | Ht 70.0 in | Wt 206.4 lb

## 2011-12-17 DIAGNOSIS — I2510 Atherosclerotic heart disease of native coronary artery without angina pectoris: Secondary | ICD-10-CM

## 2011-12-17 DIAGNOSIS — E785 Hyperlipidemia, unspecified: Secondary | ICD-10-CM

## 2011-12-17 DIAGNOSIS — I10 Essential (primary) hypertension: Secondary | ICD-10-CM

## 2011-12-17 DIAGNOSIS — E119 Type 2 diabetes mellitus without complications: Secondary | ICD-10-CM

## 2011-12-17 NOTE — Progress Notes (Signed)
HPI The patient returns for one year follow up.  Since I last saw him he has done well except arthritis.for significant.  this limits him from doing more than mild yard work. With this he denies any new symptoms. He has none of the symptoms that was his previous angina.  The patient denies any new symptoms such as chest discomfort, neck or arm discomfort. There has been no new shortness of breath, PND or orthopnea. There have been no reported palpitations, presyncope or syncope.    Allergies  Allergen Reactions  . Telmisartan     REACTION: diarrhea    Current Outpatient Prescriptions  Medication Sig Dispense Refill  . ALPRAZolam (XANAX) 0.5 MG tablet Take 1-2 tablets (0.5-1 mg total) by mouth daily. 1-2 tablets daily single dose.  60 tablet  0  . aspirin 81 MG chewable tablet 2 daily.       Marland Kitchen co-enzyme Q-10 50 MG capsule Take 300 mg by mouth daily.       . enalapril (VASOTEC) 5 MG tablet TAKE 1 TABLET BY MOUTH TWICE DAILY AS DIRECTED  60 tablet  1  . fish oil-omega-3 fatty acids 1000 MG capsule Take 2 g by mouth daily.        . Flaxseed, Linseed, (FLAX SEED OIL PO) Take by mouth.       . metoprolol (LOPRESSOR) 100 MG tablet TAKE 1 TABLET BY MOUTH TWICE DAILY  60 tablet  6  . niacin 500 MG tablet Take 1,500 mg by mouth at bedtime.       . ranitidine (ZANTAC) 150 MG tablet Take 150 mg by mouth at bedtime.        . simvastatin (ZOCOR) 40 MG tablet TAKE 2 TABLETS BY MOUTH DAILY AT BEDTIME  180 tablet  2    Past Medical History  Diagnosis Date  . Anxiety and depression   . Hyperlipidemia   . Hypertension   . Diabetes mellitus   . GERD (gastroesophageal reflux disease)   . PUD (peptic ulcer disease) 7/11    EGD showed few small ulcers, esophagues strecthed  . Headache 06/2003    s/p neuro eval ? migraine  . CAD (coronary artery disease)   . Hyperplastic colon polyp 2008  . Iron deficiency   . Arthritis   . Ischemic cardiomyopathy     EF had 30-35%. The most recent EF was 50% on  echo in september 2009  . Allergic rhinitis     Past Surgical History  Procedure Date  . Coronary artery bypass graft 2004    LIMA to the LADs, sequentail SVG to intermediated and obtuse marginal, sequential SVG to PDA and posterolaterla  . Hernia repair     inguinal    ROS: As stated in the HPI and negative for all other systems.  PHYSICAL EXAM BP 148/73  Pulse 54  Ht 5\' 10"  (1.778 m)  Wt 206 lb 6.4 oz (93.622 kg)  BMI 29.62 kg/m2 GENERAL:  Well appearing HEENT:  Pupils equal round and reactive, fundi not visualized, oral mucosa unremarkable, dentures NECK:  No jugular venous distention, waveform within normal limits, carotid upstroke brisk and symmetric, no bruits, no thyromegaly LYMPHATICS:  No cervical, inguinal adenopathy LUNGS:  Clear to auscultation bilaterally BACK:  No CVA tenderness CHEST:  Unremarkable HEART:  PMI not displaced or sustained,S1 and S2 within normal limits, no S3, no S4, no clicks, no rubs, no murmurs ABD:  Flat, positive bowel sounds normal in frequency in pitch, no bruits, no  rebound, no guarding, no midline pulsatile mass, no hepatomegaly, no splenomegaly EXT:  2 plus pulses throughout, no edema, no cyanosis no clubbing SKIN:  No rashes no nodules NEURO:  Cranial nerves II through XII grossly intact, motor grossly intact throughout PSYCH:  Cognitively intact, oriented to person place and time  EKG:  Sinus bradycardia, rate 50 or, old inferior infarct, possible old anteroseptal infarct, nonspecific lateral ST depression. 12/17/2011  ASSESSMENT AND PLAN  CAD -  Given the number of years since the bypass stress testing is indicated.  He would not be able to walk a treadmill. Therefore, he will have a [employer].   HYPERTENSION -  The blood pressure is at target on previous visits though slightly elevated today.Marland Kitchen No change in medications is indicated. We will continue with therapeutic lifestyle changes (TLC).    HYPERLIPIDEMIA -  His LDL  was 66 with an HDL of 34.5 last December. He should have this repeated at his upcoming physical in January with a goal LDL less than 70.

## 2011-12-17 NOTE — Patient Instructions (Addendum)
The current medical regimen is effective;  continue present plan and medications.  Your physician has requested that you have a lexiscan myoview. For further information please visit [email or web address]. Please follow instruction sheet, as given.  Follow up in 1 year with Dr Hochrein.  You will receive a letter in the mail 2 months before you are due.  Please call us when you receive this letter to schedule your follow up appointment.  

## 2011-12-24 ENCOUNTER — Telehealth: Payer: Self-pay | Admitting: Internal Medicine

## 2011-12-24 NOTE — Telephone Encounter (Signed)
Patient called stating he got a bill from his insurance for his copay because he was treated for diabetes during his wellness visit. He states he did not know he had diabetes and would like to know when this happened. Please call home or mobile #.

## 2011-12-24 NOTE — Telephone Encounter (Signed)
lmovm for pt to return call.  

## 2011-12-24 NOTE — Telephone Encounter (Signed)
Can you please verify when pt was diagnosed with diabetes?

## 2011-12-24 NOTE — Telephone Encounter (Signed)
Patient has borderline diabetes with the A1c of 6.4. During his physical we also talked about (or the chart was reviewed in ref to) : GERD, DJD, renal insufficiency, CAD, anxiety, depression.

## 2011-12-25 ENCOUNTER — Ambulatory Visit (HOSPITAL_COMMUNITY): Payer: Medicare Other | Attending: Cardiovascular Disease | Admitting: Radiology

## 2011-12-25 VITALS — BP 137/82 | Ht 70.0 in | Wt 203.0 lb

## 2011-12-25 DIAGNOSIS — E785 Hyperlipidemia, unspecified: Secondary | ICD-10-CM | POA: Insufficient documentation

## 2011-12-25 DIAGNOSIS — Z951 Presence of aortocoronary bypass graft: Secondary | ICD-10-CM | POA: Insufficient documentation

## 2011-12-25 DIAGNOSIS — I2510 Atherosclerotic heart disease of native coronary artery without angina pectoris: Secondary | ICD-10-CM

## 2011-12-25 DIAGNOSIS — R0609 Other forms of dyspnea: Secondary | ICD-10-CM | POA: Insufficient documentation

## 2011-12-25 DIAGNOSIS — R0989 Other specified symptoms and signs involving the circulatory and respiratory systems: Secondary | ICD-10-CM | POA: Insufficient documentation

## 2011-12-25 DIAGNOSIS — R5381 Other malaise: Secondary | ICD-10-CM | POA: Insufficient documentation

## 2011-12-25 DIAGNOSIS — R5383 Other fatigue: Secondary | ICD-10-CM | POA: Insufficient documentation

## 2011-12-25 DIAGNOSIS — R0602 Shortness of breath: Secondary | ICD-10-CM

## 2011-12-25 DIAGNOSIS — I10 Essential (primary) hypertension: Secondary | ICD-10-CM | POA: Insufficient documentation

## 2011-12-25 DIAGNOSIS — Z87891 Personal history of nicotine dependence: Secondary | ICD-10-CM | POA: Insufficient documentation

## 2011-12-25 DIAGNOSIS — R002 Palpitations: Secondary | ICD-10-CM | POA: Insufficient documentation

## 2011-12-25 DIAGNOSIS — Z8249 Family history of ischemic heart disease and other diseases of the circulatory system: Secondary | ICD-10-CM | POA: Insufficient documentation

## 2011-12-25 MED ORDER — REGADENOSON 0.4 MG/5ML IV SOLN
0.4000 mg | Freq: Once | INTRAVENOUS | Status: AC
  Administered 2011-12-25: 0.4 mg via INTRAVENOUS

## 2011-12-25 MED ORDER — AMINOPHYLLINE 25 MG/ML IV SOLN
75.0000 mg | Freq: Once | INTRAVENOUS | Status: AC
  Administered 2011-12-25: 75 mg via INTRAVENOUS

## 2011-12-25 MED ORDER — TECHNETIUM TC 99M SESTAMIBI GENERIC - CARDIOLITE
30.0000 | Freq: Once | INTRAVENOUS | Status: AC | PRN
  Administered 2011-12-25: 30 via INTRAVENOUS

## 2011-12-25 MED ORDER — TECHNETIUM TC 99M SESTAMIBI GENERIC - CARDIOLITE
10.0000 | Freq: Once | INTRAVENOUS | Status: AC | PRN
  Administered 2011-12-25: 10 via INTRAVENOUS

## 2011-12-25 NOTE — Telephone Encounter (Signed)
Disregard documentation below.

## 2011-12-25 NOTE — Telephone Encounter (Signed)
i have contacted Dr. Rogue Jury office & they are faxing over another surgical clearance form.

## 2011-12-25 NOTE — Progress Notes (Signed)
[facility] SITE 3 NUCLEAR MED [id number] [place] [address] [phone]  Cardiology Nuclear Med Study  [name] Little is a 71 y.o. male     MRN : [id number]     DOB: [birth year]  Procedure Date: 12/25/2011  Nuclear Med Background Indication for Stress Test:  Evaluation for Ischemia and Graft Patency History:  '04 MPS: EF: 27% multivessel anteroapical inferolateral defects CAD, Heart Cath-CABG x3 , '09 ECHO: EF: 45-50%, ICM   Cardiac Risk Factors: Family History - CAD, History of Smoking, Hypertension and Lipids  Symptoms:  DOE, Fatigue, Palpitations and SOB   Nuclear Pre-Procedure Caffeine/Decaff Intake:  None NPO After: 7:30pm   Lungs:  clear O2 Sat: 98% on room air. IV 0.9% NS with Angio Cath:  20g  IV Site: R Hand  IV Started by:  Bonnita Levan, RN  Chest Size (in):  46 Cup Size: n/a  Height: 5\' 10"  (1.778 m)  Weight:  203 lb (92.08 kg)  BMI:  Body mass index is 29.13 kg/(m^2). Tech Comments:  This patient was reversed with Aminophylline for multiple symptoms. He was given 125 mg IV and all symptoms were resolved.    Nuclear Med Study 1 or 2 day study: 1 day  Stress Test Type:  Eugenie Birks  Reading MD: Charlton Haws, MD  Order Authorizing Provider:  Rollene Rotunda, MD  Resting Radionuclide: Technetium 45m Sestamibi  Resting Radionuclide Dose: 11.0 mCi   Stress Radionuclide:  Technetium 73m Sestamibi  Stress Radionuclide Dose: 33.0 mCi           Stress Protocol Rest HR: 52 Stress HR: 71  Rest BP: 137/82 Stress BP: 149/81  Exercise Time (min): n/a METS: n/a   Predicted Max HR: 148 bpm % Max HR: 47.97 bpm Rate Pressure Product: 28413   Dose of Adenosine (mg):  n/a Dose of Lexiscan: 0.4 mg Dose of Aminophylline: 125.0 mg  Dose of Atropine (mg): n/a Dose of Dobutamine: n/a mcg/kg/min (at max HR)  Stress Test Technologist: Milana Na, EMT-P  Nuclear Technologist:  Domenic Polite, CNMT     Rest Procedure:  Myocardial  perfusion imaging was performed at rest 45 minutes following the intravenous administration of Technetium 62m Sestamibi. Rest ECG: Sinus Huston Foley with pvcs  Stress Procedure:  The patient received IV Lexiscan 0.4 mg over 15-seconds.  Technetium 27m Sestamibi injected at 30-seconds.  There were no significant changes,sob, weakness, fatigue, (L) side pain, and occ pvcs with Lexiscan.  Quantitative spect images were obtained after a 45 minute delay. Stress ECG: No significant change from baseline ECG  QPS Raw Data Images:  Normal; no motion artifact; normal heart/lung ratio. Stress Images:  Decreased uptake in both anterior and inferior walls Rest Images:  Decreased uptake in both anterior and inferior walls Subtraction (SDS):  SDS 6 Transient Ischemic Dilatation (Normal <1.22):  1.08 Lung/Heart Ratio (Normal <0.45):  0.38  Quantitative Gated Spect Images QGS EDV:  NA QGS ESV:  NA  Impression Exercise Capacity:  Lexiscan with no exercise. BP Response:  Normal blood pressure response. Clinical Symptoms:  No significant symptoms noted. ECG Impression:  No significant ST segment change suggestive of ischemia. 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  Distal anteriior and apical wall infarct No ischemia  Findings consistent with multivessel CAD  LV Ejection Fraction: NA.  LV Wall Motion:  NA   Charlton Haws

## 2011-12-26 ENCOUNTER — Telehealth: Payer: Self-pay | Admitting: Internal Medicine

## 2011-12-26 NOTE — Telephone Encounter (Signed)
Ok 60 and 4 RF 

## 2011-12-26 NOTE — Telephone Encounter (Signed)
Ok to refill 

## 2011-12-26 NOTE — Telephone Encounter (Signed)
Refill: Alprazolam 0.5mg  tablet. Take 1 to 2 tablets by mouth once daily. Qty 60. Last fill 10-21-11

## 2011-12-27 ENCOUNTER — Telehealth: Payer: Self-pay | Admitting: Cardiology

## 2011-12-27 MED ORDER — ALPRAZOLAM 0.5 MG PO TABS: 0.5000 mg | ORAL_TABLET | Freq: Every day | ORAL | Status: DC

## 2011-12-27 NOTE — Telephone Encounter (Signed)
Reviewed results of echo with pt who states understanding. 

## 2011-12-27 NOTE — Telephone Encounter (Signed)
New Problem:    Patient called in because when using MyChart to retrieve the results of his stress test, after receiving a notification that his results were ready, he received a message that the component was unavailable.  Please call back.

## 2011-12-27 NOTE — Telephone Encounter (Signed)
Refill done.  

## 2012-01-10 ENCOUNTER — Other Ambulatory Visit: Payer: Self-pay | Admitting: Cardiology

## 2012-01-10 ENCOUNTER — Other Ambulatory Visit: Payer: Self-pay | Admitting: Internal Medicine

## 2012-01-10 NOTE — Telephone Encounter (Signed)
Refill done.  

## 2012-02-05 ENCOUNTER — Ambulatory Visit (INDEPENDENT_AMBULATORY_CARE_PROVIDER_SITE_OTHER): Payer: Medicare Other | Admitting: Internal Medicine

## 2012-02-05 ENCOUNTER — Encounter: Payer: Self-pay | Admitting: Internal Medicine

## 2012-02-05 VITALS — BP 130/80 | HR 70 | Temp 97.9°F | Wt 206.0 lb

## 2012-02-05 DIAGNOSIS — J329 Chronic sinusitis, unspecified: Secondary | ICD-10-CM | POA: Insufficient documentation

## 2012-02-05 DIAGNOSIS — J069 Acute upper respiratory infection, unspecified: Secondary | ICD-10-CM

## 2012-02-05 MED ORDER — AMOXICILLIN 500 MG PO CAPS: 1000.0000 mg | ORAL_CAPSULE | Freq: Two times a day (BID) | ORAL | Status: AC

## 2012-02-05 MED ORDER — AZELASTINE HCL 0.1 % NA SOLN: 2.0000 | Freq: Every day | NASAL | Status: DC

## 2012-02-05 NOTE — Assessment & Plan Note (Signed)
Symptoms consistent with a URI, see instructions

## 2012-02-05 NOTE — Patient Instructions (Addendum)
Rest, fluids , tylenol If  cough, take Mucinex DM twice a day as needed  For congestion use astelin nasal spray once a day until you feel better Take the antibiotic as prescribed  (Amoxicillin) only if not improving in hte next few days  Call if no better in few days Call anytime if the symptoms are severe

## 2012-02-05 NOTE — Progress Notes (Signed)
  Subjective:    Patient ID: [name] Little, male    DOB: 24-Mar-1940, 71 y.o.   MRN: [id number]  HPI Acute visit Symptoms started one week ago:  hoarseness, cough, some runny nose, feeling warm but no actual fever or chills, very fatigue.  Past Medical History:   Anxiety-depression   Hyperlipidemia   Hypertension   Borderline DM   CAD, CABG 2004 Ischemic cardiomyopathy (EF had been 30-35%. The most recent EF was 50% on echo in September 2009) DJD   GERD   PUD------7-11, EGD showed few small ulcers, esophagus stretched   Headache - s/p neuro eval ? migraine ( 06/2003)   H/o IRON DEFICIENCY   Allergic rhinitis  Past Surgical History:   Coronary artery bypass graft 2004 (LIMA to the LADs, sequential SVG to intermediate and obtuse marginal, sequential SVG to PDA and posterolateral).   Inguinal herniorrhaphy;   Family History:   DM--father   colon ca--no   prostate ca--no   MI--M and F   Social History:   Married , lives w/ wife , no children   tobacco-- quit in the 8s   ETOH-- no  Diet-- regular, room for improvment   Review of Systems Not able to blow anything out of his sinuses, today for the first time produced a small amount of sputum. He does have some facial congestion but no chest congestion. Mild sore throat. No nausea, vomiting, diarrhea or myalgias.    Objective:   Physical Exam  General -- alert, well-developed, , VSS, no apparent distress.  HEENT -- TMs normal, throat w/o redness, face symmetric and not tender to palpation, nose slt congested   Lungs -- normal respiratory effort, no intercostal retractions, no accessory muscle use, and few rhonchi, otherwise clear  Heart-- normal rate, regular rhythm, no murmur, and no gallop.    Psych-- Cognition and judgment appear intact. Alert and cooperative with normal attention span and concentration.  not anxious appearing and not depressed appearing.      Assessment & Plan:  Reports he had a flu shot

## 2012-03-10 ENCOUNTER — Other Ambulatory Visit: Payer: Self-pay | Admitting: Cardiology

## 2012-03-10 MED ORDER — ENALAPRIL MALEATE 5 MG PO TABS: 5.0000 mg | ORAL_TABLET | Freq: Two times a day (BID) | ORAL | Status: DC

## 2012-03-16 ENCOUNTER — Ambulatory Visit (INDEPENDENT_AMBULATORY_CARE_PROVIDER_SITE_OTHER): Payer: Medicare Other | Admitting: Internal Medicine

## 2012-03-16 ENCOUNTER — Encounter: Payer: Self-pay | Admitting: Internal Medicine

## 2012-03-16 VITALS — BP 122/76 | HR 45 | Temp 97.5°F | Wt 199.0 lb

## 2012-03-16 DIAGNOSIS — E785 Hyperlipidemia, unspecified: Secondary | ICD-10-CM

## 2012-03-16 DIAGNOSIS — E119 Type 2 diabetes mellitus without complications: Secondary | ICD-10-CM

## 2012-03-16 DIAGNOSIS — I10 Essential (primary) hypertension: Secondary | ICD-10-CM

## 2012-03-16 DIAGNOSIS — J069 Acute upper respiratory infection, unspecified: Secondary | ICD-10-CM

## 2012-03-16 LAB — COMPREHENSIVE METABOLIC PANEL
Albumin: 3.7 g/dL (ref 3.5–5.2)
Alkaline Phosphatase: 67 U/L (ref 39–117)
BUN: 17 mg/dL (ref 6–23)
CO2: 29 mEq/L (ref 19–32)
Calcium: 9.3 mg/dL (ref 8.4–10.5)
Chloride: 105 mEq/L (ref 96–112)
GFR: 53.78 mL/min — ABNORMAL LOW (ref >60.00)
Glucose, Bld: 110 mg/dL — ABNORMAL HIGH (ref 70–99)
Potassium: 5.4 mEq/L — ABNORMAL HIGH (ref 3.5–5.1)

## 2012-03-16 LAB — LIPID PANEL
Cholesterol: 108 mg/dL (ref 0–200)
Triglycerides: 112 mg/dL (ref 0.0–149.0)

## 2012-03-16 MED ORDER — FLUTICASONE PROPIONATE 50 MCG/ACT NA SUSP: 2.0000 | Freq: Every day | NASAL | Status: DC

## 2012-03-16 NOTE — Patient Instructions (Addendum)
Check the  blood pressure 2 or 3 times a week, be sure it is between 110/60 and 140/85. Also, checked your pulse, if it is consistently less than 45 let me know. Use Flonase in addition to the other nose sprayt, if your sinuses are not improving let me know

## 2012-03-16 NOTE — Assessment & Plan Note (Signed)
Good compliance w/ meds , labs

## 2012-03-16 NOTE — Assessment & Plan Note (Signed)
Due for labs

## 2012-03-16 NOTE — Assessment & Plan Note (Addendum)
Good medication compliance, BP seems to be well-controlled. Pulse is slt low but he is asymptomatic

## 2012-03-16 NOTE — Assessment & Plan Note (Addendum)
Persisting and occasional left-sided facial pain and congestion. Exam is benign. Plan: CT sinus Addendum, pt declined, states will call if symptoms severe or not better.

## 2012-03-16 NOTE — Progress Notes (Signed)
  Subjective:    Patient ID: [name] Little, male    DOB: 1940-12-29, 72 y.o.   MRN: [id number]  HPI Office visit High cholesterol, good medication compliance. Hypertension, good medication compliance, BPs in the ambulatory setting normal. Was seen with URI last month, status post treatment, still has sinus pain and congestion on and off on the left side.  Past Medical History:   Anxiety-depression   Hyperlipidemia   Hypertension   Borderline DM   CAD, CABG 2004 Ischemic cardiomyopathy (EF had been 30-35%. The most recent EF was 50% on echo in September 2009) DJD   GERD   PUD------7-11, EGD showed few small ulcers, esophagus stretched   Headache - s/p neuro eval ? migraine ( 06/2003)   H/o IRON DEFICIENCY   Allergic rhinitis  Past Surgical History:   Coronary artery bypass graft 2004 (LIMA to the LADs, sequential SVG to intermediate and obtuse marginal, sequential SVG to PDA and posterolateral).   Inguinal herniorrhaphy;   Family History:   DM--father   colon ca--no   prostate ca--no   MI--M and F   Social History:   Married , lives w/ wife , no children   tobacco-- quit in the 22s   ETOH-- no  Diet-- regular, room for improvment   Review of Systems Denies chest pain, shortness of breath. No edema. Has decrease a few pounds in weight. Denies fever or chills Some clear nasal discharge and postnasal dripping. Occasional cough, he thinks could be related to dripping. Occasionally, is a slightly dizzy when he stands up, lasts "a split second". Symptoms are not consistent everytime he stands up    Objective:   Physical Exam General -- alert, well-developed  HEENT -- TMs normal, throat w/o redness, face symmetric and not tender to palpation,nose not congested  Lungs -- normal respiratory effort, no intercostal retractions, no accessory muscle use, and normal breath sounds.   Heart-- bradycardic.   Extremities-- +/+++  pretibial edema bilaterally  Psych-- Cognition and  judgment appear intact. Alert and cooperative with normal attention span and concentration.  not anxious appearing and not depressed appearing.      Assessment & Plan:

## 2012-03-18 ENCOUNTER — Encounter: Payer: Self-pay | Admitting: *Deleted

## 2012-04-09 ENCOUNTER — Telehealth: Payer: Self-pay | Admitting: Internal Medicine

## 2012-04-09 DIAGNOSIS — E875 Hyperkalemia: Secondary | ICD-10-CM

## 2012-04-09 NOTE — Telephone Encounter (Signed)
PT also came in to get a appt to re-do labs- I put it on the schedule for Monday but not sure if orders are in chart for him. Sorry

## 2012-04-09 NOTE — Telephone Encounter (Signed)
Pt is having eye xrays sent over from eye doctor. He wants to talk with paz about results when he sees them.

## 2012-04-10 NOTE — Telephone Encounter (Signed)
Orders entered

## 2012-04-13 ENCOUNTER — Other Ambulatory Visit (INDEPENDENT_AMBULATORY_CARE_PROVIDER_SITE_OTHER): Payer: Medicare Other

## 2012-04-13 DIAGNOSIS — E875 Hyperkalemia: Secondary | ICD-10-CM

## 2012-04-20 ENCOUNTER — Other Ambulatory Visit: Payer: Self-pay | Admitting: Internal Medicine

## 2012-04-20 NOTE — Telephone Encounter (Signed)
Refill done.  

## 2012-05-11 ENCOUNTER — Other Ambulatory Visit: Payer: Self-pay | Admitting: Cardiology

## 2012-07-02 HISTORY — PX: CATARACT EXTRACTION: SUR2

## 2012-07-13 ENCOUNTER — Encounter: Payer: Self-pay | Admitting: Lab

## 2012-07-14 ENCOUNTER — Telehealth: Payer: Self-pay | Admitting: Internal Medicine

## 2012-07-14 ENCOUNTER — Ambulatory Visit (INDEPENDENT_AMBULATORY_CARE_PROVIDER_SITE_OTHER): Payer: Medicare Other | Admitting: Internal Medicine

## 2012-07-14 VITALS — BP 118/80 | HR 46 | Temp 98.1°F | Wt 201.0 lb

## 2012-07-14 DIAGNOSIS — F32A Depression, unspecified: Secondary | ICD-10-CM

## 2012-07-14 DIAGNOSIS — E119 Type 2 diabetes mellitus without complications: Secondary | ICD-10-CM

## 2012-07-14 DIAGNOSIS — J329 Chronic sinusitis, unspecified: Secondary | ICD-10-CM

## 2012-07-14 DIAGNOSIS — J069 Acute upper respiratory infection, unspecified: Secondary | ICD-10-CM

## 2012-07-14 DIAGNOSIS — I10 Essential (primary) hypertension: Secondary | ICD-10-CM

## 2012-07-14 DIAGNOSIS — N189 Chronic kidney disease, unspecified: Secondary | ICD-10-CM

## 2012-07-14 DIAGNOSIS — F419 Anxiety disorder, unspecified: Secondary | ICD-10-CM

## 2012-07-14 NOTE — Assessment & Plan Note (Signed)
Stable per last BMP

## 2012-07-14 NOTE — Assessment & Plan Note (Signed)
Under excellent control, no change 

## 2012-07-14 NOTE — Patient Instructions (Addendum)
Next visit 3 months for a yearly check up

## 2012-07-14 NOTE — Assessment & Plan Note (Signed)
Well controlled, continue with same medications,Recent potassium recheck normal.

## 2012-07-14 NOTE — Telephone Encounter (Signed)
Please advise 

## 2012-07-14 NOTE — Assessment & Plan Note (Addendum)
Ongoing symptoms although they are less severe than few months ago. Again I told the patient I'm concerned about sinusitis in the left side, if untreated it could be very serious.  Again the patient declined a CT at likes to wait . Continue with nasal sprays

## 2012-07-14 NOTE — Telephone Encounter (Signed)
Patient states he would like to proceed with CT scan. He also states Dr. Drue Novel may want to look at a CT scan ordered a while back by Dr. Sandria Manly and xrays done by Dr. Pollyann Kennedy.

## 2012-07-14 NOTE — Assessment & Plan Note (Signed)
Not taking Xanax at the present time, I 'll keep it in his med list in case he needs it at some point.

## 2012-07-14 NOTE — Telephone Encounter (Signed)
Ordering a CT sinus

## 2012-07-14 NOTE — Progress Notes (Signed)
  Subjective:    Patient ID: [name] Little, male    DOB: [birth year], 72 y.o.   MRN: [id number]  HPI RoutineOffice visit Since the last time he was here he is feeling well, when asked admits to ongoing sinus congestion, R>L, some pain. Usually has no nasal discharge except for yesterday when he saw a lot of clear discharge from the left nostril.  Past Medical History  Diagnosis Date  . Anxiety and depression   . Hyperlipidemia   . Hypertension   . Diabetes mellitus   . GERD (gastroesophageal reflux disease)   . PUD (peptic ulcer disease) 7/11    EGD showed few small ulcers, esophagues strecthed  . Headache 06/2003    s/p neuro eval ? migraine  . CAD (coronary artery disease)   . Hyperplastic colon polyp 2008  . Iron deficiency   . Arthritis   . Ischemic cardiomyopathy     EF had 30-35%. The most recent EF was 50% on echo in september 2009  . Allergic rhinitis    Past Surgical History  Procedure Laterality Date  . Coronary artery bypass graft  2004    LIMA to the LADs, sequentail SVG to intermediated and obtuse marginal, sequential SVG to PDA and posterolaterla  . Hernia repair      inguinal    Social History:  Married , lives w/ wife , no children  tobacco-- quit in the 66s  ETOH-- no  Diet-- regular, room for improvment  Review of Systems Recent labs reviewed. Good compliance with all medications, ambulatory BPs around 120/80. Very seldom has any heartburn. Still has occasional sleep problems, he decided not to take Xanax at night because it wasn't helping much. To have cataract surgery 07/27/2012      Objective:   Physical Exam BP 118/80  Pulse 46  Temp(Src) 98.1 F (36.7 C) (Oral)  Wt 201 lb (91.173 kg)  BMI 28.84 kg/m2  SpO2 98%\ General -- alert, well-developed, No apparent distress.    HEENT -- TMs normal, throat w/o redness, face symmetric and not tender to palpation, Nose moderately congested. EOMI Lungs -- normal respiratory effort, no intercostal  retractions, no accessory muscle use, and normal breath sounds.   Heart-- normal rate, regular rhythm, no murmur, and no gallop.   Psych-- Cognition and judgment appear intact. Alert and cooperative with normal attention span and concentration.  not anxious appearing and not depressed appearing.       Assessment & Plan:

## 2012-07-15 ENCOUNTER — Encounter: Payer: Self-pay | Admitting: Internal Medicine

## 2012-07-15 NOTE — Telephone Encounter (Signed)
Order entered

## 2012-07-20 ENCOUNTER — Ambulatory Visit (INDEPENDENT_AMBULATORY_CARE_PROVIDER_SITE_OTHER)
Admission: RE | Admit: 2012-07-20 | Discharge: 2012-07-20 | Disposition: A | Discharge status: Home / Self Care | Payer: Medicare Other | Source: Ambulatory Visit | Attending: Internal Medicine | Admitting: Internal Medicine

## 2012-07-20 DIAGNOSIS — J329 Chronic sinusitis, unspecified: Secondary | ICD-10-CM

## 2012-07-30 ENCOUNTER — Encounter: Payer: Self-pay | Admitting: Internal Medicine

## 2012-08-05 ENCOUNTER — Encounter: Payer: Self-pay | Admitting: Internal Medicine

## 2012-08-05 ENCOUNTER — Ambulatory Visit (INDEPENDENT_AMBULATORY_CARE_PROVIDER_SITE_OTHER): Payer: Medicare Other | Admitting: Internal Medicine

## 2012-08-05 VITALS — BP 130/76 | HR 66 | Temp 97.6°F | Wt 204.0 lb

## 2012-08-05 DIAGNOSIS — S99919A Unspecified injury of unspecified ankle, initial encounter: Secondary | ICD-10-CM

## 2012-08-05 DIAGNOSIS — S8990XA Unspecified injury of unspecified lower leg, initial encounter: Secondary | ICD-10-CM

## 2012-08-05 DIAGNOSIS — S99929A Unspecified injury of unspecified foot, initial encounter: Secondary | ICD-10-CM

## 2012-08-05 DIAGNOSIS — W19XXXA Unspecified fall, initial encounter: Secondary | ICD-10-CM

## 2012-08-05 DIAGNOSIS — Z23 Encounter for immunization: Secondary | ICD-10-CM

## 2012-08-05 MED ORDER — MUPIROCIN CALCIUM 2 % EX CREA: TOPICAL_CREAM | Freq: Two times a day (BID) | CUTANEOUS | Status: DC

## 2012-08-05 NOTE — Patient Instructions (Signed)
Clean area with water and soap, pat it dry with a clean towel. Apply Bactroban twice a day for one week. Call anytime if the area gets more swollen, red or if you have fever or see charge. ---  Fall Prevention and Home Safety Falls cause injuries and can affect all age groups. It is possible to use preventive measures to significantly decrease the likelihood of falls. There are many simple measures which can make your home safer and prevent falls. OUTDOORS  Repair cracks and edges of walkways and driveways.  Remove high doorway thresholds.  Trim shrubbery on the main path into your home.  Have good outside lighting.  Clear walkways of tools, rocks, debris, and clutter.  Check that handrails are not broken and are securely fastened. Both sides of steps should have handrails.  Have leaves, snow, and ice cleared regularly.  Use sand or salt on walkways during winter months.  In the garage, clean up grease or oil spills. BATHROOM  Install night lights.  Install grab bars by the toilet and in the tub and shower.  Use non-skid mats or decals in the tub or shower.  Place a plastic non-slip stool in the shower to sit on, if needed.  Keep floors dry and clean up all water on the floor immediately.  Remove soap buildup in the tub or shower on a regular basis.  Secure bath mats with non-slip, double-sided rug tape.  Remove throw rugs and tripping hazards from the floors. BEDROOMS  Install night lights.  Make sure a bedside light is easy to reach.  Do not use oversized bedding.  Keep a telephone by your bedside.  Have a firm chair with side arms to use for getting dressed.  Remove throw rugs and tripping hazards from the floor. KITCHEN  Keep handles on pots and pans turned toward the center of the stove. Use back burners when possible.  Clean up spills quickly and allow time for drying.  Avoid walking on wet floors.  Avoid hot utensils and knives.  Position  shelves so they are not too high or low.  Place commonly used objects within easy reach.  If necessary, use a sturdy step stool with a grab bar when reaching.  Keep electrical cables out of the way.  Do not use floor polish or wax that makes floors slippery. If you must use wax, use non-skid floor wax.  Remove throw rugs and tripping hazards from the floor. STAIRWAYS  Never leave objects on stairs.  Place handrails on both sides of stairways and use them. Fix any loose handrails. Make sure handrails on both sides of the stairways are as long as the stairs.  Check carpeting to make sure it is firmly attached along stairs. Make repairs to worn or loose carpet promptly.  Avoid placing throw rugs at the top or bottom of stairways, or properly secure the rug with carpet tape to prevent slippage. Get rid of throw rugs, if possible.  Have an electrician put in a light switch at the top and bottom of the stairs. OTHER FALL PREVENTION TIPS  Wear low-heel or rubber-soled shoes that are supportive and fit well. Wear closed toe shoes.  When using a stepladder, make sure it is fully opened and both spreaders are firmly locked. Do not climb a closed stepladder.  Add color or contrast paint or tape to grab bars and handrails in your home. Place contrasting color strips on first and last steps.  Learn and use mobility aids as  needed. Install an electrical emergency response system.  Turn on lights to avoid dark areas. Replace light bulbs that burn out immediately. Get light switches that glow.  Arrange furniture to create clear pathways. Keep furniture in the same place.  Firmly attach carpet with non-skid or double-sided tape.  Eliminate uneven floor surfaces.  Select a carpet pattern that does not visually hide the edge of steps.  Be aware of all pets. OTHER HOME SAFETY TIPS  Set the water temperature for 120 F (48.8 C).  Keep emergency numbers on or near the telephone.  Keep  smoke detectors on every level of the home and near sleeping areas. Document Released: 02/08/2002 Document Revised: 08/20/2011 Document Reviewed: 05/10/2011 [facility] Patient Information 2014 [place].

## 2012-08-05 NOTE — Progress Notes (Signed)
  Subjective:    Patient ID: [name] Little, male    DOB: 1940/10/09, 72 y.o.   MRN: [id number]  HPI Acute visit 07/25/2012, he ran into a flat bed cart at [employer], scraped his legs, fell forward, landed on his knees and hands. Concerned because the right pretibial area is red and off.  Past Medical History  Diagnosis Date  . Anxiety and depression   . Hyperlipidemia   . Hypertension   . Diabetes mellitus   . GERD (gastroesophageal reflux disease)   . PUD (peptic ulcer disease) 7/11    EGD showed few small ulcers, esophagues strecthed  . Headache(784.0) 06/2003    s/p neuro eval ? migraine  . CAD (coronary artery disease)   . Hyperplastic colon polyp 2008  . Iron deficiency     h/o  . Arthritis   . Ischemic cardiomyopathy     EF had 30-35%. The most recent EF was 50% on echo in september 2009  . Allergic rhinitis    Past Surgical History  Procedure Laterality Date  . Coronary artery bypass graft  2004    LIMA to the LADs, sequentail SVG to intermediated and obtuse marginal, sequential SVG to PDA and posterolaterla  . Hernia repair      inguinal  . Cataract extraction Right 07-2012    Social History:  Married , lives w/ wife , no children  tobacco-- quit in the 77s  ETOH-- no   Review of Systems Denies fever chills. No discharge from the scrapes. Denies any loss of consciousness, head injury. No neck or back pain.     Objective:   Physical Exam  Constitutional: He appears well-developed and well-nourished. No distress.  Musculoskeletal:       Legs: Skin: He is not diaphoretic.  Psychiatric: He has a normal mood and affect. His behavior is normal. Judgment and thought content normal.          Assessment & Plan:   Skin scoriations, right worse than left leg. Doubt pink  skin around the scratch is cellulitis. Tetanus booster today Fall prevention discussed bactroban See instructions

## 2012-08-12 ENCOUNTER — Telehealth: Payer: Self-pay | Admitting: Internal Medicine

## 2012-08-12 MED ORDER — METOPROLOL TARTRATE 100 MG PO TABS: ORAL_TABLET | ORAL | Status: DC

## 2012-08-12 NOTE — Telephone Encounter (Signed)
Patient is requesting a refill of his Toprol Rx to be sent to his [facility] pharmacy.

## 2012-08-12 NOTE — Telephone Encounter (Signed)
Refill done.  

## 2012-10-12 ENCOUNTER — Telehealth: Payer: Self-pay | Admitting: *Deleted

## 2012-10-12 MED ORDER — SIMVASTATIN 40 MG PO TABS: ORAL_TABLET | ORAL | Status: DC

## 2012-10-12 NOTE — Telephone Encounter (Signed)
Pharmacy called and said that the patient prescription was a transfer order from the mail order pharmacy. Rx filled and made pharmacy aware that the patient are due for labs.  SW//CMA

## 2012-10-14 ENCOUNTER — Encounter: Payer: Self-pay | Admitting: Internal Medicine

## 2012-10-14 ENCOUNTER — Ambulatory Visit (INDEPENDENT_AMBULATORY_CARE_PROVIDER_SITE_OTHER): Payer: Medicare Other | Admitting: Internal Medicine

## 2012-10-14 VITALS — BP 145/90 | HR 61 | Temp 97.6°F | Ht 70.0 in | Wt 206.0 lb

## 2012-10-14 DIAGNOSIS — I10 Essential (primary) hypertension: Secondary | ICD-10-CM

## 2012-10-14 DIAGNOSIS — F329 Major depressive disorder, single episode, unspecified: Secondary | ICD-10-CM

## 2012-10-14 DIAGNOSIS — Z125 Encounter for screening for malignant neoplasm of prostate: Secondary | ICD-10-CM

## 2012-10-14 DIAGNOSIS — F32A Depression, unspecified: Secondary | ICD-10-CM

## 2012-10-14 DIAGNOSIS — E785 Hyperlipidemia, unspecified: Secondary | ICD-10-CM

## 2012-10-14 DIAGNOSIS — Z0000 Encounter for general adult medical examination without abnormal findings: Secondary | ICD-10-CM

## 2012-10-14 DIAGNOSIS — K219 Gastro-esophageal reflux disease without esophagitis: Secondary | ICD-10-CM

## 2012-10-14 DIAGNOSIS — F419 Anxiety disorder, unspecified: Secondary | ICD-10-CM

## 2012-10-14 DIAGNOSIS — I2510 Atherosclerotic heart disease of native coronary artery without angina pectoris: Secondary | ICD-10-CM

## 2012-10-14 DIAGNOSIS — J329 Chronic sinusitis, unspecified: Secondary | ICD-10-CM

## 2012-10-14 DIAGNOSIS — F341 Dysthymic disorder: Secondary | ICD-10-CM

## 2012-10-14 DIAGNOSIS — E119 Type 2 diabetes mellitus without complications: Secondary | ICD-10-CM

## 2012-10-14 LAB — HEMOGLOBIN A1C: Hgb A1c MFr Bld: 6.2 % (ref 4.6–6.5)

## 2012-10-14 LAB — CBC WITH DIFFERENTIAL/PLATELET
Basophils Relative: 0.8 % (ref 0.0–3.0)
Eosinophils Absolute: 1.1 10*3/uL — ABNORMAL HIGH (ref 0.0–0.7)
Hemoglobin: 15.2 g/dL (ref 13.0–17.0)
Lymphs Abs: 1.6 10*3/uL (ref 0.7–4.0)
MCHC: 33 g/dL (ref 30.0–36.0)
MCV: 93 fl (ref 78.0–100.0)
Monocytes Absolute: 0.6 10*3/uL (ref 0.1–1.0)
Neutro Abs: 4.3 10*3/uL (ref 1.4–7.7)
RBC: 4.96 Mil/uL (ref 4.22–5.81)

## 2012-10-14 LAB — ALT: ALT: 21 U/L (ref 0–53)

## 2012-10-14 LAB — BASIC METABOLIC PANEL
Chloride: 103 mEq/L (ref 96–112)
GFR: 56.03 mL/min — ABNORMAL LOW (ref >60.00)
Glucose, Bld: 103 mg/dL — ABNORMAL HIGH (ref 70–99)
Potassium: 4.4 mEq/L (ref 3.5–5.1)
Sodium: 138 mEq/L (ref 135–145)

## 2012-10-14 LAB — LIPID PANEL: Cholesterol: 117 mg/dL (ref 0–200)

## 2012-10-14 MED ORDER — ZOLPIDEM TARTRATE 5 MG PO TABS
5.0000 mg | ORAL_TABLET | Freq: Every evening | ORAL | Status: DC | PRN
Start: 2012-10-14 — End: 2013-01-14

## 2012-10-14 MED ORDER — OMEPRAZOLE 40 MG PO CPDR: 40.0000 mg | DELAYED_RELEASE_CAPSULE | Freq: Every day | ORAL | Status: DC

## 2012-10-14 NOTE — Assessment & Plan Note (Signed)
On Zantac, no classic heartburn but reports cough when he lays down. Suspect cough could be from GERD ,chronic sinusitis or ACE inhibitors. Plan: Go back to PPIs, if cough not improved consider  discontinue ACE inhibitors

## 2012-10-14 NOTE — Assessment & Plan Note (Addendum)
Td 09 pneumonia shot 2005 and 09-2011 Shingles  Shot-- prescription provided before, has not gotten it, declined a rx today ($) colonoscopy 03/2006, 2 hyperplastic polyps, Next 10 years PSAs have been consistently normal, DRE normal today, PSA check Exercise  And diet discussed.

## 2012-10-14 NOTE — Assessment & Plan Note (Signed)
Asymptomatic. Decreased right carotid pulse? 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 Plan: carotid u/s

## 2012-10-14 NOTE — Patient Instructions (Addendum)
Ambien 5 mg at bedtime to help you sleep Discontinue Zantac take omeprazole 40 mg one before breakfast Next visit in 3 months   Diabetes and Foot Care Diabetes may cause you to have a poor blood supply (circulation) to your legs and feet. Because of this, the skin may be thinner, break easier, and heal more slowly. You also may have nerve damage in your legs and feet causing decreased feeling. You may not notice minor injuries to your feet that could lead to serious problems or infections. Taking care of your feet is one of the most important things you can do for yourself.  HOME CARE INSTRUCTIONS  Do not go barefoot. Bare feet are easily injured.  Check your feet daily for blisters, cuts, and redness.  Wash your feet with warm water (not hot) and mild soap. Pat your feet and between your toes until completely dry.  Apply a moisturizing lotion that does not contain alcohol or petroleum jelly to the dry skin on your feet and to dry brittle toenails. Do not put it between your toes.  Trim your toenails straight across. Do not dig under them or around the cuticle.  Do not cut corns or calluses, or try to remove them with medicine.  Wear clean cotton socks or stockings every day. Make sure they are not too tight. Do not wear knee high stockings since they may decrease blood flow to your legs.  Wear leather shoes that fit properly and have enough cushioning. To break in new shoes, wear them just a few hours a day to avoid injuring your feet.  Wear shoes at all times, even in the house.  Do not cross your legs. This may decrease the blood flow to your feet.  If you find a minor scrape, cut, or break in the skin on your feet, keep it and the skin around it clean and dry. These areas may be cleansed with mild soap and water. Do not use peroxide, alcohol, iodine or Merthiolate.  When you remove an adhesive bandage, be sure not to harm the skin around it.  If you have a wound, look at it  several times a day to make sure it is healing.  Do not use heating pads or hot water bottles. Burns can occur. If you have lost feeling in your feet or legs, you may not know it is happening until it is too late.  Report any cuts, sores or bruises to your caregiver. Do not wait! SEEK MEDICAL CARE IF:   You have an injury that is not healing or you notice redness, numbness, burning, or tingling.  Your feet always feel cold.  You have pain or cramps in your legs and feet. SEEK IMMEDIATE MEDICAL CARE IF:   There is increasing redness, swelling, or increasing pain in the wound.  There is a red line that goes up your leg.  Pus is coming from a wound.  You develop an unexplained oral temperature above 102 F (38.9 C), or as your caregiver suggests.  You notice a bad smell coming from an ulcer or wound. MAKE SURE YOU:   Understand these instructions.  Will watch your condition.  Will get help right away if you are not doing well or get worse. Document Released: 02/16/2000 Document Revised: 05/13/2011 Document Reviewed: 08/24/2008 [facility] Patient Information 2014 [place], [place].    Fall Prevention and Home Safety Falls cause injuries and can affect all age groups. It is possible to use preventive measures to significantly  decrease the likelihood of falls. There are many simple measures which can make your home safer and prevent falls. OUTDOORS  Repair cracks and edges of walkways and driveways.  Remove high doorway thresholds.  Trim shrubbery on the main path into your home.  Have good outside lighting.  Clear walkways of tools, rocks, debris, and clutter.  Check that handrails are not broken and are securely fastened. Both sides of steps should have handrails.  Have leaves, snow, and ice cleared regularly.  Use sand or salt on walkways during winter months.  In the garage, clean up grease or oil spills. BATHROOM  Install night lights.  Install grab bars by the  toilet and in the tub and shower.  Use non-skid mats or decals in the tub or shower.  Place a plastic non-slip stool in the shower to sit on, if needed.  Keep floors dry and clean up all water on the floor immediately.  Remove soap buildup in the tub or shower on a regular basis.  Secure bath mats with non-slip, double-sided rug tape.  Remove throw rugs and tripping hazards from the floors. BEDROOMS  Install night lights.  Make sure a bedside light is easy to reach.  Do not use oversized bedding.  Keep a telephone by your bedside.  Have a firm chair with side arms to use for getting dressed.  Remove throw rugs and tripping hazards from the floor. KITCHEN  Keep handles on pots and pans turned toward the center of the stove. Use back burners when possible.  Clean up spills quickly and allow time for drying.  Avoid walking on wet floors.  Avoid hot utensils and knives.  Position shelves so they are not too high or low.  Place commonly used objects within easy reach.  If necessary, use a sturdy step stool with a grab bar when reaching.  Keep electrical cables out of the way.  Do not use floor polish or wax that makes floors slippery. If you must use wax, use non-skid floor wax.  Remove throw rugs and tripping hazards from the floor. STAIRWAYS  Never leave objects on stairs.  Place handrails on both sides of stairways and use them. Fix any loose handrails. Make sure handrails on both sides of the stairways are as long as the stairs.  Check carpeting to make sure it is firmly attached along stairs. Make repairs to worn or loose carpet promptly.  Avoid placing throw rugs at the top or bottom of stairways, or properly secure the rug with carpet tape to prevent slippage. Get rid of throw rugs, if possible.  Have an electrician put in a light switch at the top and bottom of the stairs. OTHER FALL PREVENTION TIPS  Wear low-heel or rubber-soled shoes that are  supportive and fit well. Wear closed toe shoes.  When using a stepladder, make sure it is fully opened and both spreaders are firmly locked. Do not climb a closed stepladder.  Add color or contrast paint or tape to grab bars and handrails in your home. Place contrasting color strips on first and last steps.  Learn and use mobility aids as needed. Install an electrical emergency response system.  Turn on lights to avoid dark areas. Replace light bulbs that burn out immediately. Get light switches that glow.  Arrange furniture to create clear pathways. Keep furniture in the same place.  Firmly attach carpet with non-skid or double-sided tape.  Eliminate uneven floor surfaces.  Select a carpet pattern that does not  visually hide the edge of steps.  Be aware of all pets. OTHER HOME SAFETY TIPS  Set the water temperature for 120 F (48.8 C).  Keep emergency numbers on or near the telephone.  Keep smoke detectors on every level of the home and near sleeping areas. Document Released: 02/08/2002 Document Revised: 08/20/2011 Document Reviewed: 05/10/2011 [facility] Patient Information 2014 [place], [place].

## 2012-10-14 NOTE — Assessment & Plan Note (Signed)
Labs

## 2012-10-14 NOTE — Assessment & Plan Note (Signed)
Well controlled per amb BPs. Some edema, consider diuretic

## 2012-10-14 NOTE — Assessment & Plan Note (Signed)
Anxiety- depression not an issue, still unable to sleep , , Alprazolam not helping. Plan: Switch to Ambien 5 mg

## 2012-10-14 NOTE — Assessment & Plan Note (Signed)
Due for labs. Sees the eye Dr. yearly, feet care discussed. Check an A1c

## 2012-10-14 NOTE — Assessment & Plan Note (Signed)
Self discontinued astepro and Flonase, using OTC nasal spray. Strongly recommend to avoid any OTC decongestants, spray or oral

## 2012-10-14 NOTE — Progress Notes (Signed)
Subjective:    Patient ID: [name] Little, male    DOB: 22-Sep-1940, 72 y.o.   MRN: [id number]  HPI Here for Medicare AWV:  1. Risk factors based on Past M, S, F history: reviewed  2. Physical Activities: still does some yard work, home chores , no routine exercise  3. Depression/mood: Not an issue at this point, he does have difficulty sleeping 4. Hearing: No problems w/ normal conversation; has B tinnitus not getting worse , at some point was seen by audiologist, was offered an aid.  5. ADL's: Independent , still drives  6. Fall Risk: had 2 incidents, tripped and fell, very rarely feels  dizziness--->precautions discussed ,  7. Home Safety: does feel safe at home  8. Height, weight, &visual acuity: see VS, sees eye doctor yearly  9. Counseling: provided  10. Labs ordered based on risk factors: if needed  11. Referral Coordination: if needed  12. Care Plan, see assessment and plan  13. Cognitive Assessment: Motor skills and cognition appropriate for age   In addition, today we discussed the following: Insomnia,Self discontinue Xanax, didn't help. Denies anxiety or depression High cholesterol, good medication compliance. Hypertension, good medication compliance, ambulatory BPs usually 120/80, 135/85. Chronic sinusitis, discontinue the 2 nasal sprays, using OTC and doing "okay".  Also reports chronic, on and off cough mostly when he lays down . GERD, on Zantac, denies heartburn.  Past Medical History  Diagnosis Date  . Anxiety and depression   . Hyperlipidemia   . Hypertension   . Diabetes mellitus   . GERD (gastroesophageal reflux disease)   . PUD (peptic ulcer disease) 7/11    EGD showed few small ulcers, esophagues strecthed  . Headache(784.0) 06/2003    s/p neuro eval ? migraine  . CAD (coronary artery disease)   . Hyperplastic colon polyp 2008  . Iron deficiency     h/o  . Arthritis   . Ischemic cardiomyopathy     EF had 30-35%. The most recent EF was 50% on echo in  september 2009  . Allergic rhinitis    Past Surgical History  Procedure Laterality Date  . Coronary artery bypass graft  2004    LIMA to the LADs, sequentail SVG to intermediated and obtuse marginal, sequential SVG to PDA and posterolaterla  . Hernia repair      inguinal  . Cataract extraction Right 07-2012   History   Social History  . Marital Status: Married    Spouse Name: N/A    Number of Children: 0  . Years of Education: N/A   Occupational History  . retired Production designer, theatre/television/film     Social History Main Topics  . Smoking status: Former Smoker    Quit date: 12/16/1976  . Smokeless tobacco: Never Used  . Alcohol Use: No  . Drug Use: No  . Sexual Activity: Not on file   Other Topics Concern  . Not on file   Social History Narrative   Married , lives w/ wife , no children         Family History  Problem Relation Age of Onset  . Diabetes Father   . Colon cancer Neg Hx   . Prostate cancer Neg Hx   . Heart attack Mother   . Heart attack Father     Review of Systems No nausea,vomiting, diarrhea or blood in the stools No chest pain or shortness or breath Denies increasing. No dysuria, gross motor or difficulty urinating    Objective:   Physical Exam  BP 145/90  Pulse 61  Ht 5\' 10"  (1.778 m)  Wt 206 lb (93.441 kg)  BMI 29.56 kg/m2  SpO2 91% General -- alert, well-developed,NAD .   Neck --no thyromegaly , decreased R carotid pulse? No bruit HEENT -- TMs normal, throat w/o redness, face symmetric and not tender to palpation Lungs -- normal respiratory effort, no intercostal retractions, no accessory muscle use, and normal breath sounds.   Heart-- normal rate, regular rhythm, no murmur, and no gallop.   Abdomen--soft, non-tender, no distention, no masses, no bruit.   Extremities-- +/+++ pretibial edema bilaterally Rectal-- No external abnormalities noted. Normal sphincter tone. No rectal masses or tenderness. Brown stool  Prostate:  Prostate gland firm and smooth, no  enlargement, nodularity, tenderness, mass, asymmetry or induration. Neurologic-- alert & oriented X3 and strength normal in all extremities. Psych-- Cognition and judgment appear intact. Alert and cooperative with normal attention span and concentration.  not anxious appearing and not depressed appearing.       Assessment & Plan:

## 2012-10-16 ENCOUNTER — Telehealth: Payer: Self-pay

## 2012-10-16 ENCOUNTER — Encounter: Payer: Self-pay | Admitting: Internal Medicine

## 2012-10-16 NOTE — Telephone Encounter (Signed)
Please advise. Results were released via Mychart with comments. Pt still has concerns.

## 2012-10-16 NOTE — Telephone Encounter (Signed)
Spoke with patient concerning lab results. Reviewed and advise of MD comments.

## 2012-10-21 ENCOUNTER — Encounter (INDEPENDENT_AMBULATORY_CARE_PROVIDER_SITE_OTHER): Payer: Medicare Other

## 2012-10-21 DIAGNOSIS — I2510 Atherosclerotic heart disease of native coronary artery without angina pectoris: Secondary | ICD-10-CM

## 2012-10-21 DIAGNOSIS — I6529 Occlusion and stenosis of unspecified carotid artery: Secondary | ICD-10-CM

## 2012-12-06 ENCOUNTER — Other Ambulatory Visit: Payer: Self-pay | Admitting: Cardiology

## 2012-12-10 ENCOUNTER — Encounter: Payer: Self-pay | Admitting: Internal Medicine

## 2012-12-29 ENCOUNTER — Other Ambulatory Visit: Payer: Self-pay | Admitting: *Deleted

## 2012-12-29 ENCOUNTER — Other Ambulatory Visit: Payer: Self-pay | Admitting: Cardiology

## 2012-12-30 MED ORDER — SIMVASTATIN 80 MG PO TABS: 80.0000 mg | ORAL_TABLET | Freq: Every day | ORAL | Status: DC

## 2012-12-30 NOTE — Telephone Encounter (Signed)
rx refilled per protocol. DJR  

## 2013-01-14 ENCOUNTER — Encounter: Payer: Self-pay | Admitting: Internal Medicine

## 2013-01-14 ENCOUNTER — Ambulatory Visit (INDEPENDENT_AMBULATORY_CARE_PROVIDER_SITE_OTHER): Payer: Medicare Other | Admitting: Internal Medicine

## 2013-01-14 VITALS — BP 127/80 | HR 62 | Temp 97.8°F | Wt 200.0 lb

## 2013-01-14 DIAGNOSIS — E785 Hyperlipidemia, unspecified: Secondary | ICD-10-CM

## 2013-01-14 DIAGNOSIS — F32A Depression, unspecified: Secondary | ICD-10-CM

## 2013-01-14 DIAGNOSIS — N189 Chronic kidney disease, unspecified: Secondary | ICD-10-CM

## 2013-01-14 DIAGNOSIS — E119 Type 2 diabetes mellitus without complications: Secondary | ICD-10-CM

## 2013-01-14 DIAGNOSIS — F329 Major depressive disorder, single episode, unspecified: Secondary | ICD-10-CM

## 2013-01-14 DIAGNOSIS — F341 Dysthymic disorder: Secondary | ICD-10-CM

## 2013-01-14 DIAGNOSIS — I10 Essential (primary) hypertension: Secondary | ICD-10-CM

## 2013-01-14 NOTE — Progress Notes (Signed)
  Subjective:    Patient ID: [name] Little, male    DOB: 1940-12-08, 72 y.o.   MRN: [id number]  HPI Routine office visit Hypertension--good medication compliance, ambulatory BPs within normal Diabetes--Sees the eye MD regularly 2 weeks ago noted the third right toenail to be dark, denies pain, discharge or injury. Insomnia ----since last time he was here, he decided not to takeAmbien, his taking melatonin with excellent results.  Past Medical History  Diagnosis Date  . Anxiety and depression   . Hyperlipidemia   . Hypertension   . Diabetes mellitus   . GERD (gastroesophageal reflux disease)   . PUD (peptic ulcer disease) 7/11    EGD showed few small ulcers, esophagues strecthed  . Headache(784.0) 06/2003    s/p neuro eval ? migraine  . CAD (coronary artery disease)   . Hyperplastic colon polyp 2008  . Iron deficiency     h/o  . Arthritis   . Ischemic cardiomyopathy     EF had 30-35%. The most recent EF was 50% on echo in september 2009  . Allergic rhinitis    Past Surgical History  Procedure Laterality Date  . Coronary artery bypass graft  2004    LIMA to the LADs, sequentail SVG to intermediated and obtuse marginal, sequential SVG to PDA and posterolaterla  . Hernia repair      inguinal  . Cataract extraction Right 07-2012    Review of Systems Denies chest pain or shortness or breath No nausea, vomiting, diarrhea. Some afternoons he feels sleepy but only if he is not engage in some sort of activity. Snoring some     Objective:   Physical Exam BP 127/80  Pulse 62  Temp(Src) 97.8 F (36.6 C)  Wt 200 lb (90.719 kg)  SpO2 98% General -- alert, well-developed, NAD.   Lungs -- normal respiratory effort, no intercostal retractions, no accessory muscle use, and normal breath sounds.  Heart-- normal rate, regular rhythm, no murmur.  Extremities-- no pretibial edema bilaterally ; third right toenail Is thick, dystrophic, slightly dark mostly at the base (blood  underneath?),Skin is normal  Neurologic--  alert & oriented X3.   Psych-- Cognition and judgment appear intact. Cooperative with normal attention span and concentration. No anxious appearing , no depressed appearing.       Assessment & Plan:  Dystrophic nail, Change in the color of the nail, see history of present illness. Recommend observation, if the darkness goes beyond the nail to the skin he needs to let me know.  Recent labs reviewed, no need for labs today. He already had a flu shot

## 2013-01-14 NOTE — Progress Notes (Signed)
Pre visit review using our clinic review tool, if applicable. No additional management support is needed unless otherwise documented below in the visit note. 

## 2013-01-14 NOTE — Assessment & Plan Note (Signed)
Well-controlled, no change 

## 2013-01-14 NOTE — Assessment & Plan Note (Signed)
Well-controlled, Good compliance with medications without apparent side effects

## 2013-01-14 NOTE — Assessment & Plan Note (Signed)
Labs BMP is satisfactory

## 2013-01-14 NOTE — Patient Instructions (Addendum)
Next visit in 4 months for a  diabetes  follow up . No Fasting Please make an appointment

## 2013-01-14 NOTE — Assessment & Plan Note (Signed)
Last A1c great

## 2013-01-14 NOTE — Assessment & Plan Note (Signed)
See previous entry, decided not to take Ambien, taking melatonin OTC and doing very well. Plan-- no change

## 2013-01-29 ENCOUNTER — Other Ambulatory Visit: Payer: Self-pay | Admitting: Cardiology

## 2013-01-31 ENCOUNTER — Other Ambulatory Visit: Payer: Self-pay | Admitting: Internal Medicine

## 2013-02-01 NOTE — Telephone Encounter (Signed)
Omeprazole refilled per protocol 

## 2013-03-02 ENCOUNTER — Other Ambulatory Visit: Payer: Self-pay | Admitting: Internal Medicine

## 2013-03-02 NOTE — Telephone Encounter (Signed)
rx refilled per protocol. DJR  

## 2013-03-09 ENCOUNTER — Other Ambulatory Visit: Payer: Self-pay | Admitting: Cardiology

## 2013-03-12 ENCOUNTER — Other Ambulatory Visit: Payer: Self-pay | Admitting: Cardiology

## 2013-03-12 ENCOUNTER — Other Ambulatory Visit: Payer: Self-pay

## 2013-03-12 MED ORDER — ENALAPRIL MALEATE 5 MG PO TABS: ORAL_TABLET | ORAL | Status: DC

## 2013-03-23 ENCOUNTER — Encounter: Payer: Self-pay | Admitting: Internal Medicine

## 2013-03-23 ENCOUNTER — Ambulatory Visit (INDEPENDENT_AMBULATORY_CARE_PROVIDER_SITE_OTHER): Payer: Medicare HMO | Admitting: Internal Medicine

## 2013-03-23 VITALS — BP 147/74 | HR 52 | Temp 97.5°F | Wt 200.0 lb

## 2013-03-23 DIAGNOSIS — F329 Major depressive disorder, single episode, unspecified: Secondary | ICD-10-CM

## 2013-03-23 DIAGNOSIS — F419 Anxiety disorder, unspecified: Principal | ICD-10-CM

## 2013-03-23 DIAGNOSIS — F32A Depression, unspecified: Secondary | ICD-10-CM

## 2013-03-23 DIAGNOSIS — F341 Dysthymic disorder: Secondary | ICD-10-CM

## 2013-03-23 MED ORDER — TEMAZEPAM 7.5 MG PO CAPS: 7.5000 mg | ORAL_CAPSULE | Freq: Every evening | ORAL | Status: DC | PRN

## 2013-03-23 NOTE — Progress Notes (Signed)
   Subjective:    Patient ID: [name] Little, male    DOB: 1940/03/21, 73 y.o.   MRN: [id number]  HPI Here to discuss the following issues Insomnia, was well-controlled with melatonin but here lately has not been able to rest well at night. Also reports that his eye lids are weak, when at rest he has a hard time keeping the eyes open and sometimes he has to essentially open his eyes with his fingers despite the fact that he does not feel sleepy. He remains active and able to do yardwork without problems.  Past Medical History  Diagnosis Date  . Anxiety and depression   . Hyperlipidemia   . Hypertension   . Diabetes mellitus   . GERD (gastroesophageal reflux disease)   . PUD (peptic ulcer disease) 7/11    EGD showed few small ulcers, esophagues strecthed  . Headache(784.0) 06/2003    s/p neuro eval ? migraine  . CAD (coronary artery disease)   . Hyperplastic colon polyp 2008  . Iron deficiency     h/o  . Arthritis   . Ischemic cardiomyopathy     EF had 30-35%. The most recent EF was 50% on echo in september 2009  . Allergic rhinitis    Past Surgical History  Procedure Laterality Date  . Coronary artery bypass graft  2004    LIMA to the LADs, sequentail SVG to intermediated and obtuse marginal, sequential SVG to PDA and posterolaterla  . Hernia repair      inguinal  . Cataract extraction Right 07-2012   History   Social History  . Marital Status: Married    Spouse Name: N/A    Number of Children: 0  . Years of Education: N/A   Occupational History  . retired Freight forwarder     Social History Main Topics  . Smoking status: Former Smoker    Quit date: 12/16/1976  . Smokeless tobacco: Never Used  . Alcohol Use: No  . Drug Use: No  . Sexual Activity: Not on file   Other Topics Concern  . Not on file   Social History Narrative   Married , lives w/ wife , no children          Review of Systems Denies diplopia or dysphagia No other muscle weaknesses Admits to mild  snoring but he's not   sleepy throughout the day. His vision remains normal. Occasionally he gets red eyes but no eye discharge.     Objective:   Physical Exam BP 147/74  Pulse 52  Temp(Src) 97.5 F (36.4 C) (Oral)  Wt 200 lb (90.719 kg)  SpO2 99% General -- alert, well-developed, NAD.   Lungs -- normal respiratory effort, no intercostal retractions, no accessory muscle use, and normal breath sounds.   Extremities-- no pretibial edema bilaterally  Neurologic--  alert & oriented X3. Speech normal, gait normal, strength normal in all extremities.  EOMI, PERLA ; Face symmetric. Psych-- Cognition and judgment appear intact. Cooperative with normal attention span and concentration. No anxious or depressed appearing.       Assessment & Plan:  Dystrophic nails--- see last office visit, per patient report is getting better  Difficulty opening his eyes--will treat insomnia first, if after insomnia is well-controlled he still has more problems or if he gets worse he will need to see neurology to rule out myasthenia gravis

## 2013-03-23 NOTE — Patient Instructions (Signed)
TakeTemazepam one or 2 tablets as needed for sleep, if that is not working let me know Please come back in approximately 3 months for a checkup. Okay to continue with melatonin Please read the information about good sleep habits

## 2013-03-23 NOTE — Assessment & Plan Note (Addendum)
problems with insomnia resurfaced despite taking melatonin See previous entries, did not like to try Ambien; historically  Xanax didn't help Plan: Continue melatonin, add  Temazepam Good sleep habits discussed

## 2013-03-23 NOTE — Progress Notes (Signed)
Pre visit review using our clinic review tool, if applicable. No additional management support is needed unless otherwise documented below in the visit note. 

## 2013-04-07 ENCOUNTER — Other Ambulatory Visit: Payer: Self-pay | Admitting: Cardiology

## 2013-04-08 ENCOUNTER — Ambulatory Visit (INDEPENDENT_AMBULATORY_CARE_PROVIDER_SITE_OTHER): Payer: Commercial Managed Care - HMO | Admitting: Cardiology

## 2013-04-08 ENCOUNTER — Encounter: Payer: Self-pay | Admitting: Cardiology

## 2013-04-08 VITALS — BP 140/72 | HR 53 | Ht 70.0 in | Wt 200.0 lb

## 2013-04-08 DIAGNOSIS — I2510 Atherosclerotic heart disease of native coronary artery without angina pectoris: Secondary | ICD-10-CM

## 2013-04-08 MED ORDER — ENALAPRIL MALEATE 5 MG PO TABS: ORAL_TABLET | ORAL | Status: DC

## 2013-04-08 NOTE — Patient Instructions (Signed)
The current medical regimen is effective;  continue present plan and medications.  Follow up in 1 year with Dr Hochrein.  You will receive a letter in the mail 2 months before you are due.  Please call us when you receive this letter to schedule your follow up appointment.  

## 2013-04-08 NOTE — Progress Notes (Signed)
HPI The patient returns for one year follow up.  Since I last saw him he has done well.    He denies any new symptoms. The patient denies any new symptoms such as chest discomfort, neck or arm discomfort. There has been no new shortness of breath, PND or orthopnea. There have been no reported palpitations, presyncope or syncope.  He says that he works in the yard without limitations.    Allergies  Allergen Reactions  . Telmisartan     REACTION: diarrhea    Current Outpatient Prescriptions  Medication Sig Dispense Refill  . aspirin 81 MG chewable tablet 2 daily.       Marland Kitchen co-enzyme Q-10 50 MG capsule Take 300 mg by mouth daily.       . enalapril (VASOTEC) 5 MG tablet TAKE 1 TABLET BY MOUTH TWICE A DAY AS DIRECTED  60 tablet  11  . fish oil-omega-3 fatty acids 1000 MG capsule Take 2 g by mouth daily.        . Flaxseed, Linseed, (FLAX SEED OIL PO) Take by mouth.       . MELATONIN PO Take 10 mg by mouth.      . metoprolol (LOPRESSOR) 100 MG tablet TAKE 1 TABLET BY MOUTH TWICE A DAY  60 tablet  3  . niacin 500 MG tablet Take 1,500 mg by mouth at bedtime.       Marland Kitchen omeprazole (PRILOSEC) 40 MG capsule TAKE ONE CAPSULE EVERY DAY  30 capsule  5  . simvastatin (ZOCOR) 80 MG tablet Take 1 tablet (80 mg total) by mouth at bedtime.  90 tablet  1   No current facility-administered medications for this visit.    Past Medical History  Diagnosis Date  . Anxiety and depression   . Hyperlipidemia   . Hypertension   . Diabetes mellitus   . GERD (gastroesophageal reflux disease)   . PUD (peptic ulcer disease) 7/11    EGD showed few small ulcers, esophagues strecthed  . Headache(784.0) 06/2003    s/p neuro eval ? migraine  . CAD (coronary artery disease)   . Hyperplastic colon polyp 2008  . Iron deficiency     h/o  . Arthritis   . Ischemic cardiomyopathy     EF had 30-35%. The most recent EF was 50% on echo in september 2009  . Allergic rhinitis     Past Surgical History  Procedure Laterality  Date  . Coronary artery bypass graft  2004    LIMA to the LADs, sequentail SVG to intermediated and obtuse marginal, sequential SVG to PDA and posterolaterla  . Hernia repair      inguinal  . Cataract extraction Right 07-2012    ROS: As stated in the HPI and negative for all other systems.  PHYSICAL EXAM BP 140/72  Pulse 53  Ht 5\' 10"  (1.778 m)  Wt 200 lb (90.719 kg)  BMI 28.70 kg/m2 GENERAL:  Well appearing HEENT:  Pupils equal round and reactive, fundi not visualized, oral mucosa unremarkable, dentures NECK:  No jugular venous distention, waveform within normal limits, carotid upstroke brisk and symmetric, no bruits, no thyromegaly LUNGS:  Clear to auscultation bilaterally BACK:  No CVA tenderness CHEST:  Unremarkable HEART:  PMI not displaced or sustained,S1 and S2 within normal limits, no S3, no S4, no clicks, no rubs, no murmurs ABD:  Flat, positive bowel sounds normal in frequency in pitch, no bruits, no rebound, no guarding, no midline pulsatile mass, no hepatomegaly, no splenomegaly EXT:  2 plus pulses throughout, no edema, no cyanosis no clubbing  EKG:  Sinus bradycardia, rate 53, old inferior infarct, possible old anteroseptal infarct, nonspecific lateral ST flattening. PACs   04/08/2013  ASSESSMENT AND PLAN  CAD -  The patient has no new symptoms since his stress test last year.  No further cardiovascular testing is indicated.  We will continue with aggressive risk reduction and meds as listed.   HYPERTENSION -  The blood pressure is at target. No change in medications is indicated. We will continue with therapeutic lifestyle changes (TLC).   HYPERLIPIDEMIA -  His LDL was 59 with an HDL of 30.6 most recently. He should have this repeated at his upcoming physical in January with a goal LDL less than 70.

## 2013-04-30 ENCOUNTER — Other Ambulatory Visit: Payer: Self-pay | Admitting: Internal Medicine

## 2013-05-13 ENCOUNTER — Encounter: Payer: Self-pay | Admitting: Internal Medicine

## 2013-05-13 ENCOUNTER — Ambulatory Visit (INDEPENDENT_AMBULATORY_CARE_PROVIDER_SITE_OTHER): Payer: Medicare HMO | Admitting: Internal Medicine

## 2013-05-13 VITALS — BP 134/81 | HR 60 | Temp 98.2°F | Wt 199.0 lb

## 2013-05-13 DIAGNOSIS — F329 Major depressive disorder, single episode, unspecified: Secondary | ICD-10-CM

## 2013-05-13 DIAGNOSIS — F32A Depression, unspecified: Secondary | ICD-10-CM

## 2013-05-13 DIAGNOSIS — E119 Type 2 diabetes mellitus without complications: Secondary | ICD-10-CM

## 2013-05-13 DIAGNOSIS — F341 Dysthymic disorder: Secondary | ICD-10-CM

## 2013-05-13 DIAGNOSIS — I10 Essential (primary) hypertension: Secondary | ICD-10-CM

## 2013-05-13 DIAGNOSIS — F419 Anxiety disorder, unspecified: Secondary | ICD-10-CM

## 2013-05-13 NOTE — Progress Notes (Signed)
Pre visit review using our clinic review tool, if applicable. No additional management support is needed unless otherwise documented below in the visit note. 

## 2013-05-13 NOTE — Progress Notes (Signed)
Subjective:    Patient ID: [name] Little, male    DOB: 25-Sep-1940, 73 y.o.   MRN: [id number]  DOS:  05/13/2013 Type of  visit: ROV   Diabetes, due for a A1c, on diet control only. On ARBs Insomnia ----decided not to take temazepam mostly due to the cost, has changed some of his habits, no TV in the bedroom and he is doing better. Last time he is also c/o difficulty opening his eyes, went to see his eye doctor, was told it was probably muscle atrophy, he suggested no further workup. Denies diplopia. Hypertension, good medication compliance, ambulatory BPs around 120-135/80   ROS DM---   (-) LE paresthesias  No  CP, SOB No palpitations, no lower extremity edema Denies  nausea, vomiting diarrhea  Denies  blood in the stools Anxiety -- sx well controlled     Past Medical History  Diagnosis Date  . Anxiety and depression   . Hyperlipidemia   . Hypertension   . Diabetes mellitus   . GERD (gastroesophageal reflux disease)   . PUD (peptic ulcer disease) 7/11    EGD showed few small ulcers, esophagues strecthed  . Headache(784.0) 06/2003    s/p neuro eval ? migraine  . CAD (coronary artery disease)   . Hyperplastic colon polyp 2008  . Iron deficiency     h/o  . Arthritis   . Ischemic cardiomyopathy     EF had 30-35%. The most recent EF was 50% on echo in september 2009  . Allergic rhinitis     Past Surgical History  Procedure Laterality Date  . Coronary artery bypass graft  2004    LIMA to the LADs, sequentail SVG to intermediated and obtuse marginal, sequential SVG to PDA and posterolaterla  . Hernia repair      inguinal  . Cataract extraction Right 07-2012    History   Social History  . Marital Status: Married    Spouse Name: N/A    Number of Children: 0  . Years of Education: N/A   Occupational History  . retired Freight forwarder     Social History Main Topics  . Smoking status: Former Smoker    Quit date: 12/16/1976  . Smokeless tobacco: Never Used  . Alcohol  Use: No  . Drug Use: No  . Sexual Activity: Not on file   Other Topics Concern  . Not on file   Social History Narrative   Married , lives w/ wife , no children              Medication List       This list is accurate as of: 05/13/13 11:59 PM.  Always use your most recent med list.               aspirin 81 MG chewable tablet  2 daily.     co-enzyme Q-10 50 MG capsule  Take 300 mg by mouth daily.     enalapril 5 MG tablet  Commonly known as:  VASOTEC  Take 10 mg by mouth daily.     fish oil-omega-3 fatty acids 1000 MG capsule  Take 2 g by mouth daily.     FLAX SEED OIL PO  Take by mouth.     LIPOFLAVONOID Tabs  Take by mouth.     MELATONIN PO  Take 10 mg by mouth.     metoprolol 100 MG tablet  Commonly known as:  LOPRESSOR  TAKE 1 TABLET BY MOUTH TWICE A DAY  niacin 500 MG tablet  Take 1,500 mg by mouth at bedtime.     omeprazole 40 MG capsule  Commonly known as:  PRILOSEC  TAKE ONE CAPSULE EVERY DAY     simvastatin 80 MG tablet  Commonly known as:  ZOCOR  Take 1 tablet (80 mg total) by mouth at bedtime.           Objective:   Physical Exam BP 134/81  Pulse 60  Temp(Src) 98.2 F (36.8 C)  Wt 199 lb (90.266 kg)  SpO2 98% General -- alert, well-developed, NAD.   Lungs -- normal respiratory effort, no intercostal retractions, no accessory muscle use, and normal breath sounds.  Heart-- normal rate, regular rhythm, no murmur.  DIABETIC FEET EXAM: No lower extremity edema Normal pedal pulses bilaterally Skin normal except for + calluses Pinprick examination of the feet -- minimal decrease tip of toes on the R Neurologic--  alert & oriented X3. Speech normal, gait normal, strength normal in all extremities.  Psych-- Cognition and judgment appear intact. Cooperative with normal attention span and concentration. No anxious or depressed appearing.      Assessment & Plan:

## 2013-05-13 NOTE — Assessment & Plan Note (Signed)
BP Well-controlled, labs

## 2013-05-13 NOTE — Assessment & Plan Note (Addendum)
Anxiety, depression well-controlled. Since the last time he was here he is better, decided not to take temazepam He also complaining of "difficulty opening the eyes" he already discussed with his ophthalmology who suggested no further workup

## 2013-05-13 NOTE — Assessment & Plan Note (Addendum)
Diabetes, due for a A1c. has mild neuropathy on exam, feet care  discussed again

## 2013-05-13 NOTE — Patient Instructions (Addendum)
Get your blood work before you leave   Next visit is for a physical exam in 5 months , fasting Please make an appointment

## 2013-05-14 ENCOUNTER — Telehealth: Payer: Self-pay | Admitting: Internal Medicine

## 2013-05-14 NOTE — Telephone Encounter (Signed)
Relevant patient education assigned to patient using Emmi. ° °

## 2013-05-20 ENCOUNTER — Telehealth: Payer: Self-pay

## 2013-05-20 NOTE — Telephone Encounter (Signed)
Relevant patient education assigned to patient using Emmi. ° °

## 2013-06-07 ENCOUNTER — Other Ambulatory Visit: Payer: Self-pay | Admitting: Internal Medicine

## 2013-06-13 ENCOUNTER — Encounter: Payer: Self-pay | Admitting: Internal Medicine

## 2013-06-14 ENCOUNTER — Encounter: Payer: Self-pay | Admitting: Internal Medicine

## 2013-06-15 ENCOUNTER — Encounter: Payer: Self-pay | Admitting: Internal Medicine

## 2013-06-15 ENCOUNTER — Ambulatory Visit (INDEPENDENT_AMBULATORY_CARE_PROVIDER_SITE_OTHER): Payer: Commercial Managed Care - HMO | Admitting: Internal Medicine

## 2013-06-15 VITALS — BP 115/69 | HR 60 | Temp 97.8°F | Wt 203.0 lb

## 2013-06-15 DIAGNOSIS — I10 Essential (primary) hypertension: Secondary | ICD-10-CM

## 2013-06-15 DIAGNOSIS — E1140 Type 2 diabetes mellitus with diabetic neuropathy, unspecified: Secondary | ICD-10-CM

## 2013-06-15 DIAGNOSIS — T148 Other injury of unspecified body region: Secondary | ICD-10-CM

## 2013-06-15 DIAGNOSIS — E1142 Type 2 diabetes mellitus with diabetic polyneuropathy: Secondary | ICD-10-CM

## 2013-06-15 DIAGNOSIS — E1149 Type 2 diabetes mellitus with other diabetic neurological complication: Secondary | ICD-10-CM

## 2013-06-15 DIAGNOSIS — W57XXXA Bitten or stung by nonvenomous insect and other nonvenomous arthropods, initial encounter: Secondary | ICD-10-CM

## 2013-06-15 LAB — BASIC METABOLIC PANEL
BUN: 16 mg/dL (ref 6–23)
CALCIUM: 8.8 mg/dL (ref 8.4–10.5)
CHLORIDE: 105 meq/L (ref 96–112)
CO2: 27 meq/L (ref 19–32)
CREATININE: 1.4 mg/dL (ref 0.4–1.5)
GFR: 52.71 mL/min — ABNORMAL LOW (ref >60.00)
GLUCOSE: 94 mg/dL (ref 70–99)
Potassium: 4.3 mEq/L (ref 3.5–5.1)
Sodium: 140 mEq/L (ref 135–145)

## 2013-06-15 LAB — HEMOGLOBIN A1C: Hgb A1c MFr Bld: 6.6 % — ABNORMAL HIGH (ref 4.6–6.5)

## 2013-06-15 MED ORDER — DOXYCYCLINE HYCLATE 100 MG PO TABS
100.0000 mg | ORAL_TABLET | Freq: Two times a day (BID) | ORAL | Status: DC
Start: 2013-06-15 — End: 2013-06-30

## 2013-06-15 NOTE — Progress Notes (Signed)
Pre visit review using our clinic review tool, if applicable. No additional management support is needed unless otherwise documented below in the visit note. 

## 2013-06-15 NOTE — Progress Notes (Signed)
Subjective:    Patient ID: [name] Little, male    DOB: 1940-08-16, 73 y.o.   MRN: [id number]  DOS:  06/15/2013 Type of  visit: Acute visit 3 weeks ago had a tick bite at the right arm, area got red, swollen. The swelling was sometimes really hard. He is concerned about tickborne diseases. Redness and swelling have now decreased   ROS Denies fever or chills No headaches or fatigue No unusual joint aches or myalgias  Past Medical History  Diagnosis Date  . Anxiety and depression   . Hyperlipidemia   . Hypertension   . Diabetes mellitus   . GERD (gastroesophageal reflux disease)   . PUD (peptic ulcer disease) 7/11    EGD showed few small ulcers, esophagues strecthed  . Headache(784.0) 06/2003    s/p neuro eval ? migraine  . CAD (coronary artery disease)   . Hyperplastic colon polyp 2008  . Iron deficiency     h/o  . Arthritis   . Ischemic cardiomyopathy     EF had 30-35%. The most recent EF was 50% on echo in september 2009  . Allergic rhinitis     Past Surgical History  Procedure Laterality Date  . Coronary artery bypass graft  2004    LIMA to the LADs, sequentail SVG to intermediated and obtuse marginal, sequential SVG to PDA and posterolaterla  . Hernia repair      inguinal  . Cataract extraction Right 07-2012    History   Social History  . Marital Status: Married    Spouse Name: N/A    Number of Children: 0  . Years of Education: N/A   Occupational History  . retired Freight forwarder     Social History Main Topics  . Smoking status: Former Smoker    Quit date: 12/16/1976  . Smokeless tobacco: Never Used  . Alcohol Use: No  . Drug Use: No  . Sexual Activity: Not on file   Other Topics Concern  . Not on file   Social History Narrative   Married , lives w/ wife , no children              Medication List       This list is accurate as of: 06/15/13  5:21 PM.  Always use your most recent med list.               aspirin 81 MG chewable tablet  2  daily.     co-enzyme Q-10 50 MG capsule  Take 300 mg by mouth daily.     doxycycline 100 MG tablet  Commonly known as:  VIBRA-TABS  Take 1 tablet (100 mg total) by mouth 2 (two) times daily.     enalapril 5 MG tablet  Commonly known as:  VASOTEC  Take 10 mg by mouth daily.     fish oil-omega-3 fatty acids 1000 MG capsule  Take 2 g by mouth daily.     FLAX SEED OIL PO  Take by mouth.     LIPOFLAVONOID Tabs  Take by mouth.     MELATONIN PO  Take 10 mg by mouth.     metoprolol 100 MG tablet  Commonly known as:  LOPRESSOR  TAKE 1 TABLET BY MOUTH TWICE A DAY     niacin 500 MG tablet  Take 1,500 mg by mouth at bedtime.     omeprazole 40 MG capsule  Commonly known as:  PRILOSEC  TAKE ONE CAPSULE EVERY DAY     simvastatin  80 MG tablet  Commonly known as:  ZOCOR  Take 1 tablet (80 mg total) by mouth at bedtime.           Objective:   Physical Exam  Musculoskeletal:       Arms:  BP 115/69  Pulse 60  Temp(Src) 97.8 F (36.6 C)  Wt 203 lb (92.08 kg)  SpO2 99% General -- alert, well-developed, NAD.   Psych-- Cognition and judgment appear intact. Cooperative with normal attention span and concentration. No anxious or depressed appearing.        Assessment & Plan:   Tick bite, Redness and induration at the site of a tick bite, will treat as cellulitis with doxycycline which will also cover tick bone disease although there is no much evidence of such.   Hypertension, diabetes, Will obtain labs ordered at the last visit

## 2013-06-15 NOTE — Telephone Encounter (Signed)
Spoke with patient who states that he had a small tick that was attached approximately 3 weeks ago. Post removal the area was erythemic and swollen. He applied cortisone cream to the area. Eventually the area became less swollen and erythemic. As of now the area is smaller but still has erythema with a reported "knot" underneath the skin. Patient scheduled to seen today.

## 2013-06-15 NOTE — Patient Instructions (Addendum)
Get your blood work before you leave   Next visit is for a physical exam by 10-2013 , fasting Please make an appointment

## 2013-06-24 ENCOUNTER — Encounter: Payer: Self-pay | Admitting: Physician Assistant

## 2013-06-24 ENCOUNTER — Ambulatory Visit (INDEPENDENT_AMBULATORY_CARE_PROVIDER_SITE_OTHER): Payer: Commercial Managed Care - HMO | Admitting: Physician Assistant

## 2013-06-24 ENCOUNTER — Other Ambulatory Visit: Payer: Self-pay | Admitting: Internal Medicine

## 2013-06-24 ENCOUNTER — Encounter: Payer: Self-pay | Admitting: Internal Medicine

## 2013-06-24 VITALS — BP 122/78 | HR 59 | Temp 97.9°F | Resp 16 | Ht 70.0 in | Wt 201.8 lb

## 2013-06-24 DIAGNOSIS — E785 Hyperlipidemia, unspecified: Secondary | ICD-10-CM

## 2013-06-24 DIAGNOSIS — L0390 Cellulitis, unspecified: Secondary | ICD-10-CM

## 2013-06-24 DIAGNOSIS — T148 Other injury of unspecified body region: Secondary | ICD-10-CM

## 2013-06-24 DIAGNOSIS — L0291 Cutaneous abscess, unspecified: Secondary | ICD-10-CM

## 2013-06-24 DIAGNOSIS — W57XXXA Bitten or stung by nonvenomous insect and other nonvenomous arthropods, initial encounter: Secondary | ICD-10-CM

## 2013-06-24 MED ORDER — CEPHALEXIN 500 MG PO CAPS: 500.0000 mg | ORAL_CAPSULE | Freq: Four times a day (QID) | ORAL | Status: DC

## 2013-06-24 NOTE — Assessment & Plan Note (Signed)
Improved from last visit. Rx Keflex.  Apply cool compresses.  Claritin daily.  Will obtain workup for rickettsial infection.

## 2013-06-24 NOTE — Telephone Encounter (Signed)
Refill for zocor sent to [facility] in [facility]

## 2013-06-24 NOTE — Progress Notes (Signed)
Patient presents to clinic today c/o redness and itching at site of tick bite. Tick bite first noticed about 1 month ago.  Was able to remove tick from site.  Patient was evaluated by his PCP 1 week ago.  Was treated with Doxycycline.  Patient endorses improvement in redness and swelling, but some lingering of symptoms noted.  Denies fever, chills, aches, rash of palms and soles, bulls-eye rash or neurological deficit.    Past Medical History  Diagnosis Date  . Anxiety and depression   . Hyperlipidemia   . Hypertension   . Diabetes mellitus   . GERD (gastroesophageal reflux disease)   . PUD (peptic ulcer disease) 7/11    EGD showed few small ulcers, esophagues strecthed  . Headache(784.0) 06/2003    s/p neuro eval ? migraine  . CAD (coronary artery disease)   . Hyperplastic colon polyp 2008  . Iron deficiency     h/o  . Arthritis   . Ischemic cardiomyopathy     EF had 30-35%. The most recent EF was 50% on echo in september 2009  . Allergic rhinitis     Current Outpatient Prescriptions on File Prior to Visit  Medication Sig Dispense Refill  . aspirin 81 MG chewable tablet 2 daily.       Marland Kitchen co-enzyme Q-10 50 MG capsule Take 300 mg by mouth daily.       Marland Kitchen doxycycline (VIBRA-TABS) 100 MG tablet Take 1 tablet (100 mg total) by mouth 2 (two) times daily.  20 tablet  0  . enalapril (VASOTEC) 5 MG tablet Take 10 mg by mouth daily.      . fish oil-omega-3 fatty acids 1000 MG capsule Take 2 g by mouth daily.        . Flaxseed, Linseed, (FLAX SEED OIL PO) Take by mouth.       . MELATONIN PO Take 10 mg by mouth.      . metoprolol (LOPRESSOR) 100 MG tablet TAKE 1 TABLET BY MOUTH TWICE A DAY  60 tablet  6  . niacin 500 MG tablet Take 1,500 mg by mouth at bedtime.       Marland Kitchen omeprazole (PRILOSEC) 40 MG capsule TAKE ONE CAPSULE EVERY DAY  30 capsule  6  . simvastatin (ZOCOR) 80 MG tablet TAKE 1 TABLET AT BEDTIME  90 tablet  1  . Vitamins-Lipotropics (LIPOFLAVONOID) TABS Take by mouth.       No  current facility-administered medications on file prior to visit.    Allergies  Allergen Reactions  . Telmisartan     REACTION: diarrhea    Family History  Problem Relation Age of Onset  . Diabetes Father   . Colon cancer Neg Hx   . Prostate cancer Neg Hx   . Heart attack Mother   . Heart attack Father     History   Social History  . Marital Status: Married    Spouse Name: N/A    Number of Children: 0  . Years of Education: N/A   Occupational History  . retired Freight forwarder     Social History Main Topics  . Smoking status: Former Smoker    Quit date: 12/16/1976  . Smokeless tobacco: Never Used  . Alcohol Use: No  . Drug Use: No  . Sexual Activity: None   Other Topics Concern  . None   Social History Narrative   Married , lives w/ wife , no children         Review of Systems -  See HPI.  All other ROS are negative.  BP 122/78  Pulse 59  Temp(Src) 97.9 F (36.6 C) (Oral)  Resp 16  Ht 5\' 10"  (1.778 m)  Wt 201 lb 12 oz (91.513 kg)  BMI 28.95 kg/m2  SpO2 98%  Physical Exam  Vitals reviewed. Constitutional: He is oriented to person, place, and time and well-developed, well-nourished, and in no distress.  HENT:  Head: Normocephalic and atraumatic.  Cardiovascular: Normal rate, regular rhythm, normal heart sounds and intact distal pulses.   Pulmonary/Chest: Effort normal and breath sounds normal. No respiratory distress. He has no wheezes. He has no rales. He exhibits no tenderness.  Neurological: He is alert and oriented to person, place, and time.  Skin: Skin is warm and dry. No rash noted.     Psychiatric: Affect normal.    Recent Results (from the past 2160 hour(s))  BASIC METABOLIC PANEL     Status: Abnormal   Collection Time    06/15/13 12:12 PM      Result Value Ref Range   Sodium 140  135 - 145 mEq/L   Potassium 4.3  3.5 - 5.1 mEq/L   Chloride 105  96 - 112 mEq/L   CO2 27  19 - 32 mEq/L   Glucose, Bld 94  70 - 99 mg/dL   BUN 16  6 - 23 mg/dL     Creatinine, Ser 1.4  0.4 - 1.5 mg/dL   Calcium 8.8  8.4 - 10.5 mg/dL   GFR 52.71 (*) >60.00 mL/min  HEMOGLOBIN A1C     Status: Abnormal   Collection Time    06/15/13 12:12 PM      Result Value Ref Range   Hemoglobin A1C 6.6 (*) 4.6 - 6.5 %   Comment: Glycemic Control Guidelines for People with Diabetes:Non Diabetic:  <6%Goal of Therapy: <7%Additional Action Suggested:  >8%     Assessment/Plan: Tick bite Improved from last visit. Rx Keflex.  Apply cool compresses.  Claritin daily.  Will obtain workup for rickettsial infection.

## 2013-06-24 NOTE — Telephone Encounter (Signed)
Called pt and advised that the side effects should subside. Patient stated he is concerned that the area on his hand is still raised and has a hard bump. He requests an OV for further evaluation, appt made today with Elyn Aquas, PA at [facility]

## 2013-06-24 NOTE — Progress Notes (Signed)
Pre visit review using our clinic review tool, if applicable. No additional management support is needed unless otherwise documented below in the visit note/SLS  

## 2013-06-24 NOTE — Patient Instructions (Signed)
Please take antibiotic as directed.  Keep using the cortisone cream.  Apply cool compresses to the area.  Take a daily claritin.  I will call you with your lab results.  We will change therapy if indicated by results.  If symptoms persist and lab work is unremarkable, we may set you up with a specialist.

## 2013-06-25 LAB — B. BURGDORFI ANTIBODIES: B burgdorferi Ab IgG+IgM: 0.24 {ISR}

## 2013-06-28 LAB — ROCKY MTN SPOTTED FVR ABS PNL(IGG+IGM)
RMSF IGG: 0.68 IV
RMSF IGM: 1.21 IV — AB

## 2013-06-30 ENCOUNTER — Ambulatory Visit (INDEPENDENT_AMBULATORY_CARE_PROVIDER_SITE_OTHER): Payer: Commercial Managed Care - HMO | Admitting: Internal Medicine

## 2013-06-30 ENCOUNTER — Encounter: Payer: Self-pay | Admitting: Internal Medicine

## 2013-06-30 VITALS — BP 135/85 | HR 60 | Temp 97.8°F | Wt 204.0 lb

## 2013-06-30 DIAGNOSIS — T148 Other injury of unspecified body region: Secondary | ICD-10-CM

## 2013-06-30 DIAGNOSIS — W57XXXA Bitten or stung by nonvenomous insect and other nonvenomous arthropods, initial encounter: Secondary | ICD-10-CM

## 2013-06-30 MED ORDER — HYDROCORTISONE 2.5 % EX CREA: TOPICAL_CREAM | Freq: Two times a day (BID) | CUTANEOUS | Status: DC

## 2013-06-30 NOTE — Assessment & Plan Note (Signed)
  Tick bit, + serology for RMSF. Clinically asymptomatic, status post doxycycline by mouth Recommend observation Rx Itching at the site of the bite with hydrocortisone

## 2013-06-30 NOTE — Patient Instructions (Signed)
Use the cream twice a day

## 2013-06-30 NOTE — Progress Notes (Signed)
Subjective:    Patient ID: [name] Little, male    DOB: 1940/10/13, 73 y.o.   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 The patient is status post doxycycline He also had persistent cellulitis and is currently almost done taking a course of keflex. He feels well.    ROS Denies fever chills No nausea or vomiting No generalized rash, redness at the site of the bite is still itching. No joint aches or headaches  Past Medical History  Diagnosis Date  . Anxiety and depression   . Hyperlipidemia   . Hypertension   . Diabetes mellitus   . GERD (gastroesophageal reflux disease)   . PUD (peptic ulcer disease) 7/11    EGD showed few small ulcers, esophagues strecthed  . Headache(784.0) 06/2003    s/p neuro eval ? migraine  . CAD (coronary artery disease)   . Hyperplastic colon polyp 2008  . Iron deficiency     h/o  . Arthritis   . Ischemic cardiomyopathy     EF had 30-35%. The most recent EF was 50% on echo in september 2009  . Allergic rhinitis     Past Surgical History  Procedure Laterality Date  . Coronary artery bypass graft  2004    LIMA to the LADs, sequentail SVG to intermediated and obtuse marginal, sequential SVG to PDA and posterolaterla  . Hernia repair      inguinal  . Cataract extraction Right 07-2012    History   Social History  . Marital Status: Married    Spouse Name: N/A    Number of Children: 0  . Years of Education: N/A   Occupational History  . retired Freight forwarder     Social History Main Topics  . Smoking status: Former Smoker    Quit date: 12/16/1976  . Smokeless tobacco: Never Used  . Alcohol Use: No  . Drug Use: No  . Sexual Activity: Not on file   Other Topics Concern  . Not on file   Social History Narrative   Married , lives w/ wife , no children              Medication List       This list is accurate as of: 06/30/13  6:26  PM.  Always use your most recent med list.               aspirin 81 MG chewable tablet  2 daily.     cephALEXin 500 MG capsule  Commonly known as:  KEFLEX  Take 1 capsule (500 mg total) by mouth 4 (four) times daily.     co-enzyme Q-10 50 MG capsule  Take 300 mg by mouth daily.     enalapril 5 MG tablet  Commonly known as:  VASOTEC  Take 10 mg by mouth daily.     fish oil-omega-3 fatty acids 1000 MG capsule  Take 2 g by mouth daily.     FLAX SEED OIL PO  Take by mouth.     hydrocortisone 2.5 % cream  Apply topically 2 (two) times daily.     MELATONIN PO  Take 10 mg by mouth.     metoprolol 100 MG tablet  Commonly known as:  LOPRESSOR  TAKE 1 TABLET BY MOUTH TWICE A DAY     niacin 500 MG tablet  Take 1,500 mg by mouth at bedtime.  omeprazole 40 MG capsule  Commonly known as:  PRILOSEC  TAKE ONE CAPSULE EVERY DAY     simvastatin 80 MG tablet  Commonly known as:  ZOCOR  TAKE 1 TABLET AT BEDTIME           Objective:   Physical Exam BP 135/85  Pulse 60  Temp(Src) 97.8 F (36.6 C)  Wt 204 lb (92.534 kg)  SpO2 99% General -- alert, well-developed, NAD.   Extremities-- no pretibial edema bilaterally  R arm-- area of bite with some postinflammatory hyperpigmentation and induration. No redness or swelling to Neurologic--  alert & oriented X3. Speech normal, gait normal, strength normal in all extremities.  Psych-- Cognition and judgment appear intact. Cooperative with normal attention span and concentration. No anxious or depressed appearing.     Assessment & Plan:

## 2013-06-30 NOTE — Progress Notes (Signed)
Pre visit review using our clinic review tool, if applicable. No additional management support is needed unless otherwise documented below in the visit note. 

## 2013-10-06 ENCOUNTER — Other Ambulatory Visit (HOSPITAL_COMMUNITY): Payer: Self-pay | Admitting: *Deleted

## 2013-10-06 DIAGNOSIS — I6529 Occlusion and stenosis of unspecified carotid artery: Secondary | ICD-10-CM

## 2013-10-13 ENCOUNTER — Ambulatory Visit (HOSPITAL_COMMUNITY): Payer: Medicare HMO | Attending: Cardiovascular Disease | Admitting: Cardiology

## 2013-10-13 DIAGNOSIS — I10 Essential (primary) hypertension: Secondary | ICD-10-CM | POA: Insufficient documentation

## 2013-10-13 DIAGNOSIS — E119 Type 2 diabetes mellitus without complications: Secondary | ICD-10-CM | POA: Diagnosis not present

## 2013-10-13 DIAGNOSIS — I2510 Atherosclerotic heart disease of native coronary artery without angina pectoris: Secondary | ICD-10-CM | POA: Diagnosis not present

## 2013-10-13 DIAGNOSIS — I6529 Occlusion and stenosis of unspecified carotid artery: Secondary | ICD-10-CM | POA: Insufficient documentation

## 2013-10-13 DIAGNOSIS — Z87891 Personal history of nicotine dependence: Secondary | ICD-10-CM | POA: Insufficient documentation

## 2013-10-13 NOTE — Progress Notes (Signed)
Carotid duplex performed 

## 2013-10-14 ENCOUNTER — Other Ambulatory Visit: Payer: Self-pay | Admitting: Internal Medicine

## 2013-10-14 DIAGNOSIS — E042 Nontoxic multinodular goiter: Secondary | ICD-10-CM

## 2013-10-18 ENCOUNTER — Ambulatory Visit (HOSPITAL_BASED_OUTPATIENT_CLINIC_OR_DEPARTMENT_OTHER)
Admission: RE | Admit: 2013-10-18 | Discharge: 2013-10-18 | Disposition: A | Discharge status: Home / Self Care | Payer: Medicare HMO | Source: Ambulatory Visit | Attending: Internal Medicine | Admitting: Internal Medicine

## 2013-10-18 ENCOUNTER — Encounter: Payer: Self-pay | Admitting: Internal Medicine

## 2013-10-18 ENCOUNTER — Ambulatory Visit (INDEPENDENT_AMBULATORY_CARE_PROVIDER_SITE_OTHER): Payer: Medicare HMO | Admitting: Internal Medicine

## 2013-10-18 VITALS — BP 146/74 | HR 54 | Temp 97.8°F | Ht 71.0 in | Wt 202.0 lb

## 2013-10-18 DIAGNOSIS — I2510 Atherosclerotic heart disease of native coronary artery without angina pectoris: Secondary | ICD-10-CM

## 2013-10-18 DIAGNOSIS — E1149 Type 2 diabetes mellitus with other diabetic neurological complication: Secondary | ICD-10-CM

## 2013-10-18 DIAGNOSIS — J320 Chronic maxillary sinusitis: Secondary | ICD-10-CM

## 2013-10-18 DIAGNOSIS — I10 Essential (primary) hypertension: Secondary | ICD-10-CM

## 2013-10-18 DIAGNOSIS — E042 Nontoxic multinodular goiter: Secondary | ICD-10-CM | POA: Diagnosis not present

## 2013-10-18 DIAGNOSIS — E785 Hyperlipidemia, unspecified: Secondary | ICD-10-CM

## 2013-10-18 DIAGNOSIS — I739 Peripheral vascular disease, unspecified: Secondary | ICD-10-CM

## 2013-10-18 DIAGNOSIS — Z0000 Encounter for general adult medical examination without abnormal findings: Secondary | ICD-10-CM

## 2013-10-18 DIAGNOSIS — E1140 Type 2 diabetes mellitus with diabetic neuropathy, unspecified: Secondary | ICD-10-CM

## 2013-10-18 DIAGNOSIS — E1142 Type 2 diabetes mellitus with diabetic polyneuropathy: Secondary | ICD-10-CM

## 2013-10-18 DIAGNOSIS — Z23 Encounter for immunization: Secondary | ICD-10-CM

## 2013-10-18 DIAGNOSIS — I779 Disorder of arteries and arterioles, unspecified: Secondary | ICD-10-CM | POA: Insufficient documentation

## 2013-10-18 LAB — BASIC METABOLIC PANEL
BUN: 24 mg/dL — ABNORMAL HIGH (ref 6–23)
CO2: 29 mEq/L (ref 19–32)
CREATININE: 1.5 mg/dL (ref 0.4–1.5)
Calcium: 8.9 mg/dL (ref 8.4–10.5)
Chloride: 102 mEq/L (ref 96–112)
GFR: 48.63 mL/min — ABNORMAL LOW (ref >60.00)
Glucose, Bld: 94 mg/dL (ref 70–99)
Potassium: 4.7 mEq/L (ref 3.5–5.1)
Sodium: 138 mEq/L (ref 135–145)

## 2013-10-18 LAB — CBC WITH DIFFERENTIAL/PLATELET
Basophils Absolute: 0.1 10*3/uL (ref 0.0–0.1)
Basophils Relative: 0.7 % (ref 0.0–3.0)
Eosinophils Absolute: 2 10*3/uL — ABNORMAL HIGH (ref 0.0–0.7)
HCT: 41.1 % (ref 39.0–52.0)
Hemoglobin: 13.6 g/dL (ref 13.0–17.0)
Lymphocytes Relative: 18.4 % (ref 12.0–46.0)
Lymphs Abs: 1.6 10*3/uL (ref 0.7–4.0)
MCHC: 33 g/dL (ref 30.0–36.0)
MCV: 84.7 fl (ref 78.0–100.0)
MONOS PCT: 7.4 % (ref 3.0–12.0)
Monocytes Absolute: 0.6 10*3/uL (ref 0.1–1.0)
NEUTROS ABS: 4.4 10*3/uL (ref 1.4–7.7)
Neutrophils Relative %: 50.6 % (ref 43.0–77.0)
Platelets: 168 10*3/uL (ref 150.0–400.0)
RBC: 4.86 Mil/uL (ref 4.22–5.81)
RDW: 15.7 % — ABNORMAL HIGH (ref 11.5–15.5)
WBC: 8.8 10*3/uL (ref 4.0–10.5)

## 2013-10-18 LAB — HEMOGLOBIN A1C: HEMOGLOBIN A1C: 6.6 % — AB (ref 4.6–6.5)

## 2013-10-18 LAB — LIPID PANEL
Cholesterol: 110 mg/dL (ref 0–200)
HDL: 24.2 mg/dL — ABNORMAL LOW (ref >39.00)
LDL Cholesterol: 67 mg/dL (ref 0–99)
NONHDL: 85.8
Total CHOL/HDL Ratio: 5
Triglycerides: 94 mg/dL (ref 0.0–149.0)
VLDL: 18.8 mg/dL (ref 0.0–40.0)

## 2013-10-18 LAB — ALT: ALT: 27 U/L (ref 0–53)

## 2013-10-18 LAB — AST: AST: 23 U/L (ref 0–37)

## 2013-10-18 NOTE — Patient Instructions (Signed)
Get your blood work before you leave   Next visit is for routine check up regards your blood sugar , blood pressure in 6 months  No need to come back fasting Please make an appointment     Fall Prevention and [place] cause injuries and can affect all age groups. It is possible to use preventive measures to significantly decrease the likelihood of falls. There are many simple measures which can make your home safer and prevent falls. OUTDOORS  Repair cracks and edges of walkways and driveways.  Remove high doorway thresholds.  Trim shrubbery on the main path into your home.  Have good outside lighting.  Clear walkways of tools, rocks, debris, and clutter.  Check that handrails are not broken and are securely fastened. Both sides of steps should have handrails.  Have leaves, snow, and ice cleared regularly.  Use sand or salt on walkways during winter months.  In the garage, clean up grease or oil spills. BATHROOM  Install night lights.  Install grab bars by the toilet and in the tub and shower.  Use non-skid mats or decals in the tub or shower.  Place a plastic non-slip stool in the shower to sit on, if needed.  Keep floors dry and clean up all water on the floor immediately.  Remove soap buildup in the tub or shower on a regular basis.  Secure bath mats with non-slip, double-sided rug tape.  Remove throw rugs and tripping hazards from the floors. BEDROOMS  Install night lights.  Make sure a bedside light is easy to reach.  Do not use oversized bedding.  Keep a telephone by your bedside.  Have a firm chair with side arms to use for getting dressed.  Remove throw rugs and tripping hazards from the floor. KITCHEN  Keep handles on pots and pans turned toward the center of the stove. Use back burners when possible.  Clean up spills quickly and allow time for drying.  Avoid walking on wet floors.  Avoid hot utensils and knives.  Position shelves  so they are not too high or low.  Place commonly used objects within easy reach.  If necessary, use a sturdy step stool with a grab bar when reaching.  Keep electrical cables out of the way.  Do not use floor polish or wax that makes floors slippery. If you must use wax, use non-skid floor wax.  Remove throw rugs and tripping hazards from the floor. STAIRWAYS  Never leave objects on stairs.  Place handrails on both sides of stairways and use them. Fix any loose handrails. Make sure handrails on both sides of the stairways are as long as the stairs.  Check carpeting to make sure it is firmly attached along stairs. Make repairs to worn or loose carpet promptly.  Avoid placing throw rugs at the top or bottom of stairways, or properly secure the rug with carpet tape to prevent slippage. Get rid of throw rugs, if possible.  Have an electrician put in a light switch at the top and bottom of the stairs. OTHER FALL PREVENTION TIPS  Wear low-heel or rubber-soled shoes that are supportive and fit well. Wear closed toe shoes.  When using a stepladder, make sure it is fully opened and both spreaders are firmly locked. Do not climb a closed stepladder.  Add color or contrast paint or tape to grab bars and handrails in your home. Place contrasting color strips on first and last steps.  Learn and use mobility aids  as needed. Install an electrical emergency response system.  Turn on lights to avoid dark areas. Replace light bulbs that burn out immediately. Get light switches that glow.  Arrange furniture to create clear pathways. Keep furniture in the same place.  Firmly attach carpet with non-skid or double-sided tape.  Eliminate uneven floor surfaces.  Select a carpet pattern that does not visually hide the edge of steps.  Be aware of all pets. OTHER HOME SAFETY TIPS  Set the water temperature for 120 F (48.8 C).  Keep emergency numbers on or near the telephone.  Keep smoke  detectors on every level of the home and near sleeping areas. Document Released: 02/08/2002 Document Revised: 08/20/2011 Document Reviewed: 05/10/2011 [facility] Patient Information 2015 [place], [place]. This information is not intended to replace advice given to you by your health care provider. Make sure you discuss any questions you have with your health care provider.

## 2013-10-18 NOTE — Assessment & Plan Note (Signed)
Continue with coQ10, flaxseed oil, zocor. Labs

## 2013-10-18 NOTE — Progress Notes (Signed)
Subjective:    Patient ID: [name] Little, male    DOB: Jul 29, 1940, 73 y.o.   MRN: [id number]  DOS:  10/18/2013 Type of visit - description:   Here for Medicare AWV:   1. Risk factors based on Past M, S, F history: reviewed   2. Physical Activities: still does some yard work, home chores   3. Depression/mood: Neg screen 4. Hearing: No problems w/ normal conversation; has B tinnitus steady , at some point was seen by audiologist, was offered an aid.   5. ADL's: Independent , still drives   6. Fall Risk:, no recent falls,  precautions discussed ,   7. Home Safety: does feel safe at home   8. Height, weight, &visual acuity: see VS, s/p cataract surgery, last eye check 6 months ago, doing great  9. Counseling: provided   10. Labs ordered based on risk factors: if needed   11. Referral Coordination: if needed   12. Care Plan, see assessment and plan   13. Cognitive Assessment: Motor skills and cognition appropriate for age   In addition, today we discussed the following: High cholesterol, good compliance of medication. Hypertension, ambulatory BPs around 150/75, on Vasotec. Lopressor. Continue with a ill defined L side facial pressure    ROS  No  CP, SOB No palpitations Denies  nausea, vomiting; had  Diarrhea last week, no blood in the stools occ  cough, no sputum production (-) wheezing, chest congestion No dysuria, gross hematuria, difficulty urinating ; occ  nocturia x years  Denies diplopia, slurred speech, motor deficits   Past Medical History  Diagnosis Date  . Anxiety and depression   . Hyperlipidemia   . Hypertension   . Diabetes mellitus   . GERD (gastroesophageal reflux disease)   . PUD (peptic ulcer disease) 7/11    EGD showed few small ulcers, esophagues strecthed  . Headache(784.0) 06/2003    s/p neuro eval ? migraine  . CAD (coronary artery disease)   . Hyperplastic colon polyp 2008  . Iron deficiency     h/o  . Arthritis   . Ischemic cardiomyopathy    EF had 30-35%. The most recent EF was 50% on echo in september 2009  . Allergic rhinitis     Past Surgical History  Procedure Laterality Date  . Coronary artery bypass graft  2004    LIMA to the LADs, sequentail SVG to intermediated and obtuse marginal, sequential SVG to PDA and posterolaterla  . Hernia repair      inguinal  . Cataract extraction Right 07-2012    History   Social History  . Marital Status: Married    Spouse Name: N/A    Number of Children: 0  . Years of Education: N/A   Occupational History  . retired Freight forwarder     Social History Main Topics  . Smoking status: Former Smoker    Quit date: 12/16/1976  . Smokeless tobacco: Never Used  . Alcohol Use: No  . Drug Use: No  . Sexual Activity: Not on file   Other Topics Concern  . Not on file   Social History Narrative   Married , lives w/ wife , no children           Family History  Problem Relation Age of Onset  . Diabetes Father   . Colon cancer Neg Hx   . Prostate cancer Neg Hx   . Heart attack Mother   . Heart attack Father  Medication List       This list is accurate as of: 10/18/13  6:36 PM.  Always use your most recent med list.               aspirin 81 MG chewable tablet  2 daily.     co-enzyme Q-10 50 MG capsule  Take 300 mg by mouth daily.     enalapril 5 MG tablet  Commonly known as:  VASOTEC  Take 10 mg by mouth daily.     fish oil-omega-3 fatty acids 1000 MG capsule  Take 2 g by mouth daily.     FLAX SEED OIL PO  Take by mouth.     hydrocortisone 2.5 % cream  Apply topically 2 (two) times daily.     metoprolol 100 MG tablet  Commonly known as:  LOPRESSOR  TAKE 1 TABLET BY MOUTH TWICE A DAY     niacin 500 MG tablet  Take 1,500 mg by mouth at bedtime.     omeprazole 40 MG capsule  Commonly known as:  PRILOSEC  TAKE ONE CAPSULE EVERY DAY     simvastatin 80 MG tablet  Commonly known as:  ZOCOR  TAKE 1 TABLET AT BEDTIME           Objective:   Physical  Exam BP 146/74  Pulse 54  Temp(Src) 97.8 F (36.6 C) (Oral)  Ht 5\' 11"  (1.803 m)  Wt 202 lb (91.627 kg)  BMI 28.19 kg/m2  SpO2 95% General -- alert, well-developed, NAD.  Neck --no thyromegaly , normal carotid pulse, no bruit  HEENT-- Not pale. Lungs -- normal respiratory effort, no intercostal retractions, no accessory muscle use, and normal breath sounds.  Heart-- normal rate, regular rhythm, no murmur.  Abdomen-- Not distended, good bowel sounds,soft, non-tender. No bruit  Extremities-- +/+++ pretibial edema bilaterally  Neurologic--  alert & oriented X3. Speech normal  Psych-- Cognition and judgment appear intact. Cooperative with normal attention span and concentration. No anxious or depressed appearing.       Assessment & Plan:    Abnormal thyroid  ultrasound (per carotid ultrasound)-- dedicated ultrasound of thyroid pending

## 2013-10-18 NOTE — Assessment & Plan Note (Signed)
Asymptomatic, continue with ACE inhibitors, simvastatin, aspirin and beta blockers

## 2013-10-18 NOTE — Assessment & Plan Note (Signed)
Continue with the ill-defined left facial pressure, stable for years. Recommend observation for now

## 2013-10-18 NOTE — Assessment & Plan Note (Signed)
Controlled, Continue Vasotec and metoprolol

## 2013-10-18 NOTE — Progress Notes (Signed)
Pre-visit discussion using our clinic review tool. No additional management support is needed unless otherwise documented below in the visit note.  

## 2013-10-18 NOTE — Assessment & Plan Note (Addendum)
Td 09  pneumonia shot 2005 a, 09-2011  prevnar Shingles Shot-- prescription provided before, did not get it  ($)  colonoscopy 03/2006, 2 hyperplastic polyps, Next 10 years  PSAs have been consistently normal, DRE normal 2014, next prostate ca screening next year if appropriate   Exercise And diet discussed.

## 2013-10-18 NOTE — Assessment & Plan Note (Signed)
Last u/s 10-2013 (see below), next 6 months Stable 1-39% RICA stenosis. LICA has increased from prior exam, now in the 60-79% range. Normal subclavian arteries, bilaterally. Patent vertebral arteries with antegrade flow.

## 2013-10-18 NOTE — Assessment & Plan Note (Signed)
Due for labs

## 2013-10-19 ENCOUNTER — Telehealth: Payer: Self-pay | Admitting: Cardiology

## 2013-10-19 ENCOUNTER — Telehealth: Payer: Self-pay | Admitting: Internal Medicine

## 2013-10-19 DIAGNOSIS — E041 Nontoxic single thyroid nodule: Secondary | ICD-10-CM

## 2013-10-19 NOTE — Telephone Encounter (Signed)
It has increased but can still be followed as suggested.

## 2013-10-19 NOTE — Telephone Encounter (Signed)
Had a carotid doppler done 8/12 ordered by Dr. Larose Kells.  He received the results and the % of plaque has increased.  Would like Dr. Percival Spanish to review and advise.  He is concerned because he does have some numbess on the side of his face and the "internet" says this is an indicator some plaque can break off and cause a stroke. Told patient I will send this message to Dr. Percival Spanish for advise.  Patient voiced understanding.

## 2013-10-19 NOTE — Telephone Encounter (Signed)
Pt called in stating that he had an echo done last Wednsday and he wanted Dr. Percival Spanish to look at it for him. Please call  Thanks

## 2013-10-19 NOTE — Telephone Encounter (Signed)
Caller name: Jerson  Relation to pt: self  Call back number: [phone]   Reason for call: inquiring about ultrasound results

## 2013-10-19 NOTE — Telephone Encounter (Signed)
Called patient and informed him of Dr. Rosezella [place] recommendations.

## 2013-10-19 NOTE — Telephone Encounter (Signed)
Pt had ultrasound done yesterday (10/18/2013) and is calling to get results.    Please Advise.

## 2013-10-19 NOTE — Telephone Encounter (Signed)
spoke w/  Patient, has several thyroid nodules. There is one on the left side that is the largest and  needs to be biopsied. Order entered Otherwise repeat ultrasound in 6-12 months

## 2013-10-27 ENCOUNTER — Other Ambulatory Visit (HOSPITAL_COMMUNITY)
Admission: RE | Admit: 2013-10-27 | Discharge: 2013-10-27 | Disposition: A | Discharge status: Home / Self Care | Payer: Medicare HMO | Source: Ambulatory Visit | Attending: Interventional Radiology | Admitting: Interventional Radiology

## 2013-10-27 ENCOUNTER — Ambulatory Visit
Admission: RE | Admit: 2013-10-27 | Discharge: 2013-10-27 | Disposition: A | Discharge status: Home / Self Care | Payer: Commercial Managed Care - HMO | Source: Ambulatory Visit | Attending: Internal Medicine | Admitting: Internal Medicine

## 2013-10-27 DIAGNOSIS — E041 Nontoxic single thyroid nodule: Secondary | ICD-10-CM | POA: Insufficient documentation

## 2013-11-15 ENCOUNTER — Telehealth: Payer: Self-pay | Admitting: *Deleted

## 2013-11-15 NOTE — Telephone Encounter (Signed)
VM left for pt in regards to DB f/u for a BP check  

## 2013-11-17 ENCOUNTER — Telehealth: Payer: Self-pay | Admitting: Internal Medicine

## 2013-11-17 DIAGNOSIS — E119 Type 2 diabetes mellitus without complications: Secondary | ICD-10-CM

## 2013-11-17 NOTE — Telephone Encounter (Signed)
Please advise 

## 2013-11-17 NOTE — Telephone Encounter (Signed)
Caller name: Othel Relation to pt: self  Call back number: [phone] Pharmacy:  Reason for call:   Patient states that he needs a referral to see his eye doctor-Dr. Katha Cabal at [facility]: [phone]. He has humana. Patient says for [facility] to call him to schedule this appointment.

## 2013-11-17 NOTE — Telephone Encounter (Signed)
Diagnosis is diabetes. Please arrange

## 2013-11-17 NOTE — Telephone Encounter (Signed)
Please provide dx code

## 2013-11-18 NOTE — Telephone Encounter (Signed)
Referral placed to Marica Otter at [facility].

## 2013-12-07 ENCOUNTER — Encounter: Payer: Self-pay | Admitting: Internal Medicine

## 2013-12-07 ENCOUNTER — Ambulatory Visit (INDEPENDENT_AMBULATORY_CARE_PROVIDER_SITE_OTHER): Payer: Commercial Managed Care - HMO | Admitting: Internal Medicine

## 2013-12-07 VITALS — BP 146/83 | HR 51 | Temp 96.9°F | Wt 203.1 lb

## 2013-12-07 DIAGNOSIS — H53143 Visual discomfort, bilateral: Secondary | ICD-10-CM

## 2013-12-07 DIAGNOSIS — H5310 Unspecified subjective visual disturbances: Secondary | ICD-10-CM | POA: Insufficient documentation

## 2013-12-07 DIAGNOSIS — L989 Disorder of the skin and subcutaneous tissue, unspecified: Secondary | ICD-10-CM

## 2013-12-07 NOTE — Assessment & Plan Note (Signed)
Eye lid disorder, Complained of fatigue of the eyelids bilaterally, no dysphagia or diplopia. Myasthenia gravis?. Exam is normal today, we discussed taking a acetil- choline receptor antibody But for now we agreed on observation

## 2013-12-07 NOTE — Progress Notes (Signed)
Subjective:    Patient ID: [name] Little, male    DOB: 07-22-1940, 73 y.o.   MRN: [id number]  DOS:  12/07/2013 Type of visit - description : acute Interval history: Has a skin lesion at the right neck that keeps bothering him, likes it excised. We also reviewed his medications, good medication compliance. Reviewed  labs, not due for any blood work. Also continue him his eyelids are very tired mostly when he tries to rest    ROS Denies diplopia or difficulty swallowing. No chest pain or difficulty breathing  Past Medical History  Diagnosis Date  . Anxiety and depression   . Hyperlipidemia   . Hypertension   . Diabetes mellitus   . GERD (gastroesophageal reflux disease)   . PUD (peptic ulcer disease) 7/11    EGD showed few small ulcers, esophagues strecthed  . Headache(784.0) 06/2003    s/p neuro eval ? migraine  . CAD (coronary artery disease)   . Hyperplastic colon polyp 2008  . Iron deficiency     h/o  . Arthritis   . Ischemic cardiomyopathy     EF had 30-35%. The most recent EF was 50% on echo in september 2009  . Allergic rhinitis     Past Surgical History  Procedure Laterality Date  . Coronary artery bypass graft  2004    LIMA to the LADs, sequentail SVG to intermediated and obtuse marginal, sequential SVG to PDA and posterolaterla  . Hernia repair      inguinal  . Cataract extraction Right 07-2012    History   Social History  . Marital Status: Married    Spouse Name: N/A    Number of Children: 0  . Years of Education: N/A   Occupational History  . retired Freight forwarder     Social History Main Topics  . Smoking status: Former Smoker    Quit date: 12/16/1976  . Smokeless tobacco: Never Used  . Alcohol Use: No  . Drug Use: No  . Sexual Activity: Not on file   Other Topics Concern  . Not on file   Social History Narrative   Married , lives w/ wife , no children              Medication List       This list is accurate as of: 12/07/13  9:12 AM.   Always use your most recent med list.               aspirin 81 MG chewable tablet  2 daily.     co-enzyme Q-10 50 MG capsule  Take 300 mg by mouth daily.     enalapril 5 MG tablet  Commonly known as:  VASOTEC  Take 10 mg by mouth daily.     fish oil-omega-3 fatty acids 1000 MG capsule  Take 2 g by mouth daily.     FLAX SEED OIL PO  Take by mouth.     metoprolol 100 MG tablet  Commonly known as:  LOPRESSOR  TAKE 1 TABLET BY MOUTH TWICE A DAY     niacin 500 MG tablet  Take 1,500 mg by mouth at bedtime.     omeprazole 40 MG capsule  Commonly known as:  PRILOSEC  TAKE ONE CAPSULE EVERY DAY     simvastatin 80 MG tablet  Commonly known as:  ZOCOR  TAKE 1 TABLET AT BEDTIME           Objective:   Physical Exam  Neck:  BP 146/83  Pulse 51  Temp(Src) 96.9 F (36.1 C) (Axillary)  Wt 203 lb 2 oz (92.137 kg)  SpO2 99% General -- alert, well-developed, NAD.   Extremities-- no pretibial edema bilaterally  Neurologic--  alert & oriented X3. Speech normal, gait appropriate for age, strength symmetric and appropriate for age.  Eye lids wnl EOMI, PERLA   Psych-- Cognition and judgment appear intact. Cooperative with normal attention span and concentration. No anxious or depressed appearing.        Assessment & Plan:     Skin lesion, refer to dermatology for consideration of excision

## 2013-12-07 NOTE — Patient Instructions (Signed)
  Please come back to the office by 04-2014 for a routine check up     REMINDERS It is extremely important to know what medications you take, please bring all bottles with you (or an accurate medication list) for every visit  If we are ordering labs, XRs or referring you to a specialist : we will always communicate to you the results or the time of the appointment within few days. If you don't hear from [place] please call the office

## 2013-12-07 NOTE — Progress Notes (Signed)
Pre visit review using our clinic review tool, if applicable. No additional management support is needed unless otherwise documented below in the visit note. 

## 2013-12-12 ENCOUNTER — Encounter: Payer: Self-pay | Admitting: Internal Medicine

## 2013-12-13 ENCOUNTER — Other Ambulatory Visit: Payer: Self-pay

## 2013-12-13 DIAGNOSIS — L989 Disorder of the skin and subcutaneous tissue, unspecified: Secondary | ICD-10-CM

## 2013-12-17 ENCOUNTER — Other Ambulatory Visit: Payer: Self-pay

## 2013-12-17 DIAGNOSIS — E785 Hyperlipidemia, unspecified: Secondary | ICD-10-CM

## 2013-12-17 MED ORDER — SIMVASTATIN 80 MG PO TABS: ORAL_TABLET | ORAL | Status: DC

## 2014-01-10 ENCOUNTER — Encounter: Payer: Self-pay | Admitting: Internal Medicine

## 2014-02-06 ENCOUNTER — Other Ambulatory Visit: Payer: Self-pay | Admitting: Internal Medicine

## 2014-03-18 ENCOUNTER — Encounter: Payer: Self-pay | Admitting: Internal Medicine

## 2014-03-22 ENCOUNTER — Other Ambulatory Visit: Payer: Self-pay | Admitting: Cardiology

## 2014-04-11 ENCOUNTER — Telehealth: Payer: Self-pay | Admitting: Internal Medicine

## 2014-04-11 ENCOUNTER — Ambulatory Visit (INDEPENDENT_AMBULATORY_CARE_PROVIDER_SITE_OTHER): Payer: Commercial Managed Care - HMO | Admitting: Cardiology

## 2014-04-11 VITALS — BP 182/88 | HR 67 | Ht 70.0 in | Wt 205.0 lb

## 2014-04-11 DIAGNOSIS — I2510 Atherosclerotic heart disease of native coronary artery without angina pectoris: Secondary | ICD-10-CM

## 2014-04-11 NOTE — Telephone Encounter (Signed)
Will discuss with Pt at next visit on 04/20/2014.

## 2014-04-11 NOTE — Telephone Encounter (Signed)
Patient states that any day will be fine but Fridays.

## 2014-04-11 NOTE — Patient Instructions (Signed)
Your physician recommends that you schedule a follow-up appointment in: one year with Dr. Hochrein  

## 2014-04-11 NOTE — Progress Notes (Signed)
HPI The patient returns for one year follow up.  Since I last saw him he has done well.    He denies any new symptoms such as chest discomfort, neck or arm discomfort. There has been no new shortness of breath, PND or orthopnea. There have been no reported palpitations, presyncope or syncope.  He says that he works in the yard without limitations.  He walks 3 - 4 x per week about 1/2 hour at a time.    Allergies  Allergen Reactions  . Telmisartan     REACTION: diarrhea    Current Outpatient Prescriptions  Medication Sig Dispense Refill  . aspirin 81 MG chewable tablet 2 daily.     Marland Kitchen co-enzyme Q-10 50 MG capsule Take 300 mg by mouth daily.     . enalapril (VASOTEC) 5 MG tablet Take 10 mg by mouth daily.    . enalapril (VASOTEC) 5 MG tablet TAKE 1 TABLET BY MOUTH TWICE A DAY AS DIRECTED 60 tablet 0  . fish oil-omega-3 fatty acids 1000 MG capsule Take 2 g by mouth daily.      . Flaxseed, Linseed, (FLAX SEED OIL PO) Take by mouth.     . metoprolol (LOPRESSOR) 100 MG tablet TAKE 1 TABLET BY MOUTH TWICE A DAY 60 tablet 6  . niacin 500 MG tablet Take 1,500 mg by mouth at bedtime.     Marland Kitchen omeprazole (PRILOSEC) 40 MG capsule TAKE ONE CAPSULE BY MOUTH EVERY DAY 30 capsule 6  . simvastatin (ZOCOR) 80 MG tablet TAKE 1 TABLET AT BEDTIME 90 tablet 1   No current facility-administered medications for this visit.    Past Medical History  Diagnosis Date  . Anxiety and depression   . Hyperlipidemia   . Hypertension   . Diabetes mellitus   . GERD (gastroesophageal reflux disease)   . PUD (peptic ulcer disease) 7/11    EGD showed few small ulcers, esophagues strecthed  . Headache(784.0) 06/2003    s/p neuro eval ? migraine  . CAD (coronary artery disease)   . Hyperplastic colon polyp 2008  . Iron deficiency     h/o  . Arthritis   . Ischemic cardiomyopathy     EF had 30-35%. The most recent EF was 50% on echo in september 2009  . Allergic rhinitis     Past Surgical History  Procedure  Laterality Date  . Coronary artery bypass graft  2004    LIMA to the LADs, sequentail SVG to intermediated and obtuse marginal, sequential SVG to PDA and posterolaterla  . Hernia repair      inguinal  . Cataract extraction Right 07-2012    ROS: As stated in the HPI and negative for all other systems.  PHYSICAL EXAM BP 182/88 mmHg  Pulse 67  Ht 5\' 10"  (1.778 m)  Wt 205 lb (92.987 kg)  BMI 29.41 kg/m2 GENERAL:  Well appearing HEENT:  Pupils equal round and reactive, fundi not visualized, oral mucosa unremarkable, dentures NECK:  No jugular venous distention, waveform within normal limits, carotid upstroke brisk and symmetric, no bruits, no thyromegaly LUNGS:  Clear to auscultation bilaterally BACK:  No CVA tenderness CHEST:  Unremarkable HEART:  PMI not displaced or sustained,S1 and S2 within normal limits, no S3, no S4, no clicks, no rubs, no murmurs ABD:  Flat, positive bowel sounds normal in frequency in pitch, no bruits, no rebound, no guarding, no midline pulsatile mass, no hepatomegaly, no splenomegaly EXT:  2 plus pulses throughout, mild edema, no cyanosis  no clubbing  EKG:  Sinus bradycardia, rate 53, old inferior infarct, possible old anteroseptal infarct, nonspecific lateral ST flattening. PACs   04/11/2014  ASSESSMENT AND PLAN  CAD -  The patient has no new symptoms since his stress test in 2014  No further cardiovascular testing is indicated.  We will continue with aggressive risk reduction and meds as listed.   HYPERTENSION -  The blood pressure is slightly elevated.  Marland Kitchen No change in medications is indicated. We will continue with therapeutic lifestyle changes (TLC).   HYPERLIPIDEMIA -  His LDL was 67 with an HDL of 24.2  most recently.  He will remain on the meds as listed.    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 He will have follow-up soon.  OBESITY - His weights are stable. He will continue with meds as listed.

## 2014-04-11 NOTE — Telephone Encounter (Signed)
Please advise 

## 2014-04-11 NOTE — Telephone Encounter (Signed)
If   is not urgent, I would prefer to address that issue when he comes back for his next appointment

## 2014-04-11 NOTE — Telephone Encounter (Signed)
Caller name: [place] Relation to pt: self Call back number: [phone] Pharmacy:  Reason for call:   Patient would like to be referred to a podiatrist. He states that he talked to Dr. Larose Kells before regarding R foot swollen and numbness in toes in both feet.

## 2014-04-12 ENCOUNTER — Other Ambulatory Visit: Payer: Self-pay | Admitting: Radiology

## 2014-04-12 ENCOUNTER — Encounter: Payer: Self-pay | Admitting: Cardiology

## 2014-04-12 DIAGNOSIS — I6523 Occlusion and stenosis of bilateral carotid arteries: Secondary | ICD-10-CM

## 2014-04-19 ENCOUNTER — Other Ambulatory Visit: Payer: Self-pay | Admitting: Cardiology

## 2014-04-20 ENCOUNTER — Ambulatory Visit (INDEPENDENT_AMBULATORY_CARE_PROVIDER_SITE_OTHER): Payer: Commercial Managed Care - HMO | Admitting: Internal Medicine

## 2014-04-20 ENCOUNTER — Encounter: Payer: Self-pay | Admitting: Internal Medicine

## 2014-04-20 VITALS — BP 146/87 | HR 58 | Temp 98.1°F | Ht 70.0 in | Wt 201.4 lb

## 2014-04-20 DIAGNOSIS — M159 Polyosteoarthritis, unspecified: Secondary | ICD-10-CM

## 2014-04-20 DIAGNOSIS — R5383 Other fatigue: Secondary | ICD-10-CM | POA: Insufficient documentation

## 2014-04-20 DIAGNOSIS — R5382 Chronic fatigue, unspecified: Secondary | ICD-10-CM

## 2014-04-20 DIAGNOSIS — M8949 Other hypertrophic osteoarthropathy, multiple sites: Secondary | ICD-10-CM

## 2014-04-20 DIAGNOSIS — E1140 Type 2 diabetes mellitus with diabetic neuropathy, unspecified: Secondary | ICD-10-CM

## 2014-04-20 DIAGNOSIS — I10 Essential (primary) hypertension: Secondary | ICD-10-CM

## 2014-04-20 DIAGNOSIS — M150 Primary generalized (osteo)arthritis: Secondary | ICD-10-CM

## 2014-04-20 LAB — SEDIMENTATION RATE: SED RATE: 8 mm/h (ref 0–22)

## 2014-04-20 LAB — BASIC METABOLIC PANEL
BUN: 27 mg/dL — AB (ref 6–23)
CALCIUM: 9.1 mg/dL (ref 8.4–10.5)
CHLORIDE: 104 meq/L (ref 96–112)
CO2: 25 mEq/L (ref 19–32)
CREATININE: 1.27 mg/dL (ref 0.40–1.50)
GFR: 58.85 mL/min — ABNORMAL LOW (ref >60.00)
Glucose, Bld: 104 mg/dL — ABNORMAL HIGH (ref 70–99)
POTASSIUM: 4.5 meq/L (ref 3.5–5.1)
Sodium: 137 mEq/L (ref 135–145)

## 2014-04-20 LAB — HEMOGLOBIN A1C: HEMOGLOBIN A1C: 6.5 % (ref 4.6–6.5)

## 2014-04-20 LAB — TSH: TSH: 0.8 u[IU]/mL (ref 0.35–4.50)

## 2014-04-20 LAB — HIGH SENSITIVITY CRP: CRP HIGH SENSITIVITY: 3.22 mg/L (ref 0.000–5.000)

## 2014-04-20 NOTE — Progress Notes (Signed)
Pre visit review using our clinic review tool, if applicable. No additional management support is needed unless otherwise documented below in the visit note. 

## 2014-04-20 NOTE — Assessment & Plan Note (Signed)
Ambulatory BPs actually very good, sometimes in the low side. No change for now

## 2014-04-20 NOTE — Patient Instructions (Signed)
Get your blood work before you leave   Please come back to the office in 2 months  for a routine check up     Hold simvastatin for 2 or 3 weeks see if that helps

## 2014-04-20 NOTE — Assessment & Plan Note (Signed)
On no medications, continue Vasotec, check A1c

## 2014-04-20 NOTE — Assessment & Plan Note (Signed)
Feeling a sleepy sometimes, epworth sleepiness scale scored 8 which is average. Probably multifactorial, not sleeping well due to pain, see comments under DJD. Plan: Reassess in 2 months, consider further eval such as repeat the testosterone level

## 2014-04-20 NOTE — Assessment & Plan Note (Addendum)
Chronic back pain, hip and leg pain. No claudication, no fever chills headaches or weight loss. Sx likely d/t DJD but recommend sedimentation rate, CRP; also  judicious use of Tylenol. Offered a rheumatology referral Hold simvastatin for 2 weeks see if that makes a difference  Return to the office 2 months

## 2014-04-20 NOTE — Progress Notes (Signed)
Subjective:    Patient ID: [name] Little, male    DOB: February 24, 1941, 74 y.o.   MRN: [id number]  DOS:  04/20/2014 Type of visit - description : rov, multiple concerns Interval history: Complaining of pain mostly at the hips with radiation to the legs, back pain which is a chronic problem. Denies shoulder pain. Symptoms are going for a while, worse at night, sx decrease when he stand up and walk a little bit  . Denies claudication . Also reports he could fall asleep easily when he relaxes, when he is focus on something he is okay. Cough at night, on and off, denies runny nose or sore throat. No snoring. Hypertension, good medication compliance, BPs range from 110 to 130, DBP 70-80. ( times SBP  Less than 110)   Review of Systems Denies fever, chills. No weight loss or headaches. No orthostatic dizziness Denies any anxiety or depression No rash Denies lower extremity paresthesias per se.  Past Medical History  Diagnosis Date  . Anxiety and depression   . Hyperlipidemia   . Hypertension   . Diabetes mellitus   . GERD (gastroesophageal reflux disease)   . PUD (peptic ulcer disease) 7/11    EGD showed few small ulcers, esophagues strecthed  . Headache(784.0) 06/2003    s/p neuro eval ? migraine  . CAD (coronary artery disease)   . Hyperplastic colon polyp 2008  . Iron deficiency     h/o  . Arthritis   . Ischemic cardiomyopathy     EF had 30-35%. The most recent EF was 50% on echo in september 2009  . Allergic rhinitis     Past Surgical History  Procedure Laterality Date  . Coronary artery bypass graft  2004    LIMA to the LADs, sequentail SVG to intermediated and obtuse marginal, sequential SVG to PDA and posterolaterla  . Hernia repair      inguinal  . Cataract extraction Right 07-2012    History   Social History  . Marital Status: Married    Spouse Name: N/A  . Number of Children: 0  . Years of Education: N/A   Occupational History  . retired Freight forwarder     Social  History Main Topics  . Smoking status: Former Smoker    Quit date: 12/16/1976  . Smokeless tobacco: Never Used  . Alcohol Use: No  . Drug Use: No  . Sexual Activity: Not on file   Other Topics Concern  . Not on file   Social History Narrative   Married , lives w/ wife , no children              Medication List       This list is accurate as of: 04/20/14  5:25 PM.  Always use your most recent med list.               aspirin 81 MG chewable tablet  2 daily.     co-enzyme Q-10 50 MG capsule  Take 400 mg by mouth daily.     enalapril 5 MG tablet  Commonly known as:  VASOTEC  Take 10 mg by mouth daily.     enalapril 5 MG tablet  Commonly known as:  VASOTEC  TAKE 1 TABLET BY MOUTH TWICE A DAY AS DIRECTED     fish oil-omega-3 fatty acids 1000 MG capsule  Take 2 g by mouth daily.     FLAX SEED OIL PO  Take by mouth.     metoprolol 100  MG tablet  Commonly known as:  LOPRESSOR  TAKE 1 TABLET BY MOUTH TWICE A DAY     niacin 500 MG tablet  Take 1,500 mg by mouth at bedtime.     omeprazole 40 MG capsule  Commonly known as:  PRILOSEC  TAKE ONE CAPSULE BY MOUTH EVERY DAY     simvastatin 80 MG tablet  Commonly known as:  ZOCOR  TAKE 1 TABLET AT BEDTIME           Objective:   Physical Exam BP 146/87 mmHg  Pulse 58  Temp(Src) 98.1 F (36.7 C) (Oral)  Ht 5\' 10"  (1.778 m)  Wt 201 lb 6 oz (91.343 kg)  BMI 28.89 kg/m2  SpO2 99%  General:   Well developed, well nourished . NAD.  HEENT:  Normocephalic . Face symmetric, atraumatic. No thyromegaly, no JVD at 45 Lungs:  CTA B Normal respiratory effort, no intercostal retractions, no accessory muscle use. Heart: RRR,  no murmur.  Muscle skeletal: no pretibial edema bilaterally  Hands and wrists without synovitis on exam Back no TTP Skin: Not pale. Not jaundice Neurologic:  alert & oriented X3.  Speech normal, gait appropriate for age and unassisted DTRs symmetric Psych--  Cognition and judgment  appear intact.  Cooperative with normal attention span and concentration.  Behavior appropriate. No anxious or depressed appearing.        Assessment & Plan:

## 2014-04-21 ENCOUNTER — Encounter: Payer: Self-pay | Admitting: Internal Medicine

## 2014-04-24 ENCOUNTER — Encounter: Payer: Self-pay | Admitting: Internal Medicine

## 2014-04-25 ENCOUNTER — Ambulatory Visit (HOSPITAL_COMMUNITY): Payer: Commercial Managed Care - HMO | Attending: Cardiovascular Disease | Admitting: Cardiology

## 2014-04-25 DIAGNOSIS — I6523 Occlusion and stenosis of bilateral carotid arteries: Secondary | ICD-10-CM

## 2014-04-25 NOTE — Progress Notes (Signed)
Carotid duplex performed 

## 2014-05-11 ENCOUNTER — Encounter: Payer: Self-pay | Admitting: Internal Medicine

## 2014-06-03 ENCOUNTER — Other Ambulatory Visit: Payer: Self-pay | Admitting: Internal Medicine

## 2014-06-03 ENCOUNTER — Other Ambulatory Visit: Payer: Self-pay

## 2014-06-12 ENCOUNTER — Other Ambulatory Visit: Payer: Self-pay | Admitting: Internal Medicine

## 2014-06-21 ENCOUNTER — Ambulatory Visit (INDEPENDENT_AMBULATORY_CARE_PROVIDER_SITE_OTHER): Payer: Commercial Managed Care - HMO | Admitting: Internal Medicine

## 2014-06-21 ENCOUNTER — Encounter: Payer: Self-pay | Admitting: Internal Medicine

## 2014-06-21 VITALS — BP 130/82 | HR 67 | Temp 98.0°F | Ht 70.0 in | Wt 205.0 lb

## 2014-06-21 DIAGNOSIS — H53143 Visual discomfort, bilateral: Secondary | ICD-10-CM

## 2014-06-21 DIAGNOSIS — R5382 Chronic fatigue, unspecified: Secondary | ICD-10-CM

## 2014-06-21 DIAGNOSIS — E785 Hyperlipidemia, unspecified: Secondary | ICD-10-CM

## 2014-06-21 DIAGNOSIS — M8949 Other hypertrophic osteoarthropathy, multiple sites: Secondary | ICD-10-CM

## 2014-06-21 DIAGNOSIS — M150 Primary generalized (osteo)arthritis: Secondary | ICD-10-CM

## 2014-06-21 DIAGNOSIS — M159 Polyosteoarthritis, unspecified: Secondary | ICD-10-CM

## 2014-06-21 DIAGNOSIS — E1140 Type 2 diabetes mellitus with diabetic neuropathy, unspecified: Secondary | ICD-10-CM

## 2014-06-21 DIAGNOSIS — H5310 Unspecified subjective visual disturbances: Secondary | ICD-10-CM

## 2014-06-21 MED ORDER — OMEPRAZOLE 40 MG PO CPDR: 40.0000 mg | DELAYED_RELEASE_CAPSULE | Freq: Every day | ORAL | Status: DC

## 2014-06-21 NOTE — Assessment & Plan Note (Signed)
On simvastatin, doing well

## 2014-06-21 NOTE — Assessment & Plan Note (Addendum)
Symptoms did not improve after he stopped simvastatin, he had significant improvement after he started using arch support shoe insert.

## 2014-06-21 NOTE — Patient Instructions (Addendum)
  Come back to the office in 4 months  for a physical exam  Please schedule an appointment at the front desk    Come back fasting

## 2014-06-21 NOTE — Assessment & Plan Note (Signed)
Improved per patient

## 2014-06-21 NOTE — Assessment & Plan Note (Signed)
Feeling about the same, reassess and return to the office

## 2014-06-21 NOTE — Progress Notes (Signed)
Subjective:    Patient ID: [name] Little, male    DOB: 01-30-1941, 74 y.o.   MRN: [id number]  DOS:  06/21/2014 Type of visit - description : Follow-up previous visit Interval history: Since the last time he was here, he stopped simvastatin for 3 or 4 weeks, pains did not change. He did get an arch support shoe insert and it took care of "95% of the pain".    Review of Systems  Denies chest pain or difficulty breathing No nausea, vomiting, diarrhea  Past Medical History  Diagnosis Date  . Anxiety and depression   . Hyperlipidemia   . Hypertension   . Diabetes mellitus   . GERD (gastroesophageal reflux disease)   . PUD (peptic ulcer disease) 7/11    EGD showed few small ulcers, esophagues strecthed  . Headache(784.0) 06/2003    s/p neuro eval ? migraine  . CAD (coronary artery disease)   . Hyperplastic colon polyp 2008  . Iron deficiency     h/o  . Arthritis   . Ischemic cardiomyopathy     EF had 30-35%. The most recent EF was 50% on echo in september 2009  . Allergic rhinitis     Past Surgical History  Procedure Laterality Date  . Coronary artery bypass graft  2004    LIMA to the LADs, sequentail SVG to intermediated and obtuse marginal, sequential SVG to PDA and posterolaterla  . Hernia repair      inguinal  . Cataract extraction Right 07-2012    History   Social History  . Marital Status: Married    Spouse Name: N/A  . Number of Children: 0  . Years of Education: N/A   Occupational History  . retired Freight forwarder     Social History Main Topics  . Smoking status: Former Smoker    Quit date: 12/16/1976  . Smokeless tobacco: Never Used  . Alcohol Use: No  . Drug Use: No  . Sexual Activity: Not on file   Other Topics Concern  . Not on file   Social History Narrative   Married , lives w/ wife , no children              Medication List       This list is accurate as of: 06/21/14  7:16 PM.  Always use your most recent med list.               aspirin 81 MG chewable tablet  2 daily.     co-enzyme Q-10 50 MG capsule  Take 400 mg by mouth daily.     enalapril 5 MG tablet  Commonly known as:  VASOTEC  TAKE 1 TABLET BY MOUTH TWICE A DAY AS DIRECTED     fish oil-omega-3 fatty acids 1000 MG capsule  Take 2 g by mouth daily.     FLAX SEED OIL PO  Take by mouth.     metoprolol 100 MG tablet  Commonly known as:  LOPRESSOR  Take 1 tablet (100 mg total) by mouth 2 (two) times daily.     niacin 500 MG tablet  Take 1,500 mg by mouth at bedtime.     omeprazole 40 MG capsule  Commonly known as:  PRILOSEC  Take 1 capsule (40 mg total) by mouth daily.     simvastatin 80 MG tablet  Commonly known as:  ZOCOR  Take 1 tablet (80 mg total) by mouth at bedtime.     TYLENOL 8 HOUR PO  Take  1 tablet by mouth as needed.           Objective:   Physical Exam BP 130/82 mmHg  Pulse 67  Temp(Src) 98 F (36.7 C) (Oral)  Ht 5\' 10"  (1.778 m)  Wt 205 lb (92.987 kg)  BMI 29.41 kg/m2  SpO2 98% General:   Well developed, well nourished . NAD.  HEENT:  Normocephalic . Face symmetric, atraumatic Lungs:  CTA B Normal respiratory effort, no intercostal retractions, no accessory muscle use. Heart: RRR,  no murmur.  Muscle skeletal: no pretibial edema bilaterally  Skin: Not pale. Not jaundice Neurologic:  alert & oriented X3.  Speech normal, gait appropriate for age and unassisted Psych--  Cognition and judgment appear intact.  Cooperative with normal attention span and concentration.  Behavior appropriate. No anxious or depressed appearing.        Assessment & Plan:

## 2014-06-21 NOTE — Assessment & Plan Note (Signed)
Last A1c satisfactory, recheck A1c in few months

## 2014-06-21 NOTE — Progress Notes (Signed)
Pre visit review using our clinic review tool, if applicable. No additional management support is needed unless otherwise documented below in the visit note. 

## 2014-06-23 LAB — HM DIABETES EYE EXAM

## 2014-07-22 ENCOUNTER — Telehealth: Payer: Self-pay | Admitting: Internal Medicine

## 2014-07-22 ENCOUNTER — Emergency Department (HOSPITAL_BASED_OUTPATIENT_CLINIC_OR_DEPARTMENT_OTHER): Payer: Commercial Managed Care - HMO

## 2014-07-22 ENCOUNTER — Emergency Department (HOSPITAL_BASED_OUTPATIENT_CLINIC_OR_DEPARTMENT_OTHER)
Admission: EM | Admit: 2014-07-22 | Discharge: 2014-07-22 | Disposition: A | Discharge status: Home / Self Care | Payer: Commercial Managed Care - HMO | Attending: Emergency Medicine | Admitting: Emergency Medicine

## 2014-07-22 ENCOUNTER — Encounter (HOSPITAL_BASED_OUTPATIENT_CLINIC_OR_DEPARTMENT_OTHER): Payer: Self-pay | Admitting: Family Medicine

## 2014-07-22 DIAGNOSIS — Z862 Personal history of diseases of the blood and blood-forming organs and certain disorders involving the immune mechanism: Secondary | ICD-10-CM | POA: Insufficient documentation

## 2014-07-22 DIAGNOSIS — Z86010 Personal history of colonic polyps: Secondary | ICD-10-CM | POA: Insufficient documentation

## 2014-07-22 DIAGNOSIS — E785 Hyperlipidemia, unspecified: Secondary | ICD-10-CM | POA: Diagnosis not present

## 2014-07-22 DIAGNOSIS — Z79899 Other long term (current) drug therapy: Secondary | ICD-10-CM | POA: Diagnosis not present

## 2014-07-22 DIAGNOSIS — R42 Dizziness and giddiness: Secondary | ICD-10-CM | POA: Diagnosis not present

## 2014-07-22 DIAGNOSIS — M1990 Unspecified osteoarthritis, unspecified site: Secondary | ICD-10-CM | POA: Diagnosis not present

## 2014-07-22 DIAGNOSIS — E119 Type 2 diabetes mellitus without complications: Secondary | ICD-10-CM | POA: Insufficient documentation

## 2014-07-22 DIAGNOSIS — R630 Anorexia: Secondary | ICD-10-CM | POA: Diagnosis not present

## 2014-07-22 DIAGNOSIS — Z8659 Personal history of other mental and behavioral disorders: Secondary | ICD-10-CM | POA: Insufficient documentation

## 2014-07-22 DIAGNOSIS — R55 Syncope and collapse: Secondary | ICD-10-CM | POA: Insufficient documentation

## 2014-07-22 DIAGNOSIS — K219 Gastro-esophageal reflux disease without esophagitis: Secondary | ICD-10-CM | POA: Insufficient documentation

## 2014-07-22 DIAGNOSIS — Z8711 Personal history of peptic ulcer disease: Secondary | ICD-10-CM | POA: Insufficient documentation

## 2014-07-22 DIAGNOSIS — Z87891 Personal history of nicotine dependence: Secondary | ICD-10-CM | POA: Diagnosis not present

## 2014-07-22 DIAGNOSIS — Z951 Presence of aortocoronary bypass graft: Secondary | ICD-10-CM | POA: Insufficient documentation

## 2014-07-22 DIAGNOSIS — I10 Essential (primary) hypertension: Secondary | ICD-10-CM | POA: Insufficient documentation

## 2014-07-22 DIAGNOSIS — I2510 Atherosclerotic heart disease of native coronary artery without angina pectoris: Secondary | ICD-10-CM | POA: Diagnosis not present

## 2014-07-22 LAB — RAPID URINE DRUG SCREEN, HOSP PERFORMED
AMPHETAMINES: NOT DETECTED
BARBITURATES: NOT DETECTED
Benzodiazepines: NOT DETECTED
Cocaine: NOT DETECTED
Opiates: NOT DETECTED
Tetrahydrocannabinol: NOT DETECTED

## 2014-07-22 LAB — CBC WITH DIFFERENTIAL/PLATELET
BASOS ABS: 0.1 10*3/uL (ref 0.0–0.1)
Basophils Relative: 1 % (ref 0–1)
EOS PCT: 5 % (ref 0–5)
Eosinophils Absolute: 0.3 10*3/uL (ref 0.0–0.7)
HCT: 39.8 % (ref 39.0–52.0)
HEMOGLOBIN: 12.5 g/dL — AB (ref 13.0–17.0)
LYMPHS PCT: 17 % (ref 12–46)
Lymphs Abs: 1.1 10*3/uL (ref 0.7–4.0)
MCH: 25.9 pg — AB (ref 26.0–34.0)
MCHC: 31.4 g/dL (ref 30.0–36.0)
MCV: 82.4 fL (ref 78.0–100.0)
MONO ABS: 0.5 10*3/uL (ref 0.1–1.0)
MONOS PCT: 8 % (ref 3–12)
Neutro Abs: 4.4 10*3/uL (ref 1.7–7.7)
Neutrophils Relative %: 69 % (ref 43–77)
Platelets: 142 10*3/uL — ABNORMAL LOW (ref 150–400)
RBC: 4.83 MIL/uL (ref 4.22–5.81)
RDW: 14.9 % (ref 11.5–15.5)
WBC: 6.3 10*3/uL (ref 4.0–10.5)

## 2014-07-22 LAB — APTT: aPTT: 27 seconds (ref 24–37)

## 2014-07-22 LAB — COMPREHENSIVE METABOLIC PANEL
ALK PHOS: 58 U/L (ref 38–126)
ALT: 19 U/L (ref 17–63)
ANION GAP: 9 (ref 5–15)
AST: 21 U/L (ref 15–41)
Albumin: 4.3 g/dL (ref 3.5–5.0)
BUN: 19 mg/dL (ref 6–20)
CO2: 26 mmol/L (ref 22–32)
Calcium: 8.9 mg/dL (ref 8.9–10.3)
Chloride: 102 mmol/L (ref 101–111)
Creatinine, Ser: 1.31 mg/dL — ABNORMAL HIGH (ref 0.61–1.24)
GFR calc non Af Amer: 52 mL/min — ABNORMAL LOW (ref >60)
GLUCOSE: 149 mg/dL — AB (ref 65–99)
POTASSIUM: 3.6 mmol/L (ref 3.5–5.1)
SODIUM: 137 mmol/L (ref 135–145)
TOTAL PROTEIN: 7.2 g/dL (ref 6.5–8.1)
Total Bilirubin: 0.5 mg/dL (ref 0.3–1.2)

## 2014-07-22 LAB — CBG MONITORING, ED: Glucose-Capillary: 153 mg/dL — ABNORMAL HIGH (ref 65–99)

## 2014-07-22 LAB — URINALYSIS, ROUTINE W REFLEX MICROSCOPIC
BILIRUBIN URINE: NEGATIVE
Glucose, UA: NEGATIVE mg/dL
Hgb urine dipstick: NEGATIVE
Ketones, ur: NEGATIVE mg/dL
Leukocytes, UA: NEGATIVE
Nitrite: NEGATIVE
PH: 6 (ref 5.0–8.0)
Protein, ur: NEGATIVE mg/dL
SPECIFIC GRAVITY, URINE: 1.007 (ref 1.005–1.030)
Urobilinogen, UA: 0.2 mg/dL (ref 0.0–1.0)

## 2014-07-22 LAB — PROTIME-INR
INR: 1.02 (ref 0.00–1.49)
Prothrombin Time: 13.6 seconds (ref 11.6–15.2)

## 2014-07-22 LAB — TROPONIN I
Troponin I: <0.03 ng/mL (ref <0.031)
Troponin I: <0.03 ng/mL (ref <0.031)

## 2014-07-22 LAB — OCCULT BLOOD X 1 CARD TO LAB, STOOL: FECAL OCCULT BLD: NEGATIVE

## 2014-07-22 MED ORDER — ACETAMINOPHEN 325 MG PO TABS
650.0000 mg | ORAL_TABLET | Freq: Once | ORAL | Status: AC
  Administered 2014-07-22: 650 mg via ORAL
  Filled 2014-07-22: qty 2

## 2014-07-22 MED ORDER — SODIUM CHLORIDE 0.9 % IV SOLN
Freq: Once | INTRAVENOUS | Status: AC
  Administered 2014-07-22: 17:00:00 via INTRAVENOUS

## 2014-07-22 NOTE — ED Notes (Signed)
Pt ambulated to radiology and back with no problem.

## 2014-07-22 NOTE — ED Notes (Signed)
Pt states he was walking a long distance and started feeling dizzy and like he was going to pass out. He also reports the left side of his face feeling hot. Denies chest pain, shortness of breath, weakness in extremities, or vision changes.

## 2014-07-22 NOTE — Telephone Encounter (Signed)
[facility] Day - Client TELEPHONE ADVICE RECORD [facility] Patient Name: [name] Little DOB: 11/01/1940 Initial Comment Dizziness and BP not stable. 157/89 last checked Nurse Assessment Nurse: Genoveva Ill, RN, Lattie Haw Date/Time (Eastern Time): 07/22/2014 3:14:16 PM Confirm and document reason for call. If symptomatic, describe symptoms. ---pt states he got dizzy on a walk; BP fluctuated from 120/87 to 157/104 over about 20 min; 157/94 now ; taking bp meds feels stuffy and hot; thinks has allergies; denies dizziness or lightheadedness now Has the patient traveled out of the country within the last 30 days? ---No Does the patient require triage? ---Yes Related visit to physician within the last 2 weeks? ---No Does the PT have any chronic conditions? (i.e. diabetes, asthma, etc.) ---Yes List chronic conditions. ---HTN, high cholesterol Guidelines Guideline Title Affirmed Question Affirmed Notes High Blood Pressure BP # 160/100 Final Disposition User See PCP When Office is Open (within 3 days) [place], RN, Lattie Haw Comments unable to find appt for today and caller declined [place] Sat appt, stating he doesn't feel right and is going to ER

## 2014-07-22 NOTE — ED Notes (Signed)
Patient transported to CT 

## 2014-07-22 NOTE — Discharge Instructions (Signed)
Near-Syncope As we discussed, Follow up with Dr. Larose Kells On Monday. You may benefit from a repeat echocardiogram. Return to the ED if you develop chest pain, dizziness, shortnes of breath, or any other concerns. Near-syncope (commonly known as near fainting) is sudden weakness, dizziness, or feeling like you might pass out. During an episode of near-syncope, you may also develop pale skin, have tunnel vision, or feel sick to your stomach (nauseous). Near-syncope may occur when getting up after sitting or while standing for a long time. It is caused by a sudden decrease in blood flow to the brain. This decrease can result from various causes or triggers, most of which are not serious. However, because near-syncope can sometimes be a sign of something serious, a medical evaluation is required. The specific cause is often not determined. HOME CARE INSTRUCTIONS  Monitor your condition for any changes. The following actions may help to alleviate any discomfort you are experiencing:  Have someone stay with you until you feel stable.  Lie down right away and prop your feet up if you start feeling like you might faint. Breathe deeply and steadily. Wait until all the symptoms have passed. Most of these episodes last only a few minutes. You may feel tired for several hours.   Drink enough fluids to keep your urine clear or pale yellow.   If you are taking blood pressure or heart medicine, get up slowly when seated or lying down. Take several minutes to sit and then stand. This can reduce dizziness.  Follow up with your health care provider as directed. SEEK IMMEDIATE MEDICAL CARE IF:   You have a severe headache.   You have unusual pain in the chest, abdomen, or back.   You are bleeding from the mouth or rectum, or you have black or tarry stool.   You have an irregular or very fast heartbeat.   You have repeated fainting or have seizure-like jerking during an episode.   You faint when sitting or  lying down.   You have confusion.   You have difficulty walking.   You have severe weakness.   You have vision problems.  MAKE SURE YOU:   Understand these instructions.  Will watch your condition.  Will get help right away if you are not doing well or get worse. Document Released: 02/18/2005 Document Revised: 02/23/2013 Document Reviewed: 07/24/2012 [facility] Patient Information 2015 [place], [place]. This information is not intended to replace advice given to you by your health care provider. Make sure you discuss any questions you have with your health care provider.

## 2014-07-22 NOTE — ED Provider Notes (Signed)
CSN: [id number]     Arrival date & time 07/22/14  1607 History   First MD Initiated Contact with Patient 07/22/14 1619     Chief Complaint  Patient presents with  . Dizziness     (Consider location/radiation/quality/duration/timing/severity/associated sxs/prior Treatment) HPI Comments: Patient presents with near syncopal episode that occurred after he was walking. He states he walked about a half mile with his wife and step to talk with a neighbor. He then became lightheaded and felt he was going to pass out. he denies any spinning sensation. He is able to walk home and checked his blood pressure and found it was 540 systolic. He do not believe his blood pressure cuff so he went to the [facility] where his blood pressure was 180. He denies any headache, vision change, chest pain or shortness of breath. No abdominal pain or back pain. His dizziness has improved. He denies any vertigo, focal weakness, numbness, tingling. Endorses a "flushed and hot feeling" in his face that he's had ongoing for several months that his doctor has attributed to allergies. No difficulty speaking or swallowing. History of hypertension hyperlipidemia, prediabetes, CAD status post bypass with ischemic cardiomyopathy. Denies any chest pain or shortness of breath. Denies any previous syncopal episodes.  Patient is a 74 y.o. male presenting with dizziness. The history is provided by the patient and the spouse.  Dizziness Associated symptoms: no chest pain, no nausea, no shortness of breath, no vomiting and no weakness     Past Medical History  Diagnosis Date  . Anxiety and depression   . Hyperlipidemia   . Hypertension   . Diabetes mellitus   . GERD (gastroesophageal reflux disease)   . PUD (peptic ulcer disease) 7/11    EGD showed few small ulcers, esophagues strecthed  . Headache(784.0) 06/2003    s/p neuro eval ? migraine  . CAD (coronary artery disease)   . Hyperplastic colon polyp 2008  . Iron deficiency      h/o  . Arthritis   . Ischemic cardiomyopathy     EF had 30-35%. The most recent EF was 50% on echo in september 2009  . Allergic rhinitis    Past Surgical History  Procedure Laterality Date  . Coronary artery bypass graft  2004    LIMA to the LADs, sequentail SVG to intermediated and obtuse marginal, sequential SVG to PDA and posterolaterla  . Hernia repair      inguinal  . Cataract extraction Right 07-2012   Family History  Problem Relation Age of Onset  . Diabetes Father   . Colon cancer Neg Hx   . Prostate cancer Neg Hx   . Heart attack Mother   . Heart attack Father    History  Substance Use Topics  . Smoking status: Former Smoker    Quit date: 12/16/1976  . Smokeless tobacco: Never Used  . Alcohol Use: No    Review of Systems  Constitutional: Positive for activity change and appetite change. Negative for fever and fatigue.  HENT: Negative for congestion, rhinorrhea and voice change.   Eyes: Negative for visual disturbance.  Respiratory: Negative for cough, chest tightness and shortness of breath.   Cardiovascular: Negative for chest pain.  Gastrointestinal: Negative for nausea, vomiting and abdominal pain.  Genitourinary: Negative for dysuria and hematuria.  Musculoskeletal: Negative for myalgias and arthralgias.  Neurological: Positive for dizziness and light-headedness. Negative for weakness and numbness.  A complete 10 system review of systems was obtained and all systems are negative  except as noted in the HPI and PMH.      Allergies  Telmisartan  Home Medications   Prior to Admission medications   Medication Sig Start Date End Date Taking? Authorizing Provider  Acetaminophen (TYLENOL 8 HOUR PO) Take 1 tablet by mouth as needed.    Historical Provider, MD  aspirin 81 MG chewable tablet 2 daily.     Historical Provider, MD  co-enzyme Q-10 50 MG capsule Take 400 mg by mouth daily.     Historical Provider, MD  enalapril (VASOTEC) 5 MG tablet TAKE 1 TABLET  BY MOUTH TWICE A DAY AS DIRECTED 04/20/14   [name], MD  fish oil-omega-3 fatty acids 1000 MG capsule Take 2 g by mouth daily.      Historical Provider, MD  Flaxseed, Linseed, (FLAX SEED OIL PO) Take by mouth.     Historical Provider, MD  metoprolol (LOPRESSOR) 100 MG tablet Take 1 tablet (100 mg total) by mouth 2 (two) times daily. 06/03/14   Colon Branch, MD  niacin 500 MG tablet Take 1,500 mg by mouth at bedtime.     Historical Provider, MD  omeprazole (PRILOSEC) 40 MG capsule Take 1 capsule (40 mg total) by mouth daily. 06/21/14   Colon Branch, MD  simvastatin (ZOCOR) 80 MG tablet Take 1 tablet (80 mg total) by mouth at bedtime. 06/13/14   Colon Branch, MD   BP 145/70 mmHg  Pulse 79  Resp 18  SpO2 95% Physical Exam  Constitutional: He is oriented to person, place, and time. He appears well-developed and well-nourished. No distress.  Appears anxious, mild tremor  HENT:  Head: Normocephalic and atraumatic.  Mouth/Throat: Oropharynx is clear and moist. No oropharyngeal exudate.  Eyes: Conjunctivae and EOM are normal. Pupils are equal, round, and reactive to light.  Neck: Normal range of motion. Neck supple.  No meningismus.  Cardiovascular: Normal rate, regular rhythm, normal heart sounds and intact distal pulses.   No murmur heard. Pulmonary/Chest: Effort normal and breath sounds normal. No respiratory distress. He exhibits no tenderness.  Abdominal: Soft. There is no tenderness. There is no rebound and no guarding.  Musculoskeletal: Normal range of motion. He exhibits no edema or tenderness.  Neurological: He is alert and oriented to person, place, and time. No cranial nerve deficit. He exhibits normal muscle tone. Coordination normal.  No ataxia on finger to nose bilaterally. No pronator drift. 5/5 strength throughout. CN 2-12 intact. Negative Romberg. Equal grip strength. Sensation intact. Gait is normal.   Skin: Skin is warm.  Psychiatric: He has a normal mood and affect. His behavior  is normal.  Nursing note and vitals reviewed.   ED Course  Procedures (including critical care time) Labs Review Labs Reviewed  CBC WITH DIFFERENTIAL/PLATELET - Abnormal; Notable for the following:    Hemoglobin 12.5 (*)    MCH 25.9 (*)    Platelets 142 (*)    All other components within normal limits  COMPREHENSIVE METABOLIC PANEL - Abnormal; Notable for the following:    Glucose, Bld 149 (*)    Creatinine, Ser 1.31 (*)    GFR calc non Af Amer 52 (*)    All other components within normal limits  CBG MONITORING, ED - Abnormal; Notable for the following:    Glucose-Capillary 153 (*)    All other components within normal limits  TROPONIN I  URINALYSIS, ROUTINE W REFLEX MICROSCOPIC  PROTIME-INR  APTT  URINE RAPID DRUG SCREEN (HOSP PERFORMED)  TROPONIN I  OCCULT BLOOD  X 1 CARD TO LAB, STOOL  POC OCCULT BLOOD, ED    Imaging Review Dg Chest 2 View  07/22/2014   CLINICAL DATA:  Dizziness for 30 seconds. Blood pressure up and down.  EXAM: CHEST  2 VIEW  COMPARISON:  05/16/2008  FINDINGS: Status post median sternotomy and CABG. Heart is upper normal in size. Lungs are clear. No pulmonary edema. Visualized osseous structures have a normal appearance.  IMPRESSION: No active cardiopulmonary disease.   Electronically Signed   By: Nolon Nations M.D.   On: 07/22/2014 18:28   Ct Head Wo Contrast  07/22/2014   CLINICAL DATA:  74 year old male with a history of dizziness  EXAM: CT HEAD WITHOUT CONTRAST  TECHNIQUE: Contiguous axial images were obtained from the base of the skull through the vertex without intravenous contrast.  COMPARISON:  07/20/2012  FINDINGS: Unremarkable appearance of the calvarium without acute fracture or aggressive lesion.  Unremarkable appearance of the scalp soft tissues.  Unremarkable appearance of the bilateral orbits.  Right lens extraction  Mastoid air cells are clear.  No significant paranasal sinus disease  No acute intracranial hemorrhage, midline shift, or mass  effect.  Gray-white differentiation is maintained, without CT evidence of acute ischemia.  Unchanged configuration of the ventricles.  Calcifications of the intracranial vasculature.  IMPRESSION: No CT evidence of acute intracranial abnormality.  Intracranial atherosclerosis.  Signed,  Dulcy Fanny. Earleen Newport, DO  [facility]  [facility]   Electronically Signed   By: Corrie Mckusick D.O.   On: 07/22/2014 16:58     EKG Interpretation   Date/Time:  Friday Jul 22 2014 16:19:12 EDT Ventricular Rate:  86 PR Interval:  198 QRS Duration: 86 QT Interval:  394 QTC Calculation: 471 R Axis:   42 Text Interpretation:  Sinus rhythm with occasional Premature ventricular  complexes Possible Inferior infarct , age undetermined Anteroseptal  infarct , age undetermined Abnormal ECG No significant change was found  Confirmed by Wyvonnia Dusky  MD, [place] [phone]) on 07/22/2014 4:34:17 PM      MDM   Final diagnoses:  Near syncope   patient with near syncopal episode with lightheadedness and dizziness now resolved. No chest pain, shortness of breath, focal weakness, numbness or tingling. No vision changes. No abdominal pain or back pain.  EKG is stable compared to previous. With anterior and inferior Q waves. Neurological exam is nonfocal. CT head is negative. Labs are at baseline and reassuring. FOBT negative.  Orthostatics are negative. Patient is given IV fluid.  Urinalysis negative. Creatinine at baseline.  Record review shows patient has not had echocardiograms 2009. No arrhythmias in the ED. EF was recovered at that time.   He feels back to baseline. He is tolerating by mouth and ambulatory. No further episodes of dizziness. Blood pressure is normalized.  Discussed with patient that he has significant cardiac risk factors may benefit from observation admission. He declines this and wishes to follow up with his cardiologist. Troponin negative x2. No CP or  SOB.  Patient declines admission and wishes to go home.  He states he will call his PCP and cardiologist on Monday.  He denies CP, SOB, dizziness, nausea, vomiting or any other concerns. He understands that he is free to return at any time.      Ezequiel Essex, MD 07/22/14 [phone]

## 2014-07-25 ENCOUNTER — Telehealth: Payer: Self-pay | Admitting: Cardiology

## 2014-07-25 NOTE — Telephone Encounter (Signed)
New Message      Pt calling stating he went to the ER and they stated that he needed to have an Echocardiogram. There is no order in Epic, please call pt back and advise.

## 2014-07-25 NOTE — Telephone Encounter (Signed)
At last look I had no open appointments.  He can be added to flex if available or if I get a cancellation this week.

## 2014-07-25 NOTE — Telephone Encounter (Signed)
Patient went to [place] on 07/22/14.

## 2014-07-25 NOTE — Telephone Encounter (Signed)
Spoke to patient. He had episode of near syncope Friday afternoon while out walking. Came home, took BP readings x7 - "it was all over creation".  Went to ED for eval. W/u included troponins x2 (neg), EKG, head CT.  ED physician wanted to admit overnight for obs, pt declined. Total stay in ED ~6 hrs.  Reports didn't feel that great Saturday but by Sunday was feeling fine.  Pt called bc ED physician recommended him to f/u w/ cardiologist, also, to inquire about echocardiogram (last done in 2009).   Will route to Dr. Percival Spanish for recommendations.

## 2014-10-19 ENCOUNTER — Telehealth: Payer: Self-pay | Admitting: Internal Medicine

## 2014-10-19 ENCOUNTER — Telehealth: Payer: Self-pay | Admitting: Cardiology

## 2014-10-19 NOTE — Telephone Encounter (Signed)
Spoke with patient and informed him there is no documentation that anyone from our office called him today - he has not had any testing, has not needed refills, nor is due for an OV anytime soon.

## 2014-10-19 NOTE — Telephone Encounter (Signed)
pre visit letter mailed 10/12/14  °

## 2014-10-19 NOTE — Telephone Encounter (Signed)
Returning call,does not know who called.

## 2014-10-26 ENCOUNTER — Other Ambulatory Visit: Payer: Self-pay | Admitting: Internal Medicine

## 2014-10-26 DIAGNOSIS — I6523 Occlusion and stenosis of bilateral carotid arteries: Secondary | ICD-10-CM

## 2014-11-01 ENCOUNTER — Ambulatory Visit (HOSPITAL_COMMUNITY)
Admission: RE | Admit: 2014-11-01 | Discharge: 2014-11-01 | Disposition: A | Discharge status: Home / Self Care | Payer: Commercial Managed Care - HMO | Source: Ambulatory Visit | Attending: Cardiology | Admitting: Cardiology

## 2014-11-01 DIAGNOSIS — I10 Essential (primary) hypertension: Secondary | ICD-10-CM | POA: Diagnosis not present

## 2014-11-01 DIAGNOSIS — E785 Hyperlipidemia, unspecified: Secondary | ICD-10-CM | POA: Diagnosis not present

## 2014-11-01 DIAGNOSIS — F17200 Nicotine dependence, unspecified, uncomplicated: Secondary | ICD-10-CM | POA: Diagnosis not present

## 2014-11-01 DIAGNOSIS — I6523 Occlusion and stenosis of bilateral carotid arteries: Secondary | ICD-10-CM | POA: Diagnosis not present

## 2014-11-02 ENCOUNTER — Encounter: Payer: Self-pay | Admitting: Internal Medicine

## 2014-11-02 ENCOUNTER — Ambulatory Visit (INDEPENDENT_AMBULATORY_CARE_PROVIDER_SITE_OTHER): Payer: Commercial Managed Care - HMO | Admitting: Internal Medicine

## 2014-11-02 ENCOUNTER — Encounter: Payer: Self-pay | Admitting: Cardiology

## 2014-11-02 VITALS — BP 102/68 | HR 58 | Temp 97.5°F | Ht 70.0 in | Wt 212.4 lb

## 2014-11-02 DIAGNOSIS — Z0000 Encounter for general adult medical examination without abnormal findings: Secondary | ICD-10-CM

## 2014-11-02 DIAGNOSIS — R55 Syncope and collapse: Secondary | ICD-10-CM

## 2014-11-02 DIAGNOSIS — E785 Hyperlipidemia, unspecified: Secondary | ICD-10-CM | POA: Diagnosis not present

## 2014-11-02 DIAGNOSIS — I25709 Atherosclerosis of coronary artery bypass graft(s), unspecified, with unspecified angina pectoris: Secondary | ICD-10-CM

## 2014-11-02 DIAGNOSIS — Z09 Encounter for follow-up examination after completed treatment for conditions other than malignant neoplasm: Secondary | ICD-10-CM

## 2014-11-02 DIAGNOSIS — D649 Anemia, unspecified: Secondary | ICD-10-CM | POA: Diagnosis not present

## 2014-11-02 DIAGNOSIS — R399 Unspecified symptoms and signs involving the genitourinary system: Secondary | ICD-10-CM | POA: Diagnosis not present

## 2014-11-02 LAB — LIPID PANEL
CHOL/HDL RATIO: 5
Cholesterol: 123 mg/dL (ref 0–200)
HDL: 26.8 mg/dL — ABNORMAL LOW (ref >39.00)
LDL Cholesterol: 58 mg/dL (ref 0–99)
NonHDL: 96.18
Triglycerides: 192 mg/dL — ABNORMAL HIGH (ref 0.0–149.0)
VLDL: 38.4 mg/dL (ref 0.0–40.0)

## 2014-11-02 LAB — CBC WITH DIFFERENTIAL/PLATELET
BASOS ABS: 0.1 10*3/uL (ref 0.0–0.1)
Basophils Relative: 1 % (ref 0.0–3.0)
Eosinophils Absolute: 0.6 10*3/uL (ref 0.0–0.7)
Eosinophils Relative: 8.8 % — ABNORMAL HIGH (ref 0.0–5.0)
HCT: 38.2 % — ABNORMAL LOW (ref 39.0–52.0)
HEMOGLOBIN: 12.4 g/dL — AB (ref 13.0–17.0)
LYMPHS PCT: 25.3 % (ref 12.0–46.0)
Lymphs Abs: 1.7 10*3/uL (ref 0.7–4.0)
MCHC: 32.5 g/dL (ref 30.0–36.0)
MCV: 80.2 fl (ref 78.0–100.0)
Monocytes Absolute: 0.6 10*3/uL (ref 0.1–1.0)
Monocytes Relative: 8.4 % (ref 3.0–12.0)
NEUTROS PCT: 56.5 % (ref 43.0–77.0)
Neutro Abs: 3.9 10*3/uL (ref 1.4–7.7)
Platelets: 148 10*3/uL — ABNORMAL LOW (ref 150.0–400.0)
RBC: 4.76 Mil/uL (ref 4.22–5.81)
RDW: 16.4 % — ABNORMAL HIGH (ref 11.5–15.5)
WBC: 6.9 10*3/uL (ref 4.0–10.5)

## 2014-11-02 LAB — URINALYSIS, ROUTINE W REFLEX MICROSCOPIC
Bilirubin Urine: NEGATIVE
Hgb urine dipstick: NEGATIVE
KETONES UR: NEGATIVE
LEUKOCYTES UA: NEGATIVE
Nitrite: NEGATIVE
RBC / HPF: NONE SEEN (ref 0–?)
SPECIFIC GRAVITY, URINE: 1.015 (ref 1.000–1.030)
Total Protein, Urine: NEGATIVE
URINE GLUCOSE: NEGATIVE
UROBILINOGEN UA: 0.2 (ref 0.0–1.0)
pH: 6 (ref 5.0–8.0)

## 2014-11-02 LAB — BRAIN NATRIURETIC PEPTIDE: Pro B Natriuretic peptide (BNP): 111 pg/mL — ABNORMAL HIGH (ref 0.0–100.0)

## 2014-11-02 LAB — PSA, MEDICARE: PSA: 0.53 ng/ml (ref 0.10–4.00)

## 2014-11-02 NOTE — Patient Instructions (Signed)
Get your blood work before you leave     Check the  blood pressure 2 or 3 times a  Week Be sure your blood pressure is between 110/65 and  145/85.  if it is consistently higher or lower, let me know   Next visit  for a  routine checkup in 4 months Please schedule an appointment at the front desk No need to come back fasting      Fall Prevention and [place] cause injuries and can affect all age groups. It is possible to use preventive measures to significantly decrease the likelihood of falls. There are many simple measures which can make your home safer and prevent falls. OUTDOORS  Repair cracks and edges of walkways and driveways.  Remove high doorway thresholds.  Trim shrubbery on the main path into your home.  Have good outside lighting.  Clear walkways of tools, rocks, debris, and clutter.  Check that handrails are not broken and are securely fastened. Both sides of steps should have handrails.  Have leaves, snow, and ice cleared regularly.  Use sand or salt on walkways during winter months.  In the garage, clean up grease or oil spills. BATHROOM  Install night lights.  Install grab bars by the toilet and in the tub and shower.  Use non-skid mats or decals in the tub or shower.  Place a plastic non-slip stool in the shower to sit on, if needed.  Keep floors dry and clean up all water on the floor immediately.  Remove soap buildup in the tub or shower on a regular basis.  Secure bath mats with non-slip, double-sided rug tape.  Remove throw rugs and tripping hazards from the floors. BEDROOMS  Install night lights.  Make sure a bedside light is easy to reach.  Do not use oversized bedding.  Keep a telephone by your bedside.  Have a firm chair with side arms to use for getting dressed.  Remove throw rugs and tripping hazards from the floor. KITCHEN  Keep handles on pots and pans turned toward the center of the stove. Use back burners when  possible.  Clean up spills quickly and allow time for drying.  Avoid walking on wet floors.  Avoid hot utensils and knives.  Position shelves so they are not too high or low.  Place commonly used objects within easy reach.  If necessary, use a sturdy step stool with a grab bar when reaching.  Keep electrical cables out of the way.  Do not use floor polish or wax that makes floors slippery. If you must use wax, use non-skid floor wax.  Remove throw rugs and tripping hazards from the floor. STAIRWAYS  Never leave objects on stairs.  Place handrails on both sides of stairways and use them. Fix any loose handrails. Make sure handrails on both sides of the stairways are as long as the stairs.  Check carpeting to make sure it is firmly attached along stairs. Make repairs to worn or loose carpet promptly.  Avoid placing throw rugs at the top or bottom of stairways, or properly secure the rug with carpet tape to prevent slippage. Get rid of throw rugs, if possible.  Have an electrician put in a light switch at the top and bottom of the stairs. OTHER FALL PREVENTION TIPS  Wear low-heel or rubber-soled shoes that are supportive and fit well. Wear closed toe shoes.  When using a stepladder, make sure it is fully opened and both spreaders are firmly locked. Do  not climb a closed stepladder.  Add color or contrast paint or tape to grab bars and handrails in your home. Place contrasting color strips on first and last steps.  Learn and use mobility aids as needed. Install an electrical emergency response system.  Turn on lights to avoid dark areas. Replace light bulbs that burn out immediately. Get light switches that glow.  Arrange furniture to create clear pathways. Keep furniture in the same place.  Firmly attach carpet with non-skid or double-sided tape.  Eliminate uneven floor surfaces.  Select a carpet pattern that does not visually hide the edge of steps.  Be aware of all  pets. OTHER HOME SAFETY TIPS  Set the water temperature for 120 F (48.8 C).  Keep emergency numbers on or near the telephone.  Keep smoke detectors on every level of the home and near sleeping areas. Document Released: 02/08/2002 Document Revised: 08/20/2011 Document Reviewed: 05/10/2011 [facility] Patient Information 2015 [place], [place]. This information is not intended to replace advice given to you by your health care provider. Make sure you discuss any questions you have with your health care provider.   Preventive Care for Adults Ages 14 and over  Blood pressure check.** / Every 1 to 2 years.  Lipid and cholesterol check.**/ Every 5 years beginning at age 71.  Lung cancer screening. / Every year if you are aged 69-80 years and have a 30-pack-year history of smoking and currently smoke or have quit within the past 15 years. Yearly screening is stopped once you have quit smoking for at least 15 years or develop a health problem that would prevent you from having lung cancer treatment.  Fecal occult blood test (FOBT) of stool. / Every year beginning at age 27 and continuing until age 45. You may not have to do this test if you get a colonoscopy every 10 years.  Flexible sigmoidoscopy** or colonoscopy.** / Every 5 years for a flexible sigmoidoscopy or every 10 years for a colonoscopy beginning at age 38 and continuing until age 32.  Hepatitis C blood test.** / For all people born from [birth year] through 1966 and any individual with known risks for hepatitis C.  Abdominal aortic aneurysm (AAA) screening.** / A one-time screening for ages 42 to 64 years who are current or former smokers.  Skin self-exam. / Monthly.  Influenza vaccine. / Every year.  Tetanus, diphtheria, and acellular pertussis (Tdap/Td) vaccine.** / 1 dose of Td every 10 years.  Varicella vaccine.** / Consult your health care provider.  Zoster vaccine.** / 1 dose for adults aged 42 years or older.  Pneumococcal  13-valent conjugate (PCV13) vaccine.** / Consult your health care provider.  Pneumococcal polysaccharide (PPSV23) vaccine.** / 1 dose for all adults aged 20 years and older.  Meningococcal vaccine.** / Consult your health care provider.  Hepatitis A vaccine.** / Consult your health care provider.  Hepatitis B vaccine.** / Consult your health care provider.  Haemophilus influenzae type b (Hib) vaccine.** / Consult your health care provider. **Family history and personal history of risk and conditions may change your health care provider's recommendations. Document Released: 04/16/2001 Document Revised: 02/23/2013 Document Reviewed: 07/16/2010 [facility] Patient Information 2015 [place], [place]. This information is not intended to replace advice given to you by your health care provider. Make sure you discuss any questions you have with your health care provider.

## 2014-11-02 NOTE — Progress Notes (Signed)
Subjective:    Patient ID: [name] Little, male    DOB: [birth year], 74 y.o.   MRN: [id number]  DOS:  11/02/2014 Type of visit - description :  Here for Medicare AWV:  1. Risk factors based on Past M, S, F history: reviewed 2. Physical Activities:  Not active 3. Depression/mood: neg screening   4. Hearing:  No problems noted but reports mild decrease  Still  has B tinnitus steady , at some point was seen by audiologist, was offered an aid.   5. ADL's: independent, drives  6. Fall Risk: no recent falls, prevention discussed , see AVS 7. home Safety: does feel safe at home  8. Height, weight, & visual acuity: see VS, sees eye doctor regulalrly, last visit ~ 5 months ago 9. Counseling: provided 10. Labs ordered based on risk factors: if needed  11. Referral Coordination: if needed 12. Care Plan, see assessment and plan , written personalized plan provided , see AVS 13. Cognitive Assessment: motor skills and cognition appropriate for age 43. Care team updated 15. End-of-life care, has a HC-POA  In addition, today we discussed the following: Was seen at the ER 07/22/2014 with a near syncope. Workup was negative including negative troponin and EKG at baseline. No further events. He admits to occasional dizziness if he turns certain ways, that is going on for years. Hypertension: BP today in the low side but in the ambulatory setting is in the 130s. High cholesterol: on  Zocor and Niaspan, no apparent side effects.   Review of Systems Constitutional: No fever. No chills. No unexplained wt changes. No unusual sweats  HEENT: No dental problems, no ear discharge, no facial swelling, no voice changes. No eye discharge, no eye  redness , no  intolerance to light   Respiratory: No wheezing , no  difficulty breathing. No cough , no mucus production  Cardiovascular: No CP, no  Palpitations . Occasionally wakes up in the middle of the night short of breath, last few seconds  (paroxysmal nocturnal dyspnea ?). Some lower extremity edema, no orthopnea  GI: no nausea, no vomiting, occasional mild diarrhea 2 or 3 times a month., no  abdominal pain.  No blood in the stools. No dysphagia, no odynophagia    Endocrine: No polyphagia, no polyuria , no polydipsia  GU: No dysuria, gross hematuria, difficulty urinating. Has nocturia 2 or 3 times per night , no other symptoms  Musculoskeletal: No joint swellings or unusual aches or pains  Skin: No change in the color of the skin, palor , no  Rash  Allergic, immunologic: No environmental allergies , no  food allergies  Neurological: Occasional headache described as sinus congestion.. No diplopia, no slurred, no slurred speech, no motor deficits, no facial  Numbness  Hematological: No enlarged lymph nodes, no easy bruising , no unusual bleedings  Psychiatry: No suicidal ideas, no hallucinations, no beavior problems, no confusion.  No unusual/severe anxiety, no depression   Past Medical History  Diagnosis Date  . Anxiety and depression   . Hyperlipidemia   . Hypertension   . Diabetes mellitus   . GERD (gastroesophageal reflux disease)   . PUD (peptic ulcer disease) 7/11    EGD showed few small ulcers, esophagues strecthed  . Headache(784.0) 06/2003    s/p neuro eval ? migraine  . CAD (coronary artery disease)   . Hyperplastic colon polyp 2008  . Iron deficiency     h/o  . Arthritis   . Ischemic cardiomyopathy  EF had 30-35%. The most recent EF was 50% on echo in september 2009  . Allergic rhinitis     Past Surgical History  Procedure Laterality Date  . Coronary artery bypass graft  2004    LIMA to the LADs, sequentail SVG to intermediated and obtuse marginal, sequential SVG to PDA and posterolaterla  . Hernia repair      inguinal  . Cataract extraction Right 07-2012    Social History   Social History  . Marital Status: Married    Spouse Name: N/A  . Number of Children: 0  . Years of Education:  N/A   Occupational History  . retired Freight forwarder     Social History Main Topics  . Smoking status: Former Smoker    Quit date: 12/16/1976  . Smokeless tobacco: Never Used  . Alcohol Use: No  . Drug Use: No  . Sexual Activity: Not on file   Other Topics Concern  . Not on file   Social History Narrative   Married , lives w/ wife , no children           Family History  Problem Relation Age of Onset  . Diabetes Father   . Colon cancer Neg Hx   . Prostate cancer Neg Hx   . Heart attack Mother   . Heart attack Father       Medication List       This list is accurate as of: 11/02/14  7:01 PM.  Always use your most recent med list.               aspirin 81 MG chewable tablet  2 daily.     co-enzyme Q-10 50 MG capsule  Take 400 mg by mouth daily.     cyanocobalamin 100 MCG tablet  Take 1,000 mcg by mouth daily.     enalapril 5 MG tablet  Commonly known as:  VASOTEC  TAKE 1 TABLET BY MOUTH TWICE A DAY AS DIRECTED     fish oil-omega-3 fatty acids 1000 MG capsule  Take 2 g by mouth daily.     FLAX SEED OIL PO  Take by mouth.     metoprolol 100 MG tablet  Commonly known as:  LOPRESSOR  Take 1 tablet (100 mg total) by mouth 2 (two) times daily.     niacin 500 MG tablet  Take 1,500 mg by mouth at bedtime.     omeprazole 40 MG capsule  Commonly known as:  PRILOSEC  Take 1 capsule (40 mg total) by mouth daily.     simvastatin 80 MG tablet  Commonly known as:  ZOCOR  Take 1 tablet (80 mg total) by mouth at bedtime.     TYLENOL 8 HOUR PO  Take 1 tablet by mouth as needed.           Objective:   Physical Exam BP 102/68 mmHg  Pulse 58  Temp(Src) 97.5 F (36.4 C) (Oral)  Ht 5\' 10"  (1.778 m)  Wt 212 lb 6 oz (96.333 kg)  BMI 30.47 kg/m2  SpO2 99% General:   Well developed, well nourished . NAD.  HEENT:  Normocephalic . Face symmetric, atraumatic Neck: No thyromegaly Lungs:  CTA B Normal respiratory effort, no intercostal retractions, no accessory  muscle use. Heart: RRR,  no murmur.  no pretibial edema bilaterally  Abdomen:  Not distended, soft, non-tender. No rebound or rigidity. No mass,organomegaly Skin: Not pale. Not jaundice Rectal:  External abnormalities: none. Normal sphincter tone. No rectal  masses or tenderness.  Stool brown  Prostate: Prostate gland firm and smooth, no enlargement, nodularity, tenderness, mass, asymmetry or induration.  Neurologic:  alert & oriented X3.  Speech normal, gait appropriate for age and unassisted Psych--  Cognition and judgment appear intact.  Cooperative with normal attention span and concentration.  Behavior appropriate. No anxious or depressed appearing.    Assessment & Plan:   Primary care problem list: CV: CAD, carotid artery disease Hypertension Hyperlipidemia Diabetes with neuropathy Chronic renal insufficiency Hypogonadism Anxiety, depression, insomnia Chronic fatigue DJD Thrombocytopenia History of a neck mass, evaluated by ENT remotely    Assessment and plan: CAD : Went to the ER 07/2014 with near syncope. No further events, he has chronic dizziness seems positional. BP today low but usually in the 130s. Plan: ECHO cardiogram, BNP (question of PND), cardiology referral. Carotid disease: Ultrasound done 11-01-14: Bilateral stenosis 40-59%. Follow-up one year Hypertension: BP today slightly low but at home is okay. Continue monitoring BPs, recent BMP satisfactory Hyperlipidemia: Continue simvastatin, check FLP. Recent LFTs normal Diabetes: A1c's has been stable, recheck on return to the office History of thrombocytopenia: CBC few months ago show a platelet of 142, stable. Hemoglobin was a slightly decreased. Will recheck.

## 2014-11-02 NOTE — Assessment & Plan Note (Addendum)
Td 09 ; pneumonia shot  2013 ; prevnar 2015; Shingles Shot--2016, had a flu shot CCS:   colonoscopy 03/2006, 2 hyperplastic polyps, Next 10 years  DRE normal today, checking a PSA. He has mild LUTS (nocturia) BPH?Marland Kitchen Will check a UA. Consider Flomax  Exercise And diet discussed.

## 2014-11-02 NOTE — Progress Notes (Signed)
Pre visit review using our clinic review tool, if applicable. No additional management support is needed unless otherwise documented below in the visit note. 

## 2014-11-02 NOTE — Assessment & Plan Note (Signed)
CAD : Went to the ER 07/2014 with near syncope. No further events, he has chronic dizziness seems positional. BP today low but usually in the 130s. Plan: ECHO cardiogram, BNP (question of PND), cardiology referral. Carotid disease: Ultrasound done 11-01-14: Bilateral stenosis 40-59%. Follow-up one year Hypertension: BP today slightly low but at home is okay. Continue monitoring BPs, recent BMP satisfactory Hyperlipidemia: Continue simvastatin, check FLP. Recent LFTs normal Diabetes: A1c's has been stable, recheck on return to the office History of thrombocytopenia: CBC few months ago show a platelet of 142, stable. Hemoglobin was a slightly decreased. Will recheck.

## 2014-11-03 ENCOUNTER — Other Ambulatory Visit (INDEPENDENT_AMBULATORY_CARE_PROVIDER_SITE_OTHER): Payer: Commercial Managed Care - HMO

## 2014-11-03 DIAGNOSIS — D649 Anemia, unspecified: Secondary | ICD-10-CM | POA: Diagnosis not present

## 2014-11-03 DIAGNOSIS — D509 Iron deficiency anemia, unspecified: Secondary | ICD-10-CM

## 2014-11-03 LAB — FERRITIN: Ferritin: 11.4 ng/mL — ABNORMAL LOW (ref 22.0–322.0)

## 2014-11-03 LAB — IRON: Iron: 38 ug/dL — ABNORMAL LOW (ref 42–165)

## 2014-11-09 ENCOUNTER — Encounter: Payer: Self-pay | Admitting: Internal Medicine

## 2014-11-10 ENCOUNTER — Encounter: Payer: Self-pay | Admitting: Physician Assistant

## 2014-11-10 ENCOUNTER — Telehealth: Payer: Self-pay | Admitting: Internal Medicine

## 2014-11-10 NOTE — Telephone Encounter (Signed)
Pt has appt is 11/30/14 9:30 with [facility], please get auth for GI from [facility].

## 2014-11-30 ENCOUNTER — Other Ambulatory Visit: Payer: Commercial Managed Care - HMO

## 2014-11-30 ENCOUNTER — Encounter: Payer: Self-pay | Admitting: Physician Assistant

## 2014-11-30 ENCOUNTER — Other Ambulatory Visit: Payer: Self-pay | Admitting: *Deleted

## 2014-11-30 ENCOUNTER — Ambulatory Visit (INDEPENDENT_AMBULATORY_CARE_PROVIDER_SITE_OTHER): Payer: Commercial Managed Care - HMO | Admitting: Physician Assistant

## 2014-11-30 ENCOUNTER — Other Ambulatory Visit (INDEPENDENT_AMBULATORY_CARE_PROVIDER_SITE_OTHER): Payer: Commercial Managed Care - HMO

## 2014-11-30 VITALS — BP 152/70 | HR 64 | Ht 68.0 in | Wt 211.1 lb

## 2014-11-30 DIAGNOSIS — D509 Iron deficiency anemia, unspecified: Secondary | ICD-10-CM

## 2014-11-30 LAB — CBC WITH DIFFERENTIAL/PLATELET
BASOS ABS: 0.1 10*3/uL (ref 0.0–0.1)
Basophils Relative: 0.8 % (ref 0.0–3.0)
Eosinophils Absolute: 0.7 10*3/uL (ref 0.0–0.7)
Eosinophils Relative: 10.2 % — ABNORMAL HIGH (ref 0.0–5.0)
HCT: 40.2 % (ref 39.0–52.0)
Hemoglobin: 13 g/dL (ref 13.0–17.0)
LYMPHS PCT: 25.6 % (ref 12.0–46.0)
Lymphs Abs: 1.8 10*3/uL (ref 0.7–4.0)
MCHC: 32.4 g/dL (ref 30.0–36.0)
MCV: 79.9 fl (ref 78.0–100.0)
MONOS PCT: 11.3 % (ref 3.0–12.0)
Monocytes Absolute: 0.8 10*3/uL (ref 0.1–1.0)
NEUTROS ABS: 3.7 10*3/uL (ref 1.4–7.7)
Neutrophils Relative %: 52.1 % (ref 43.0–77.0)
Platelets: 169 10*3/uL (ref 150.0–400.0)
RBC: 5.03 Mil/uL (ref 4.22–5.81)
RDW: 15.9 % — ABNORMAL HIGH (ref 11.5–15.5)
WBC: 7 10*3/uL (ref 4.0–10.5)

## 2014-11-30 MED ORDER — INTEGRA PLUS PO CAPS: 1.0000 | ORAL_CAPSULE | Freq: Every day | ORAL | Status: DC

## 2014-11-30 NOTE — Progress Notes (Signed)
i agree with the above note, plan 

## 2014-11-30 NOTE — Patient Instructions (Signed)
Please go to the basement level to have your labs drawn and stool test. We sent a prescription for Integra plus iron supplement to [facility] [place].  We will decide about procedures after you see cardiology.

## 2014-11-30 NOTE — Progress Notes (Signed)
Patient ID: [name] Little, male   DOB: 1940-12-30, 74 y.o.   MRN: [id number]   Subjective:    Patient ID: [name] Little, male    DOB: 1940-05-09, 74 y.o.   MRN: [id number]  HPI  [name] Little is a pleasant 74 year old white male known previously to Dr. Ardis Hughs and referred today by Dr. Dia Crawford for evaluation of iron deficiency anemia found with recent physical exam. Patient had undergone colonoscopy in October 2008 for mild iron deficiency anemia. He was found to have just 2 small polyps which were removed and found to be hyperplastic. He had EGD in July 2011 which showed several small prepyloric ulcers minimal peptic stricture which was dilated. Patient does have history of adult-onset diabetes mellitus, coronary artery disease status post CABG and an ischemic cardiomyopathy with EF between 45-50% last documented several years ago. He also has carotid stenosis, and hypertension. He is not anticoagulated, does take baby aspirin daily. Patient has no current GI symptoms. He says he has had problems with fatigue over the past year and is not sleeping well at night area and his appetite is fine his weight has been stable he has no abdominal pain no changes in his bowel habits no melena or hematochezia. No heartburn indigestion dysphagia etc. His stool has not been hemocculted. Labs 11/02/2014 hemoglobin 12.4 hematocrit of 38.2 MCV of 80 ferritin 11.4, and serum iron 38.  He states that he will have to get a divorce if he has a colonoscopy because his wife knows 2 people who died as a result of colonoscopies.  Review of Systems Pertinent positive and negative review of systems were noted in the above HPI section.  All other review of systems was otherwise negative.  Outpatient Encounter Prescriptions as of 11/30/2014  Medication Sig  . Acetaminophen (TYLENOL 8 HOUR PO) Take 1 tablet by mouth as needed.  Marland Kitchen aspirin 81 MG chewable tablet 2 daily.   Marland Kitchen co-enzyme Q-10 50 MG capsule Take 400 mg by  mouth daily.   . cyanocobalamin 100 MCG tablet Take 1,000 mcg by mouth daily.  . enalapril (VASOTEC) 5 MG tablet TAKE 1 TABLET BY MOUTH TWICE A DAY AS DIRECTED  . fish oil-omega-3 fatty acids 1000 MG capsule Take 2 g by mouth daily.    . Flaxseed, Linseed, (FLAX SEED OIL PO) Take by mouth.   . metoprolol (LOPRESSOR) 100 MG tablet Take 1 tablet (100 mg total) by mouth 2 (two) times daily.  . niacin 500 MG tablet Take 1,500 mg by mouth at bedtime.   Marland Kitchen omeprazole (PRILOSEC) 40 MG capsule Take 1 capsule (40 mg total) by mouth daily.  . simvastatin (ZOCOR) 80 MG tablet Take 1 tablet (80 mg total) by mouth at bedtime.  . FeFum-FePoly-FA-B Cmp-C-Biot (INTEGRA PLUS) CAPS Take 1 capsule by mouth daily.   No facility-administered encounter medications on file as of 11/30/2014.   Allergies  Allergen Reactions  . Micardis [Telmisartan]     REACTION: diarrhea   Patient Active Problem List   Diagnosis Date Noted  . Follow up--- PCP notes 11/02/2014  . Fatigue-- sleepy 04/20/2014  . Eye fatigue 12/07/2013  . Carotid arterial disease 10/18/2013  . Sinusitis, chronic  (ill defined L face pressure) 02/05/2012  . Annual physical exam 08/21/2010  . RENAL INSUFFICIENCY, CHRONIC 04/26/2010  . ALLERGIC RHINITIS 04/23/2010  . NECK MASS 07/25/2009  . Diabetes mellitus with neuropathy 11/15/2008  . THROMBOCYTOPENIA 06/06/2008  . HYPOGONADISM, MALE 02/04/2008  . CAD 06/01/2007  . DJD (  degenerative joint disease) 06/01/2007  . Hyperlipidemia 06/24/2006  . Anxiety-depression- insomnia 06/24/2006  . Essential hypertension 06/24/2006  . GERD 06/24/2006  . HEADACHE 06/24/2006   Social History   Social History  . Marital Status: Married    Spouse Name: N/A  . Number of Children: 0  . Years of Education: N/A   Occupational History  . retired Freight forwarder     Social History Main Topics  . Smoking status: Former Smoker    Types: Cigarettes    Quit date: 12/16/1976  . Smokeless tobacco: Never Used  .  Alcohol Use: No  . Drug Use: No  . Sexual Activity: Not on file   Other Topics Concern  . Not on file   Social History Narrative   Married , lives w/ wife , no children          [name] Little family history includes Cancer in his paternal grandfather; Diabetes in his father; Heart attack in his father and mother. There is no history of Colon cancer or Prostate cancer.      Objective:    Filed Vitals:   11/30/14 0921  BP: 152/70  Pulse: 64    Physical Exam  well-developed older white male in no acute distress, pleasant blood pressure 152/70 pulse 64 height 5 foot 8 weight 211. HEENT; nontraumatic normocephalic EOMI PERRLA sclera anicteric, Cardiovascular; regular rate and rhythm with S1-S2 is a sternal incisional scar, Pulmonary; clear bilaterally, Abdomen; soft nontender nondistended bowel sounds are active there is no palpable mass or hepatosplenomegaly, Rectal; exam not done, Extremities; no clubbing cyanosis or edema skin warm and dry, Neuropsych ;mood and affect appropriate       Assessment & Plan:   #1 74 yo male with new iron deficiency anemia, asymptomatic- r/o occult Gi Blood loss, upper or lower source #2 CAD-s/p CABG  #3 ischemic cardiomyopathy- cardiology eval pending with repeat echo #4 GERD-hx of peptic stricture #5AODM #6 hx of hyperplastic polyps 2008 #7 carotid stenosis  Plan; check IFOB Repeat cbc  Start Integra plus one daily Pt will proceed with planned cariology eval, and echo-then when cleared by cardiology agreeable to schedule for Colon and EGD with Dr Herma Mering PA-C 11/30/2014   Cc: Colon Branch, MD

## 2014-12-01 LAB — FECAL OCCULT BLOOD, IMMUNOCHEMICAL: FECAL OCCULT BLOOD: NEGATIVE

## 2014-12-22 ENCOUNTER — Ambulatory Visit (INDEPENDENT_AMBULATORY_CARE_PROVIDER_SITE_OTHER): Payer: Commercial Managed Care - HMO | Admitting: Medical

## 2014-12-22 ENCOUNTER — Telehealth: Payer: Self-pay

## 2014-12-22 ENCOUNTER — Encounter: Payer: Self-pay | Admitting: Medical

## 2014-12-22 ENCOUNTER — Other Ambulatory Visit: Payer: Self-pay | Admitting: Internal Medicine

## 2014-12-22 ENCOUNTER — Other Ambulatory Visit: Payer: Self-pay

## 2014-12-22 ENCOUNTER — Ambulatory Visit: Payer: Commercial Managed Care - HMO

## 2014-12-22 ENCOUNTER — Ambulatory Visit (HOSPITAL_COMMUNITY): Payer: Commercial Managed Care - HMO | Attending: Internal Medicine

## 2014-12-22 ENCOUNTER — Telehealth: Payer: Self-pay | Admitting: Internal Medicine

## 2014-12-22 VITALS — BP 161/67 | HR 70 | Temp 98.6°F

## 2014-12-22 VITALS — BP 155/85 | HR 79 | Temp 97.8°F | Resp 16 | Ht 68.0 in | Wt 209.0 lb

## 2014-12-22 DIAGNOSIS — I10 Essential (primary) hypertension: Secondary | ICD-10-CM | POA: Diagnosis not present

## 2014-12-22 DIAGNOSIS — I341 Nonrheumatic mitral (valve) prolapse: Secondary | ICD-10-CM | POA: Diagnosis not present

## 2014-12-22 DIAGNOSIS — R55 Syncope and collapse: Secondary | ICD-10-CM

## 2014-12-22 DIAGNOSIS — Z951 Presence of aortocoronary bypass graft: Secondary | ICD-10-CM | POA: Diagnosis not present

## 2014-12-22 DIAGNOSIS — R5383 Other fatigue: Secondary | ICD-10-CM | POA: Diagnosis not present

## 2014-12-22 DIAGNOSIS — I517 Cardiomegaly: Secondary | ICD-10-CM | POA: Insufficient documentation

## 2014-12-22 DIAGNOSIS — I2510 Atherosclerotic heart disease of native coronary artery without angina pectoris: Secondary | ICD-10-CM | POA: Diagnosis not present

## 2014-12-22 DIAGNOSIS — E119 Type 2 diabetes mellitus without complications: Secondary | ICD-10-CM | POA: Insufficient documentation

## 2014-12-22 NOTE — Telephone Encounter (Signed)
Relation to JG:ZQJS Call back number: [phone]    Reason for call:  Patient had a ECHOCARDIOGRAM done today and before the procedure patient BP was taken and it was 177/89. Patient stated BP was not checked after procedure. Please advise   BP 177/89

## 2014-12-22 NOTE — Telephone Encounter (Signed)
Patient called regarding BP being elevated at Echocardiogram procedure. States there was no recheck after his procedure. Patient placed on schedule for BP check today.

## 2014-12-22 NOTE — Patient Instructions (Addendum)
Your blood pressure is elevated today when I checked as well as when others checked. Earlier today you got elevated reading. I want you to take lopressor the same as before. With your enalapril I want you to increase that to 1.5 tab  twice a day. Check your bp daily and document your readings.(if by Sunday your bp not less than 140/90 then take 2 tab twice daily of enalapril).  If you get high bp readings with neurologic type signs or symptoms then ED evaluation.  Follow up here in 10 days or as needed. You could call us on Monday or Tuesday update on bp level.  Follow up with cardiologsit as well on this coming Thursday get there advise on bp med tx.  For fatigue will get cbc and cmp.

## 2014-12-22 NOTE — Progress Notes (Signed)
Pre visit review using our clinic review tool, if applicable. No additional management support is needed unless otherwise documented below in the visit note.  Patient in for BP check. States he was in today having Echocardiogram BP was 177/89. States follow up pressure was not done when procedure was over.

## 2014-12-22 NOTE — Progress Notes (Signed)
Pre visit review using our clinic review tool, if applicable. No additional management support is needed unless otherwise documented below in the visit note. 

## 2014-12-22 NOTE — Progress Notes (Signed)
Subjective:    Patient ID: [name] Little, male    DOB: October 23, 1940, 74 y.o.   MRN: [id number]  HPI  Pt states in May his bp was 170/100(Approximate).   This morning he went to get echocardiogram and his bp 177/89. Pt did not feel nervous per his report.  Pt states he feels mild sleepy all day. Pt does not/did not  have ha, chest pain, no sob, no gross motor or sensory function deficits. No dizziness.  Pt states for several years has been struggling with fatigue.  Pt states before he came here at home 155/89. Pt is currently on Enalapril 5 mg bid. Also on b blocker bid.    Review of Systems  Constitutional: Positive for fatigue. Negative for fever, chills, diaphoresis and activity change.  Respiratory: Negative for cough, chest tightness and shortness of breath.   Cardiovascular: Negative for chest pain, palpitations and leg swelling.  Gastrointestinal: Negative for nausea, vomiting and abdominal pain.  Musculoskeletal: Negative for neck pain and neck stiffness.  Neurological: Negative for dizziness, tremors, seizures, syncope, facial asymmetry, speech difficulty, weakness, light-headedness, numbness and headaches.  Psychiatric/Behavioral: Negative for behavioral problems, confusion and agitation. The patient is not nervous/anxious.     Past Medical History  Diagnosis Date  . Anxiety and depression   . Hyperlipidemia   . Hypertension   . Diabetes mellitus   . GERD (gastroesophageal reflux disease)   . PUD (peptic ulcer disease) 7/11    EGD showed few small ulcers, esophagues strecthed  . Headache(784.0) 06/2003    s/p neuro eval ? migraine  . CAD (coronary artery disease)   . Hyperplastic colon polyp 2008  . Iron deficiency     h/o  . Arthritis   . Ischemic cardiomyopathy     EF had 30-35%. The most recent EF was 50% on echo in september 2009  . Allergic rhinitis     Social History   Social History  . Marital Status: Married    Spouse Name: N/A  . Number  of Children: 0  . Years of Education: N/A   Occupational History  . retired Freight forwarder     Social History Main Topics  . Smoking status: Former Smoker    Types: Cigarettes    Quit date: 12/16/1976  . Smokeless tobacco: Never Used  . Alcohol Use: No  . Drug Use: No  . Sexual Activity: Not on file   Other Topics Concern  . Not on file   Social History Narrative   Married , lives w/ wife , no children          Past Surgical History  Procedure Laterality Date  . Coronary artery bypass graft  2004    LIMA to the LADs, sequentail SVG to intermediated and obtuse marginal, sequential SVG to PDA and posterolaterla  . Inguinal hernia repair Bilateral   . Cataract extraction Right 07-2012  . Tonsillectomy      Family History  Problem Relation Age of Onset  . Diabetes Father   . Colon cancer Neg Hx   . Prostate cancer Neg Hx   . Heart attack Mother   . Heart attack Father   . Cancer Paternal Grandfather     type unknown    Allergies  Allergen Reactions  . Micardis [Telmisartan]     REACTION: diarrhea    Current Outpatient Prescriptions on File Prior to Visit  Medication Sig Dispense Refill  . Acetaminophen (TYLENOL 8 HOUR PO) Take 1 tablet by mouth  as needed.    Marland Kitchen aspirin 81 MG chewable tablet 2 daily.     Marland Kitchen co-enzyme Q-10 50 MG capsule Take 400 mg by mouth daily.     . cyanocobalamin 100 MCG tablet Take 1,000 mcg by mouth daily.    . enalapril (VASOTEC) 5 MG tablet TAKE 1 TABLET BY MOUTH TWICE A DAY AS DIRECTED 60 tablet 11  . FeFum-FePoly-FA-B Cmp-C-Biot (INTEGRA PLUS) CAPS Take 1 capsule by mouth daily. 30 capsule 6  . fish oil-omega-3 fatty acids 1000 MG capsule Take 2 g by mouth daily.      . Flaxseed, Linseed, (FLAX SEED OIL PO) Take by mouth.     . metoprolol (LOPRESSOR) 100 MG tablet Take 1 tablet (100 mg total) by mouth 2 (two) times daily. 60 tablet 6  . niacin 500 MG tablet Take 1,500 mg by mouth at bedtime.     Marland Kitchen omeprazole (PRILOSEC) 40 MG capsule Take 1  capsule (40 mg total) by mouth daily. 30 capsule 12  . simvastatin (ZOCOR) 80 MG tablet Take 1 tablet (80 mg total) by mouth at bedtime. 90 tablet 1   No current facility-administered medications on file prior to visit.    BP 155/85 mmHg  Pulse 79  Temp(Src) 97.8 F (36.6 C) (Oral)  Resp 16  Ht 5\' 8"  (1.727 m)  Wt 209 lb (94.802 kg)  BMI 31.79 kg/m2  SpO2 98%       Objective:   Physical Exam  General Mental Status- Alert. General Appearance- Not in acute distress.   Skin General: Color- Normal Color. Moisture- Normal Moisture.  Neck Carotid Arteries- Normal color. Moisture- Normal Moisture. No carotid bruits. No JVD.  Chest and Lung Exam Auscultation: Breath Sounds:-Normal.  Cardiovascular Auscultation:Rythm- Regular. Murmurs & Other Heart Sounds:Auscultation of the heart reveals- No Murmurs.  Abdomen Inspection:-Inspeection Normal. Palpation/Percussion:Note:No mass. Palpation and Percussion of the abdomen reveal- Non Tender, Non Distended + BS, no rebound or guarding.  Neurologic Cranial Nerve exam:- CN Little-XII intact(No nystagmus), symmetric smile. Finger to Nose:- Normal/Intact Strength:- 5/5 equal and symmetric strength both upper and lower extremities.      Assessment & Plan:  Your blood pressure is elevated today when I checked as well as when others checked. Earlier today you got elevated reading. I want you to take lopressor the same as before. With your enalapril I want you to increase that to 1.5 tab  twice a day. Check your bp daily and document your readings.(if by Sunday your bp not less than 140/90 then take 2 tab twice daily of enalapril).  If you get high bp readings with neurologic type signs or symptoms then ED evaluation.  Follow up here in 10 days or as needed. You could call us on Monday or Tuesday update on bp level.  Follow up with cardiologsit as well get there advise on bp med tx.  For fatigue will get cbc and cmp.

## 2014-12-22 NOTE — Patient Instructions (Signed)
Appointment taken 2 times. Appointment scheduled with Mackie Pai for today. BP is lower,however it has not come down below 161/67

## 2014-12-23 ENCOUNTER — Telehealth: Payer: Self-pay | Admitting: Internal Medicine

## 2014-12-23 ENCOUNTER — Telehealth: Payer: Self-pay | Admitting: Physician Assistant

## 2014-12-23 ENCOUNTER — Encounter: Payer: Self-pay | Admitting: Internal Medicine

## 2014-12-23 LAB — COMPREHENSIVE METABOLIC PANEL
ALBUMIN: 4.5 g/dL (ref 3.5–5.2)
ALK PHOS: 53 U/L (ref 39–117)
ALT: 28 U/L (ref 0–53)
AST: 25 U/L (ref 0–37)
BILIRUBIN TOTAL: 0.6 mg/dL (ref 0.2–1.2)
BUN: 17 mg/dL (ref 6–23)
CO2: 27 mEq/L (ref 19–32)
Calcium: 9.8 mg/dL (ref 8.4–10.5)
Chloride: 102 mEq/L (ref 96–112)
Creatinine, Ser: 1.21 mg/dL (ref 0.40–1.50)
GFR: 62.12 mL/min (ref >60.00)
GLUCOSE: 126 mg/dL — AB (ref 70–99)
POTASSIUM: 4.4 meq/L (ref 3.5–5.1)
Sodium: 138 mEq/L (ref 135–145)
TOTAL PROTEIN: 7.3 g/dL (ref 6.0–8.3)

## 2014-12-23 LAB — CBC WITH DIFFERENTIAL/PLATELET
BASOS ABS: 0.1 10*3/uL (ref 0.0–0.1)
Basophils Relative: 1.8 % (ref 0.0–3.0)
EOS PCT: 6.1 % — AB (ref 0.0–5.0)
Eosinophils Absolute: 0.4 10*3/uL (ref 0.0–0.7)
HCT: 45.2 % (ref 39.0–52.0)
HEMOGLOBIN: 14.5 g/dL (ref 13.0–17.0)
LYMPHS ABS: 1.6 10*3/uL (ref 0.7–4.0)
Lymphocytes Relative: 23.4 % (ref 12.0–46.0)
MCHC: 32.1 g/dL (ref 30.0–36.0)
MCV: 85.9 fl (ref 78.0–100.0)
MONOS PCT: 8.4 % (ref 3.0–12.0)
Monocytes Absolute: 0.6 10*3/uL (ref 0.1–1.0)
NEUTROS PCT: 60.3 % (ref 43.0–77.0)
Neutro Abs: 4.1 10*3/uL (ref 1.4–7.7)
Platelets: 158 10*3/uL (ref 150.0–400.0)
RBC: 5.26 Mil/uL (ref 4.22–5.81)
RDW: 22.3 % — ABNORMAL HIGH (ref 11.5–15.5)
WBC: 6.8 10*3/uL (ref 4.0–10.5)

## 2014-12-23 MED ORDER — TEMAZEPAM 7.5 MG PO CAPS: 7.5000 mg | ORAL_CAPSULE | Freq: Every evening | ORAL | Status: DC | PRN

## 2014-12-23 NOTE — Addendum Note (Signed)
Addended by: Kathlene November E on: 12/23/2014 05:15 PM   Modules accepted: Orders

## 2014-12-23 NOTE — Telephone Encounter (Addendum)
I haven't called him but he sent a message stating that he can't sleep. I review the chart and based on that information I recommend  - temazepam 7.5 mg one or 2 tablets at bedtime as needed. Prescription printed. If not improving, arrange a visit for 10 days.

## 2014-12-23 NOTE — Telephone Encounter (Signed)
Have not tried calling.   Dr. Larose Kells?

## 2014-12-23 NOTE — Telephone Encounter (Signed)
Seen by Percell Miller on 12/22/2014.

## 2014-12-23 NOTE — Telephone Encounter (Signed)
He was supposed to see cardiology, and have repeat echo etc , then when cleared schedule Colon /EGD. I see the Echo id done and EF stable- His hgb is improving as replace iron... Stool would recommend Colon /EGD.Marland Kitchen Find out when he is seeing cardiology

## 2014-12-23 NOTE — Telephone Encounter (Signed)
Pt states that he is returning a call but did not know who it was to. Please call him back or release to myChart per his request.

## 2014-12-23 NOTE — Telephone Encounter (Signed)
Patient seen here for hem positive stool and anemia. His hgb/hct have improved greatly on Integra. Please advise.

## 2014-12-23 NOTE — Telephone Encounter (Signed)
I called pt back after getting his labs back.Went over/left results on machine. I explained never found out who had called him from our office but since I called him regarding that I did briefly explain to him labs were fairly well except mild sugar elevation. Asked him to update [place] on his bp early next week. Those reading could be relayed to his pcp Dr. Larose Kells. If he has any questions asked him to call back here before 4:45.

## 2014-12-23 NOTE — Telephone Encounter (Signed)
Pt called stating someone called him. I reviewed notes and did not see any attempted correspondence. Dr. Larose Kells is his pcp. Thought you should be aware.

## 2014-12-23 NOTE — Telephone Encounter (Signed)
LMOM informing Pt that we received MyChart message regarding having trouble sleeping. Informed Pt to start Temazepam/Restoril 1-2 tablets by mouth daily at bedtime for sleep. Rx faxed to [facility].

## 2014-12-25 ENCOUNTER — Encounter: Payer: Self-pay | Admitting: Medical

## 2014-12-26 ENCOUNTER — Telehealth: Payer: Self-pay | Admitting: Internal Medicine

## 2014-12-26 MED ORDER — LORAZEPAM 0.5 MG PO TABS
0.5000 mg | ORAL_TABLET | Freq: Every evening | ORAL | Status: DC | PRN
Start: 2014-12-26 — End: 2015-02-22

## 2014-12-26 NOTE — Telephone Encounter (Signed)
Ok, ask him to call back after the cardiology appt and I will review their recommendations then can schedule

## 2014-12-26 NOTE — Telephone Encounter (Signed)
Cardiology appointment is 12/30/14. He states he has a blood pressure problem. Advised him to continue the oral iron for now.

## 2014-12-26 NOTE — Telephone Encounter (Signed)
Temazepam d/c.

## 2014-12-26 NOTE — Telephone Encounter (Signed)
Insomnia: Reports recently recommend to take temazepam, reports she is very expensive. Change to lorazepam, prescription printed. Also, he sents a message regards HTN , it was confused about it so spoke with the patient: He plans to increase enalapril to 2 tablets twice a day. His BP last night was 143/87. I asked the patient to call the office rather than send several emails to improve communication. Plan: Follow-up here next week. We will call and make an appointment.

## 2014-12-26 NOTE — Telephone Encounter (Signed)
Lorazepam Rx faxed to [facility].

## 2014-12-26 NOTE — Telephone Encounter (Signed)
See phone note from 12/26/2014 for further details.

## 2014-12-26 NOTE — Telephone Encounter (Signed)
See phone note 12/26/2014 for further details.

## 2014-12-28 ENCOUNTER — Encounter: Payer: Self-pay | Admitting: Internal Medicine

## 2014-12-29 ENCOUNTER — Ambulatory Visit (INDEPENDENT_AMBULATORY_CARE_PROVIDER_SITE_OTHER): Payer: Commercial Managed Care - HMO | Admitting: Cardiology

## 2014-12-29 ENCOUNTER — Encounter: Payer: Self-pay | Admitting: Cardiology

## 2014-12-29 VITALS — BP 122/72 | HR 62 | Ht 68.0 in | Wt 203.0 lb

## 2014-12-29 DIAGNOSIS — I10 Essential (primary) hypertension: Secondary | ICD-10-CM

## 2014-12-29 DIAGNOSIS — I779 Disorder of arteries and arterioles, unspecified: Secondary | ICD-10-CM

## 2014-12-29 DIAGNOSIS — I739 Peripheral vascular disease, unspecified: Principal | ICD-10-CM

## 2014-12-29 NOTE — Patient Instructions (Signed)
Your physician wants you to follow-up in: 1 Year. You will receive a reminder letter in the mail two months in advance. If you don't receive a letter, please call our office to schedule the follow-up appointment.  Your physician has requested that you have a carotid duplex. This test is an ultrasound of the carotid arteries in your neck. It looks at blood flow through these arteries that supply the brain with blood. Allow one hour for this exam. There are no restrictions or special instructions.  Your physician has recommended you make the following change in your medication: STOP Niacin

## 2014-12-29 NOTE — Progress Notes (Signed)
HPI The patient returns for one year follow up.  He was recently having some problems with dizziness. He actually went to the emergency room in May and I reviewed these records. It seems he's been hypertensive which may be related to some of these symptoms. Recently he had his enalapril increased his blood pressures better. He also wasn't sleeping he's been given some sleep medicines which seems to help. He says with these interventions feels better. Of note he did have a recent echocardiogram. His EF was slightly low at 45-50%. There were no other significant abnormalities. I don't have a baseline echocardiogram for ejection fraction prior to this. He's able to be active.  The patient denies any new symptoms such as chest discomfort, neck or arm discomfort. There has been no new shortness of breath, PND or orthopnea. There have been no reported palpitations, presyncope or syncope.   Allergies  Allergen Reactions  . Micardis [Telmisartan]     REACTION: diarrhea    Current Outpatient Prescriptions  Medication Sig Dispense Refill  . Acetaminophen (TYLENOL 8 HOUR PO) Take 1 tablet by mouth as needed.    Marland Kitchen aspirin 81 MG chewable tablet 2 daily.     Marland Kitchen co-enzyme Q-10 50 MG capsule Take 400 mg by mouth daily.     . cyanocobalamin 100 MCG tablet Take 1,000 mcg by mouth daily.    . enalapril (VASOTEC) 5 MG tablet Take 10 mg by mouth 2 (two) times daily.    Marland Kitchen FeFum-FePoly-FA-B Cmp-C-Biot (INTEGRA PLUS) CAPS Take 1 capsule by mouth daily. 30 capsule 6  . fish oil-omega-3 fatty acids 1000 MG capsule Take 2 g by mouth daily.      . Flaxseed, Linseed, (FLAX SEED OIL PO) Take by mouth.     Marland Kitchen LORazepam (ATIVAN) 0.5 MG tablet Take 1-2 tablets (0.5-1 mg total) by mouth at bedtime as needed for anxiety. 40 tablet 0  . metoprolol (LOPRESSOR) 100 MG tablet Take 1 tablet (100 mg total) by mouth 2 (two) times daily. 60 tablet 6  . niacin 500 MG tablet Take 1,500 mg by mouth at bedtime.     Marland Kitchen omeprazole  (PRILOSEC) 40 MG capsule Take 1 capsule (40 mg total) by mouth daily. 30 capsule 12  . simvastatin (ZOCOR) 80 MG tablet Take 1 tablet (80 mg total) by mouth at bedtime. 90 tablet 1   No current facility-administered medications for this visit.    Past Medical History  Diagnosis Date  . Anxiety and depression   . Hyperlipidemia   . Hypertension   . Diabetes mellitus   . GERD (gastroesophageal reflux disease)   . PUD (peptic ulcer disease) 7/11    EGD showed few small ulcers, esophagues strecthed  . Headache(784.0) 06/2003    s/p neuro eval ? migraine  . CAD (coronary artery disease)   . Hyperplastic colon polyp 2008  . Iron deficiency     h/o  . Arthritis   . Ischemic cardiomyopathy     EF had 30-35%. The most recent EF was 50% on echo in september 2009  . Allergic rhinitis     Past Surgical History  Procedure Laterality Date  . Coronary artery bypass graft  2004    LIMA to the LADs, sequentail SVG to intermediated and obtuse marginal, sequential SVG to PDA and posterolaterla  . Inguinal hernia repair Bilateral   . Cataract extraction Right 07-2012  . Tonsillectomy      ROS: As stated in the HPI and negative for all  other systems.  PHYSICAL EXAM BP 122/72 mmHg  Pulse 62  Ht 5\' 8"  (1.727 m)  Wt 203 lb (92.08 kg)  BMI 30.87 kg/m2 GENERAL:  Well appearing HEENT:  Pupils equal round and reactive, fundi not visualized, oral mucosa unremarkable, dentures NECK:  No jugular venous distention, waveform within normal limits, carotid upstroke brisk and symmetric, no bruits, no thyromegaly LUNGS:  Clear to auscultation bilaterally BACK:  No CVA tenderness CHEST:  Unremarkable HEART:  PMI not displaced or sustained,S1 and S2 within normal limits, no S3, no S4, no clicks, no rubs, no murmurs ABD:  Flat, positive bowel sounds normal in frequency in pitch, no bruits, no rebound, no guarding, no midline pulsatile mass, no hepatomegaly, no splenomegaly EXT:  2 plus pulses throughout,  mild edema, no cyanosis no clubbing  EKG:  Sinus bradycardia, rate 57 old inferior infarct, possible old anteroseptal infarct, nonspecific lateral ST flattening. PACs .  Unchanged.   12/29/2014  ASSESSMENT AND PLAN  CAD -  The patient has no new sypmtoms.  No further cardiovascular testing is indicated.  We will continue with aggressive risk reduction and meds as listed.  HYPERTENSION -  The blood pressure is slightly elevated.  No change in medications is indicated. We will continue with therapeutic lifestyle changes (TLC).  HYPERLIPIDEMIA -  He can stop his niacin per FDA recommendations.    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 He will have follow-up scheduled as he is overdue.

## 2014-12-30 MED ORDER — ENALAPRIL MALEATE 10 MG PO TABS: 10.0000 mg | ORAL_TABLET | Freq: Two times a day (BID) | ORAL | Status: DC

## 2014-12-30 NOTE — Telephone Encounter (Signed)
Spoke with pt, he stated the scheduler give him both address for [address] and [address], inform pt that the Carotid doppler test is done at the [facility] office, I also send in new prescription to pt pharmacy 10 mg twice a day

## 2015-01-03 ENCOUNTER — Telehealth: Payer: Self-pay | Admitting: Cardiology

## 2015-01-03 DIAGNOSIS — I739 Peripheral vascular disease, unspecified: Principal | ICD-10-CM

## 2015-01-03 DIAGNOSIS — I779 Disorder of arteries and arterioles, unspecified: Secondary | ICD-10-CM

## 2015-01-03 NOTE — Telephone Encounter (Signed)
Pt called in wanting to know why he is coming in to have another Carotid doppler done on 11/7 if he just had one done on 8/30 and another before that on 2/22. Please f/u with pt to provide some clarity.   Thanks

## 2015-01-03 NOTE — Telephone Encounter (Signed)
Returned call to patient.He stated he had carotid dopplers 11/01/14 and was told to repeat in 1 year.Stated Dr.Hochrein told him to have repeated every 6 months.Advised I will let schedulers know to have done in 04/2015.Also he is scheduled for carotid dopplers 01/09/15.Advised I will cancel carotid dopplers 01/09/15.

## 2015-01-04 ENCOUNTER — Encounter: Payer: Self-pay | Admitting: Internal Medicine

## 2015-01-04 ENCOUNTER — Ambulatory Visit (INDEPENDENT_AMBULATORY_CARE_PROVIDER_SITE_OTHER): Payer: Commercial Managed Care - HMO | Admitting: Internal Medicine

## 2015-01-04 VITALS — BP 126/76 | HR 58 | Temp 98.2°F | Ht 68.0 in | Wt 200.5 lb

## 2015-01-04 DIAGNOSIS — G4700 Insomnia, unspecified: Secondary | ICD-10-CM

## 2015-01-04 DIAGNOSIS — I10 Essential (primary) hypertension: Secondary | ICD-10-CM

## 2015-01-04 DIAGNOSIS — Z09 Encounter for follow-up examination after completed treatment for conditions other than malignant neoplasm: Secondary | ICD-10-CM

## 2015-01-04 LAB — BASIC METABOLIC PANEL
BUN: 28 mg/dL — ABNORMAL HIGH (ref 6–23)
CO2: 27 meq/L (ref 19–32)
Calcium: 9.6 mg/dL (ref 8.4–10.5)
Chloride: 102 mEq/L (ref 96–112)
Creatinine, Ser: 1.25 mg/dL (ref 0.40–1.50)
GFR: 59.82 mL/min — AB (ref >60.00)
GLUCOSE: 104 mg/dL — AB (ref 70–99)
POTASSIUM: 4.5 meq/L (ref 3.5–5.1)
SODIUM: 136 meq/L (ref 135–145)

## 2015-01-04 NOTE — Progress Notes (Signed)
Subjective:    Patient ID: [name] Little, male    DOB: 06-10-1940, 74 y.o.   MRN: [id number]  DOS:  01/04/2015 Type of visit - description : Follow-up Since the last office visit: Workup for syncope was essentially negative, saw cardiology. BP was elevated, enalapril dose increased, good compliance, BPs have decreased from the 140s to 120/70 in the last few days. Insomnia: Currently on Ativan, good control.    Review of Systems Denies chest pain or difficulty breathing. States he is feeling very well at this point.  Past Medical History  Diagnosis Date  . Anxiety and depression   . Hyperlipidemia   . Hypertension   . Diabetes mellitus   . GERD (gastroesophageal reflux disease)   . PUD (peptic ulcer disease) 7/11    EGD showed few small ulcers, esophagues strecthed  . Headache(784.0) 06/2003    s/p neuro eval ? migraine  . CAD (coronary artery disease)   . Hyperplastic colon polyp 2008  . Iron deficiency     h/o  . Arthritis   . Ischemic cardiomyopathy     EF had 30-35%. The most recent EF was 50% on echo in september 2009  . Allergic rhinitis     Past Surgical History  Procedure Laterality Date  . Coronary artery bypass graft  2004    LIMA to the LADs, sequentail SVG to intermediated and obtuse marginal, sequential SVG to PDA and posterolaterla  . Inguinal hernia repair Bilateral   . Cataract extraction Right 07-2012  . Tonsillectomy      Social History   Social History  . Marital Status: Married    Spouse Name: N/A  . Number of Children: 0  . Years of Education: N/A   Occupational History  . retired Freight forwarder     Social History Main Topics  . Smoking status: Former Smoker    Types: Cigarettes    Quit date: 12/16/1976  . Smokeless tobacco: Never Used  . Alcohol Use: No  . Drug Use: No  . Sexual Activity: Not on file   Other Topics Concern  . Not on file   Social History Narrative   Married , lives w/ wife , no children                Medication List       This list is accurate as of: 01/04/15 11:59 PM.  Always use your most recent med list.               aspirin 81 MG chewable tablet  2 daily.     co-enzyme Q-10 50 MG capsule  Take 400 mg by mouth daily.     cyanocobalamin 100 MCG tablet  Take 1,000 mcg by mouth daily.     enalapril 10 MG tablet  Commonly known as:  VASOTEC  Take 1 tablet (10 mg total) by mouth 2 (two) times daily.     fish oil-omega-3 fatty acids 1000 MG capsule  Take 2 g by mouth daily.     FLAX SEED OIL PO  Take by mouth.     INTEGRA PLUS Caps  Take 1 capsule by mouth daily.     LORazepam 0.5 MG tablet  Commonly known as:  ATIVAN  Take 1-2 tablets (0.5-1 mg total) by mouth at bedtime as needed for anxiety.     metoprolol 100 MG tablet  Commonly known as:  LOPRESSOR  Take 1 tablet (100 mg total) by mouth 2 (two) times daily.  omeprazole 40 MG capsule  Commonly known as:  PRILOSEC  Take 1 capsule (40 mg total) by mouth daily.     simvastatin 80 MG tablet  Commonly known as:  ZOCOR  Take 1 tablet (80 mg total) by mouth at bedtime.     TYLENOL 8 HOUR PO  Take 1 tablet by mouth as needed.           Objective:   Physical Exam BP 126/76 mmHg  Pulse 58  Temp(Src) 98.2 F (36.8 C) (Oral)  Ht 5\' 8"  (1.727 m)  Wt 200 lb 8 oz (90.946 kg)  BMI 30.49 kg/m2  SpO2 96% General:   Well developed, well nourished . NAD.  HEENT:  Normocephalic . Face symmetric, atraumatic Lungs:  CTA B Normal respiratory effort, no intercostal retractions, no accessory muscle use. Heart: RRR,  no murmur.  No pretibial edema bilaterally  Skin: Not pale. Not jaundice Neurologic:  alert & oriented X3.  Speech normal, gait appropriate for age and unassisted Psych--  Cognition and judgment appear intact.  Cooperative with normal attention span and concentration.  Behavior appropriate. No anxious or depressed appearing. \    Assessment & Plan:   Assessment > DM with  neuropathy HTN Hyperlipidemia Anxiety, depression, insomnia Chronic fatigue Chronic renal insufficiency  CV:  --CAD -- carotid artery disease, US done 10-2014 Hypogonadism DJD  Thrombocytopenia History of a neck mass, evaluated by ENT remotely   Plan: HTN: Since the last visit, BP was elevated, enalapril 5 BID was increased to 10 twice a day with good results. Check a BMP. Insomnia: He try temazepam but  was very expensive and now he is taking Ativan 2 tablets at night with good results. Refill as needed, check a UDS  CAD:now asymptomatic, echocardiogram 12-2014 with a slightly decreased EF, BNP normal. Saw cardiology recently and felt to be stable   Carotid disease:  Patient requested to check his ultrasound every 6 months, cardiology already ordered for the next ultrasound  Iron deficiency anemia: Saw GI, they recommended a colonoscopy and EGD at some point, pt likes to proceed. Will send a message to GI. RTC 03-2015 as scheduled

## 2015-01-04 NOTE — Progress Notes (Signed)
Pre visit review using our clinic review tool, if applicable. No additional management support is needed unless otherwise documented below in the visit note. 

## 2015-01-04 NOTE — Patient Instructions (Addendum)
Get your blood work before you leave  We also need a urine sample for a UDS    Next visit   already scheduled for January

## 2015-01-05 NOTE — Assessment & Plan Note (Signed)
HTN: Since the last visit, BP was elevated, enalapril 5 BID was increased to 10 twice a day with good results. Check a BMP. Insomnia: He try temazepam but  was very expensive and now he is taking Ativan 2 tablets at night with good results. Refill as needed, check a UDS  CAD:now asymptomatic, echocardiogram 12-2014 with a slightly decreased EF, BNP normal. Saw cardiology recently and felt to be stable   Carotid disease:  Patient requested to check his ultrasound every 6 months, cardiology already ordered for the next ultrasound  Iron deficiency anemia: Saw GI, they recommended a colonoscopy and EGD at some point, pt likes to proceed. Will send a message to GI. RTC 03-2015 as scheduled

## 2015-01-06 ENCOUNTER — Telehealth: Payer: Self-pay | Admitting: *Deleted

## 2015-01-06 NOTE — Telephone Encounter (Signed)
-----   Message from Colon Branch, MD sent at 01/05/2015  9:19 PM EDT ----- Regarding: RE: Not enough urine Call pt and bring him back, thx! JP  ----- Message -----    From: Harl Bowie, CMA    Sent: 01/05/2015   2:08 PM      To: Colon Branch, MD Subject: Not enough urine                               Pt gave a urine for UDS, but unable to run the UDS because it was not enough urine.   Do you want to have the pt come back to give a urine, or wait until next visit.  Thanks  Angie

## 2015-01-06 NOTE — Telephone Encounter (Signed)
Called and [facility] @ 1:51pm @ [phone]) asking the pt to RTC regarding repeat UDS sample.//AB/CMA

## 2015-01-09 ENCOUNTER — Inpatient Hospital Stay (HOSPITAL_COMMUNITY): Admission: RE | Admit: 2015-01-09 | Payer: Self-pay | Source: Ambulatory Visit

## 2015-01-09 NOTE — Telephone Encounter (Signed)
Called and [facility] @ 2:55 @ [phone]) asking the pt to RTC regarding urine for UDS.//AB/CMA

## 2015-01-10 ENCOUNTER — Ambulatory Visit (INDEPENDENT_AMBULATORY_CARE_PROVIDER_SITE_OTHER): Payer: Commercial Managed Care - HMO | Admitting: Internal Medicine

## 2015-01-10 VITALS — BP 172/65 | HR 56

## 2015-01-10 DIAGNOSIS — I10 Essential (primary) hypertension: Secondary | ICD-10-CM | POA: Diagnosis not present

## 2015-01-10 MED ORDER — FUROSEMIDE 20 MG PO TABS: 20.0000 mg | ORAL_TABLET | Freq: Every day | ORAL | Status: DC

## 2015-01-10 NOTE — Progress Notes (Signed)
Pre visit review using our clinic review tool, if applicable. No additional management support is needed unless otherwise documented below in the visit note.  Patient in for BP check. Using BP machine from home states readings have been high.

## 2015-01-10 NOTE — Progress Notes (Signed)
Subjective:    Patient ID: [name] Little, male    DOB: April 10, 1940, 74 y.o.   MRN: [id number]  DOS:  01/10/2015 Type of visit - description :  Interval history:    Review of Systems   Past Medical History  Diagnosis Date  . Anxiety and depression   . Hyperlipidemia   . Hypertension   . Diabetes mellitus   . GERD (gastroesophageal reflux disease)   . PUD (peptic ulcer disease) 7/11    EGD showed few small ulcers, esophagues strecthed  . Headache(784.0) 06/2003    s/p neuro eval ? migraine  . CAD (coronary artery disease)   . Hyperplastic colon polyp 2008  . Iron deficiency     h/o  . Arthritis   . Ischemic cardiomyopathy     EF had 30-35%. The most recent EF was 50% on echo in september 2009  . Allergic rhinitis     Past Surgical History  Procedure Laterality Date  . Coronary artery bypass graft  2004    LIMA to the LADs, sequentail SVG to intermediated and obtuse marginal, sequential SVG to PDA and posterolaterla  . Inguinal hernia repair Bilateral   . Cataract extraction Right 07-2012  . Tonsillectomy      Social History   Social History  . Marital Status: Married    Spouse Name: N/A  . Number of Children: 0  . Years of Education: N/A   Occupational History  . retired Freight forwarder     Social History Main Topics  . Smoking status: Former Smoker    Types: Cigarettes    Quit date: 12/16/1976  . Smokeless tobacco: Never Used  . Alcohol Use: No  . Drug Use: No  . Sexual Activity: Not on file   Other Topics Concern  . Not on file   Social History Narrative   Married , lives w/ wife , no children              Medication List       This list is accurate as of: 01/10/15  6:05 PM.  Always use your most recent med list.               aspirin 81 MG chewable tablet  2 daily.     co-enzyme Q-10 50 MG capsule  Take 400 mg by mouth daily.     cyanocobalamin 100 MCG tablet  Take 1,000 mcg by mouth daily.     enalapril 10 MG tablet  Commonly  known as:  VASOTEC  Take 1 tablet (10 mg total) by mouth 2 (two) times daily.     fish oil-omega-3 fatty acids 1000 MG capsule  Take 2 g by mouth daily.     FLAX SEED OIL PO  Take by mouth.     furosemide 20 MG tablet  Commonly known as:  LASIX  Take 1 tablet (20 mg total) by mouth daily.     INTEGRA PLUS Caps  Take 1 capsule by mouth daily.     LORazepam 0.5 MG tablet  Commonly known as:  ATIVAN  Take 1-2 tablets (0.5-1 mg total) by mouth at bedtime as needed for anxiety.     metoprolol 100 MG tablet  Commonly known as:  LOPRESSOR  Take 1 tablet (100 mg total) by mouth 2 (two) times daily.     omeprazole 40 MG capsule  Commonly known as:  PRILOSEC  Take 1 capsule (40 mg total) by mouth daily.     simvastatin  80 MG tablet  Commonly known as:  ZOCOR  Take 1 tablet (80 mg total) by mouth at bedtime.     TYLENOL 8 HOUR PO  Take 1 tablet by mouth as needed.           Objective:   Physical Exam BP 172/65 mmHg  Pulse 56     Assessment & Plan:   BP elevated, start Lasix

## 2015-01-10 NOTE — Telephone Encounter (Signed)
Spoke with the pt and informed him that the urine he gave for the UDS was not enough, and we will need for him to give [place] another urine.  Pt verbalized understanding and agreed.  Pt stated that he will come back to repeat the UDS.//AB/CMA

## 2015-01-12 NOTE — Patient Instructions (Addendum)
Lasix 20mg  1 tablet daily#30 with 1 refill. Sent to pharmacy.Marland Kitchen BMP scheduled for 10 days. Reck BP at home and bring in readings when you return for labwork.  Patient notified on 01/10/15.

## 2015-01-16 ENCOUNTER — Telehealth: Payer: Self-pay

## 2015-01-16 NOTE — Telephone Encounter (Signed)
UDS: 01/10/2015  Positive for Lorazepam   Low risk per Dr. Larose Kells 01/16/2015

## 2015-01-23 ENCOUNTER — Other Ambulatory Visit (INDEPENDENT_AMBULATORY_CARE_PROVIDER_SITE_OTHER): Payer: Commercial Managed Care - HMO

## 2015-01-23 ENCOUNTER — Telehealth: Payer: Self-pay | Admitting: Internal Medicine

## 2015-01-23 ENCOUNTER — Encounter: Payer: Self-pay | Admitting: Internal Medicine

## 2015-01-23 ENCOUNTER — Other Ambulatory Visit: Payer: Self-pay | Admitting: Internal Medicine

## 2015-01-23 DIAGNOSIS — I10 Essential (primary) hypertension: Secondary | ICD-10-CM | POA: Diagnosis not present

## 2015-01-23 LAB — BASIC METABOLIC PANEL
BUN: 41 mg/dL — AB (ref 6–23)
CALCIUM: 9.8 mg/dL (ref 8.4–10.5)
CO2: 30 mEq/L (ref 19–32)
CREATININE: 1.7 mg/dL — AB (ref 0.40–1.50)
Chloride: 98 mEq/L (ref 96–112)
GFR: 41.95 mL/min — AB (ref >60.00)
Glucose, Bld: 130 mg/dL — ABNORMAL HIGH (ref 70–99)
Potassium: 5.5 mEq/L — ABNORMAL HIGH (ref 3.5–5.1)
Sodium: 135 mEq/L (ref 135–145)

## 2015-01-23 NOTE — Addendum Note (Signed)
Addended by: Wilfrid Lund on: 01/23/2015 04:47 PM   Modules accepted: Orders

## 2015-01-23 NOTE — Telephone Encounter (Signed)
Pt brought paper in envelope with blood pressure numbers and about his weight loss.

## 2015-01-23 NOTE — Telephone Encounter (Signed)
Forwarded to Dr. Paz. JG//CMA 

## 2015-01-23 NOTE — Telephone Encounter (Signed)
Noted, will look for paper.

## 2015-01-24 ENCOUNTER — Telehealth: Payer: Self-pay | Admitting: Internal Medicine

## 2015-01-24 NOTE — Telephone Encounter (Signed)
Patient sent me low or his BPs: After he started Lasix 01/10/2015 BPs were great at 120/70 on average. Prior his BPs were in the 140s, 150s. After Lasix, weight decreased from the 193s to 187. Currently off Lasix due to increased creatinine.

## 2015-01-25 ENCOUNTER — Telehealth: Payer: Self-pay

## 2015-01-25 NOTE — Telephone Encounter (Signed)
Patient states he has been off Lasix x 2 days now and his BP is 177/95 10 minutes ago.states this am BP was 130/54. Says he has a new BP machine that he is using. Patient denies pain,headaches,dizziness,and SOB. States his face is warm possibly d/ allergies. Please advise.

## 2015-01-25 NOTE — Telephone Encounter (Signed)
BP is labile, recommend starting low-dose of amlodipine 2.5 mg 1 by mouth daily #30 and 6 refills. may take a few days to start working. Watch for side effects such as leg swelling. Call w/ readings in 10 days

## 2015-01-30 ENCOUNTER — Other Ambulatory Visit (INDEPENDENT_AMBULATORY_CARE_PROVIDER_SITE_OTHER): Payer: Commercial Managed Care - HMO

## 2015-01-30 ENCOUNTER — Telehealth: Payer: Self-pay

## 2015-01-30 ENCOUNTER — Telehealth: Payer: Self-pay | Admitting: Internal Medicine

## 2015-01-30 ENCOUNTER — Other Ambulatory Visit: Payer: Self-pay

## 2015-01-30 ENCOUNTER — Other Ambulatory Visit: Payer: Self-pay | Admitting: Internal Medicine

## 2015-01-30 DIAGNOSIS — I10 Essential (primary) hypertension: Secondary | ICD-10-CM | POA: Diagnosis not present

## 2015-01-30 LAB — BASIC METABOLIC PANEL
BUN: 19 mg/dL (ref 6–23)
CO2: 29 mEq/L (ref 19–32)
Calcium: 10 mg/dL (ref 8.4–10.5)
Chloride: 102 mEq/L (ref 96–112)
Creatinine, Ser: 1.3 mg/dL (ref 0.40–1.50)
GFR: 57.17 mL/min — ABNORMAL LOW (ref >60.00)
Glucose, Bld: 105 mg/dL — ABNORMAL HIGH (ref 70–99)
Potassium: 5.4 mEq/L — ABNORMAL HIGH (ref 3.5–5.1)
SODIUM: 140 meq/L (ref 135–145)

## 2015-01-30 MED ORDER — AMLODIPINE BESYLATE 2.5 MG PO TABS: 2.5000 mg | ORAL_TABLET | Freq: Every day | ORAL | Status: DC

## 2015-01-30 NOTE — Telephone Encounter (Signed)
Pt dropped off paper with Bloodpressure and lost of weight written down.

## 2015-01-30 NOTE — Telephone Encounter (Signed)
Spoke with patients wife regarding new orders. Will have patient call back in 10 days with BP readings.

## 2015-01-30 NOTE — Telephone Encounter (Signed)
Called patient regarding new medication for BP. Requested return call. Advised new BP medication sent to pharmacy.

## 2015-01-30 NOTE — Telephone Encounter (Signed)
Called patient with regarding new medication and BP . Left message for return call and advised new medication has been sent to pharmacy.

## 2015-01-31 NOTE — Telephone Encounter (Signed)
Patient   BP readings: 01/29/2015: BP 127/80, 122/73 01/30/2015: 131/72. BMP from yesterday showed a creatinine is improving, potassium is still elevated. Advise patient: 1. BP improving, c the nw medication ( amlodipine) is working. No change 2. Potassium is still elevated but the kidney function is improving : Send the patient a low potassium diet. 3. Arrange office visit 3 weeks from today.

## 2015-01-31 NOTE — Telephone Encounter (Signed)
Notified pt and he voices understanding. Appt scheduled for 02/22/15 at 1pm.

## 2015-01-31 NOTE — Telephone Encounter (Signed)
Forwarded to Dr. Paz 

## 2015-02-09 ENCOUNTER — Ambulatory Visit (INDEPENDENT_AMBULATORY_CARE_PROVIDER_SITE_OTHER): Payer: Commercial Managed Care - HMO | Admitting: Family Medicine

## 2015-02-09 ENCOUNTER — Telehealth: Payer: Self-pay

## 2015-02-09 ENCOUNTER — Encounter: Payer: Self-pay | Admitting: Family Medicine

## 2015-02-09 VITALS — BP 172/79 | HR 55 | Temp 97.5°F | Ht 68.0 in | Wt 191.8 lb

## 2015-02-09 DIAGNOSIS — I10 Essential (primary) hypertension: Secondary | ICD-10-CM

## 2015-02-09 NOTE — Telephone Encounter (Signed)
Pt brought in recent BP readings. Forwarded to Dr. Larose Kells. Also Pt wants to make sure his A1c is checked at his next visit on 02/22/2015. Also states he has lost 25 lbs and is wanting to know if that is to much to fast, but that he would like to lose at least another 5 to 7 more lbs.

## 2015-02-09 NOTE — Assessment & Plan Note (Signed)
Chronic problem for pt.  BP is not well controlled in office but his home readings are excellent.  Based on his home readings, I do not want to adjust pt's medications to avoid hypotension.  Pt is in agreement w/ this and has f/u arranged w/ PCP.  Stressed need for low Na diet.

## 2015-02-09 NOTE — Progress Notes (Signed)
Pre visit review using our clinic review tool, if applicable. No additional management support is needed unless otherwise documented below in the visit note. 

## 2015-02-09 NOTE — Progress Notes (Signed)
   Subjective:    Patient ID: [name] Little, male    DOB: [birth year], 74 y.o.   MRN: [id number]  HPI HTN- chronic problem.  Recent readings have been good at home.  Pt reports allergy 'attack' this AM and feels that this causes his elevated pressure.  Pt reports BP problems 'since May'.  Pt has been recording home BPs since 11/25, ranging 107-137/70-78.  Denies CP, SOB   Review of Systems For ROS see HPI     Objective:   Physical Exam  Constitutional: He is oriented to person, place, and time. He appears well-developed and well-nourished. No distress.  HENT:  Head: Normocephalic and atraumatic.  Eyes: Conjunctivae and EOM are normal. Pupils are equal, round, and reactive to light.  Neck: Normal range of motion. Neck supple. No thyromegaly present.  Cardiovascular: Normal rate, regular rhythm, normal heart sounds and intact distal pulses.   No murmur heard. Pulmonary/Chest: Effort normal and breath sounds normal. No respiratory distress.  Abdominal: Soft. Bowel sounds are normal. He exhibits no distension.  Musculoskeletal: He exhibits no edema.  Lymphadenopathy:    He has no cervical adenopathy.  Neurological: He is alert and oriented to person, place, and time. No cranial nerve deficit.  Skin: Skin is warm and dry.  Psychiatric: He has a normal mood and affect. His behavior is normal.  Vitals reviewed.         Assessment & Plan:

## 2015-02-09 NOTE — Patient Instructions (Signed)
Follow up with Dr Larose Kells in 2-3 months to recheck blood pressure No medication changes at this time Continue to check your BP 2-3x/week If you have an allergy attack, it is ok to take Benadryl Take daily Claritin or Zyrtec to try and prevent an allergy attack Call with any questions or concerns Happy Holidays!

## 2015-02-13 NOTE — Telephone Encounter (Signed)
MyChart message sent. Instructed Pt to let me know if he has any questions.

## 2015-02-13 NOTE — Telephone Encounter (Signed)
Home BPs are  excellent, mostly 120, 130. Has lost some weight but per  our scales not dramatically so. Advise patient: Everything looks okay, continue his efforts to lose weight.

## 2015-02-21 ENCOUNTER — Encounter: Payer: Self-pay | Admitting: Gastroenterology

## 2015-02-22 ENCOUNTER — Ambulatory Visit (INDEPENDENT_AMBULATORY_CARE_PROVIDER_SITE_OTHER): Payer: Commercial Managed Care - HMO | Admitting: Internal Medicine

## 2015-02-22 ENCOUNTER — Encounter: Payer: Self-pay | Admitting: Internal Medicine

## 2015-02-22 VITALS — BP 148/76 | HR 59 | Temp 97.7°F | Ht 68.0 in | Wt 188.1 lb

## 2015-02-22 DIAGNOSIS — Z09 Encounter for follow-up examination after completed treatment for conditions other than malignant neoplasm: Secondary | ICD-10-CM

## 2015-02-22 DIAGNOSIS — E1140 Type 2 diabetes mellitus with diabetic neuropathy, unspecified: Secondary | ICD-10-CM | POA: Diagnosis not present

## 2015-02-22 DIAGNOSIS — I10 Essential (primary) hypertension: Secondary | ICD-10-CM

## 2015-02-22 LAB — BASIC METABOLIC PANEL
BUN: 21 mg/dL (ref 6–23)
CHLORIDE: 102 meq/L (ref 96–112)
CO2: 29 mEq/L (ref 19–32)
CREATININE: 1.28 mg/dL (ref 0.40–1.50)
Calcium: 9 mg/dL (ref 8.4–10.5)
GFR: 58.19 mL/min — ABNORMAL LOW (ref >60.00)
Glucose, Bld: 107 mg/dL — ABNORMAL HIGH (ref 70–99)
Potassium: 4.7 mEq/L (ref 3.5–5.1)
Sodium: 138 mEq/L (ref 135–145)

## 2015-02-22 LAB — HEMOGLOBIN A1C: HEMOGLOBIN A1C: 6.2 % (ref 4.6–6.5)

## 2015-02-22 NOTE — Progress Notes (Signed)
Pre visit review using our clinic review tool, if applicable. No additional management support is needed unless otherwise documented below in the visit note. 

## 2015-02-22 NOTE — Assessment & Plan Note (Signed)
DM: Check A1c HTN: Since the last time I saw him,  Lasix increased creatinine, it is trending down but will check another BMP today. Continue with amlodipine, Vasotec, Lopressor. RTC 4 months

## 2015-02-22 NOTE — Progress Notes (Signed)
Subjective:    Patient ID: [name] Little, male    DOB: 1940/05/23, 74 y.o.   MRN: [id number]  DOS:  02/22/2015 Type of visit - description : Follow-up Interval history:  On 01/10/2015 started Lasix for elevated BP, shortly after, creatinine was noted to be elevated and Lasix discontinued. Last BMP showed creatinine is trending down, potassium was slightly elevated, not following a low potassium diet  Ambulatory BPs 119, 134, 120. Diastolic BP is in the [id number].  Review of Systems Denies chest pain or difficulty breathing. No lower extremity edema No fever or chills    Past Medical History  Diagnosis Date  . Anxiety and depression   . Hyperlipidemia   . Hypertension   . Diabetes mellitus   . GERD (gastroesophageal reflux disease)   . PUD (peptic ulcer disease) 7/11    EGD showed few small ulcers, esophagues strecthed  . Headache(784.0) 06/2003    s/p neuro eval ? migraine  . CAD (coronary artery disease)   . Hyperplastic colon polyp 2008  . Iron deficiency     h/o  . Arthritis   . Ischemic cardiomyopathy     EF had 30-35%. The most recent EF was 50% on echo in september 2009  . Allergic rhinitis     Past Surgical History  Procedure Laterality Date  . Coronary artery bypass graft  2004    LIMA to the LADs, sequentail SVG to intermediated and obtuse marginal, sequential SVG to PDA and posterolaterla  . Inguinal hernia repair Bilateral   . Cataract extraction Right 07-2012  . Tonsillectomy      Social History   Social History  . Marital Status: Married    Spouse Name: N/A  . Number of Children: 0  . Years of Education: N/A   Occupational History  . retired Freight forwarder     Social History Main Topics  . Smoking status: Former Smoker    Types: Cigarettes    Quit date: 12/16/1976  . Smokeless tobacco: Never Used  . Alcohol Use: No  . Drug Use: No  . Sexual Activity: Not on file   Other Topics Concern  . Not on file   Social History Narrative   Married ,  lives w/ wife , no children              Medication List       This list is accurate as of: 02/22/15  5:15 PM.  Always use your most recent med list.               amLODipine 2.5 MG tablet  Commonly known as:  NORVASC  Take 1 tablet (2.5 mg total) by mouth daily.     aspirin 81 MG chewable tablet  2 daily.     co-enzyme Q-10 50 MG capsule  Take 400 mg by mouth daily.     enalapril 10 MG tablet  Commonly known as:  VASOTEC  Take 1 tablet (10 mg total) by mouth 2 (two) times daily.     fish oil-omega-3 fatty acids 1000 MG capsule  Take 2 g by mouth daily.     FLAX SEED OIL PO  Take by mouth.     metoprolol 100 MG tablet  Commonly known as:  LOPRESSOR  Take 1 tablet (100 mg total) by mouth 2 (two) times daily.     omeprazole 40 MG capsule  Commonly known as:  PRILOSEC  Take 1 capsule (40 mg total) by mouth daily.  simvastatin 80 MG tablet  Commonly known as:  ZOCOR  Take 1 tablet (80 mg total) by mouth at bedtime.     TYLENOL 8 HOUR PO  Take 1 tablet by mouth as needed.           Objective:   Physical Exam BP 148/76 mmHg  Pulse 59  Temp(Src) 97.7 F (36.5 C) (Oral)  Ht 5\' 8"  (1.727 m)  Wt 188 lb 2 oz (85.333 kg)  BMI 28.61 kg/m2  SpO2 98% General:   Well developed, well nourished . NAD.  HEENT:  Normocephalic . Face symmetric, atraumatic Lungs:  CTA B Normal respiratory effort, no intercostal retractions, no accessory muscle use. Heart: RRR,  no murmur.  No pretibial edema bilaterally  Skin: Not pale. Not jaundice Neurologic:  alert & oriented X3.  Speech normal, gait appropriate for age and unassisted Psych--  Cognition and judgment appear intact.  Cooperative with normal attention span and concentration.  Behavior appropriate. No anxious or depressed appearing.      Assessment & Plan:   Assessment > DM with neuropathy HTN: rx lasix 01-2015, creat increased, d/c lasix Hyperlipidemia Anxiety, depression, insomnia Chronic  fatigue Chronic renal insufficiency   CV:  --CAD -- carotid artery disease, US done 10-2014 Hypogonadism DJD   Thrombocytopenia History of a neck mass, evaluated by ENT remotely  PLAN: DM: Check A1c HTN: Since the last time I saw him,  Lasix increased creatinine, it is trending down but will check another BMP today. Continue with amlodipine, Vasotec, Lopressor. RTC 4 months

## 2015-02-22 NOTE — Patient Instructions (Signed)
Before you leave the office:  [facility]  and get the blood work    [place]   Schedule a routine office visit or check up to be done in  4 months  No  fasting    After you leave the office:  Continue check the  blood pressure 4 to 5  times a week Be sure your blood pressure is between 110/65 and  145/85. If it is consistently higher or lower, let me know

## 2015-03-07 ENCOUNTER — Ambulatory Visit: Payer: Self-pay | Admitting: Internal Medicine

## 2015-05-08 ENCOUNTER — Other Ambulatory Visit: Payer: Self-pay | Admitting: Internal Medicine

## 2015-05-08 ENCOUNTER — Encounter: Payer: Self-pay | Admitting: Internal Medicine

## 2015-05-08 DIAGNOSIS — E1140 Type 2 diabetes mellitus with diabetic neuropathy, unspecified: Secondary | ICD-10-CM

## 2015-05-11 ENCOUNTER — Encounter: Payer: Self-pay | Admitting: Internal Medicine

## 2015-05-25 NOTE — Telephone Encounter (Signed)
Received letter from [facility] Medicare for a denial of medical coverage for a referral placed to Marica Otter, DO due to provider not being in network. They have provided several in network providers located in the area:  Dr. Leticia Clas [phone] Dr. Bettina Gavia [phone] Dr. Merideth Abbey [phone]  MyChart message sent to Pt to inform him of above and recommended he call his insurance company if he has any further questions. Letter sent for scanning.

## 2015-05-30 LAB — HM DIABETES EYE EXAM

## 2015-06-05 ENCOUNTER — Encounter: Payer: Self-pay | Admitting: Gastroenterology

## 2015-06-07 ENCOUNTER — Encounter: Payer: Self-pay | Admitting: Internal Medicine

## 2015-06-08 NOTE — Telephone Encounter (Signed)
Chart updated

## 2015-06-21 ENCOUNTER — Encounter: Payer: Self-pay | Admitting: Internal Medicine

## 2015-06-21 ENCOUNTER — Ambulatory Visit (INDEPENDENT_AMBULATORY_CARE_PROVIDER_SITE_OTHER): Payer: Commercial Managed Care - HMO | Admitting: Internal Medicine

## 2015-06-21 VITALS — BP 108/62 | HR 53 | Temp 98.3°F | Ht 68.0 in | Wt 195.5 lb

## 2015-06-21 DIAGNOSIS — Z09 Encounter for follow-up examination after completed treatment for conditions other than malignant neoplasm: Secondary | ICD-10-CM

## 2015-06-21 DIAGNOSIS — E1140 Type 2 diabetes mellitus with diabetic neuropathy, unspecified: Secondary | ICD-10-CM

## 2015-06-21 DIAGNOSIS — I10 Essential (primary) hypertension: Secondary | ICD-10-CM | POA: Diagnosis not present

## 2015-06-21 LAB — HEMOGLOBIN A1C: HEMOGLOBIN A1C: 6.4 % (ref 4.6–6.5)

## 2015-06-21 NOTE — Progress Notes (Signed)
Pre visit review using our clinic review tool, if applicable. No additional management support is needed unless otherwise documented below in the visit note. 

## 2015-06-21 NOTE — Progress Notes (Signed)
Subjective:    Patient ID: [name] Little, male    DOB: 04/26/1940, 75 y.o.   MRN: [id number]  DOS:  06/21/2015 Type of visit - description : rov Interval history: In general feeling well, no major concerns. Medications reviewed, good compliance Labs reviewed, due for A1c BP slightly low, he feels well today, ambulatory BPs usually 120, 130/70   Review of Systems Denies chest pain or difficulty breathing Occasionally has lower extremity numbness at night, sporadic.  Past Medical History  Diagnosis Date  . Anxiety and depression   . Hyperlipidemia   . Hypertension   . Diabetes mellitus   . GERD (gastroesophageal reflux disease)   . PUD (peptic ulcer disease) 7/11    EGD showed few small ulcers, esophagues strecthed  . Headache(784.0) 06/2003    s/p neuro eval ? migraine  . CAD (coronary artery disease)   . Hyperplastic colon polyp 2008  . Iron deficiency     h/o  . Arthritis   . Ischemic cardiomyopathy     EF had 30-35%. The most recent EF was 50% on echo in september 2009  . Allergic rhinitis     Past Surgical History  Procedure Laterality Date  . Coronary artery bypass graft  2004    LIMA to the LADs, sequentail SVG to intermediated and obtuse marginal, sequential SVG to PDA and posterolaterla  . Inguinal hernia repair Bilateral   . Cataract extraction Right 07-2012  . Tonsillectomy      Social History   Social History  . Marital Status: Married    Spouse Name: N/A  . Number of Children: 0  . Years of Education: N/A   Occupational History  . retired Freight forwarder     Social History Main Topics  . Smoking status: Former Smoker    Types: Cigarettes    Quit date: 12/16/1976  . Smokeless tobacco: Never Used  . Alcohol Use: No  . Drug Use: No  . Sexual Activity: Not on file   Other Topics Concern  . Not on file   Social History Narrative   Married , lives w/ wife , no children              Medication List       This list is accurate as of:  06/21/15  6:21 PM.  Always use your most recent med list.               amLODipine 2.5 MG tablet  Commonly known as:  NORVASC  Take 1 tablet (2.5 mg total) by mouth daily.     aspirin 81 MG chewable tablet  2 daily.     co-enzyme Q-10 50 MG capsule  Take 400 mg by mouth daily.     enalapril 10 MG tablet  Commonly known as:  VASOTEC  Take 1 tablet (10 mg total) by mouth 2 (two) times daily.     fish oil-omega-3 fatty acids 1000 MG capsule  Take 2 g by mouth daily.     FLAX SEED OIL PO  Take by mouth.     metoprolol 100 MG tablet  Commonly known as:  LOPRESSOR  Take 1 tablet (100 mg total) by mouth 2 (two) times daily.     omeprazole 40 MG capsule  Commonly known as:  PRILOSEC  Take 1 capsule (40 mg total) by mouth daily.     simvastatin 80 MG tablet  Commonly known as:  ZOCOR  Take 1 tablet (80 mg total) by mouth  at bedtime.     TYLENOL 8 HOUR PO  Take 1 tablet by mouth as needed.           Objective:   Physical Exam BP 108/62 mmHg  Pulse 53  Temp(Src) 98.3 F (36.8 C) (Oral)  Ht 5\' 8"  (1.727 m)  Wt 195 lb 8 oz (88.678 kg)  BMI 29.73 kg/m2  SpO2 96% General:   Well developed, well nourished . NAD.  HEENT:  Normocephalic . Face symmetric, atraumatic Lungs:  CTA B Normal respiratory effort, no intercostal retractions, no accessory muscle use. Heart: RRR,  no murmur.  No pretibial edema bilaterally  DIABETIC FEET EXAM: No lower extremity edema Normal pedal pulses bilaterally Skin normal, nails normal, no calluses Pinprick examination of the feet normal. Neurologic:  alert & oriented X3.  Speech normal, gait appropriate for age and unassisted Psych--  Cognition and judgment appear intact.  Cooperative with normal attention span and concentration.  Behavior appropriate. No anxious or depressed appearing.      Assessment & Plan:   Assessment > DM with neuropathy (Occasional numbness, normal pinprick exam 06-2015) HTN: rx lasix 01-2015, creat  increased, d/c lasix Hyperlipidemia Anxiety, depression, insomnia Chronic fatigue Chronic renal insufficiency   CV:  --CAD -- carotid artery disease, US done 10-2014 Hypogonadism DJD   Thrombocytopenia History of a neck mass, evaluated by ENT remotely  PLAN: DM with neuropathy: Check A1c, feet care discussed. He seems to be doing well HTN: BP is slightly low today, he is asymptomatic, ambulatory BPs within normal. Continue amlodipine, Vasotec and Lopressor Chronic renal insufficiency: Last BMP satisfactory RTC 10-2015, CPX

## 2015-06-21 NOTE — Patient Instructions (Addendum)
GO TO THE LAB :      Get the blood work     GO TO THE FRONT DESK  Schedule your next appointment for a physical exam, by 10-2015, fasting   Diabetes and Foot Care Diabetes may cause you to have problems because of poor blood supply (circulation) to your feet and legs. This may cause the skin on your feet to become thinner, break easier, and heal more slowly. Your skin may become dry, and the skin may peel and crack. You may also have nerve damage in your legs and feet causing decreased feeling in them. You may not notice minor injuries to your feet that could lead to infections or more serious problems. Taking care of your feet is one of the most important things you can do for yourself.  HOME CARE INSTRUCTIONS  Wear shoes at all times, even in the house. Do not go barefoot. Bare feet are easily injured.  Check your feet daily for blisters, cuts, and redness. If you cannot see the bottom of your feet, use a mirror or ask someone for help.  Wash your feet with warm water (do not use hot water) and mild soap. Then pat your feet and the areas between your toes until they are completely dry. Do not soak your feet as this can dry your skin.  Apply a moisturizing lotion or petroleum jelly (that does not contain alcohol and is unscented) to the skin on your feet and to dry, brittle toenails. Do not apply lotion between your toes.  Trim your toenails straight across. Do not dig under them or around the cuticle. File the edges of your nails with an emery board or nail file.  Do not cut corns or calluses or try to remove them with medicine.  Wear clean socks or stockings every day. Make sure they are not too tight. Do not wear knee-high stockings since they may decrease blood flow to your legs.  Wear shoes that fit properly and have enough cushioning. To break in new shoes, wear them for just a few hours a day. This prevents you from injuring your feet. Always look in your shoes before you put them on  to be sure there are no objects inside.  Do not cross your legs. This may decrease the blood flow to your feet.  If you find a minor scrape, cut, or break in the skin on your feet, keep it and the skin around it clean and dry. These areas may be cleansed with mild soap and water. Do not cleanse the area with peroxide, alcohol, or iodine.  When you remove an adhesive bandage, be sure not to damage the skin around it.  If you have a wound, look at it several times a day to make sure it is healing.  Do not use heating pads or hot water bottles. They may burn your skin. If you have lost feeling in your feet or legs, you may not know it is happening until it is too late.  Make sure your health care provider performs a complete foot exam at least annually or more often if you have foot problems. Report any cuts, sores, or bruises to your health care provider immediately. SEEK MEDICAL CARE IF:   You have an injury that is not healing.  You have cuts or breaks in the skin.  You have an ingrown nail.  You notice redness on your legs or feet.  You feel burning or tingling in your legs  or feet.  You have pain or cramps in your legs and feet.  Your legs or feet are numb.  Your feet always feel cold. SEEK IMMEDIATE MEDICAL CARE IF:   There is increasing redness, swelling, or pain in or around a wound.  There is a red line that goes up your leg.  Pus is coming from a wound.  You develop a fever or as directed by your health care provider.  You notice a bad smell coming from an ulcer or wound.   This information is not intended to replace advice given to you by your health care provider. Make sure you discuss any questions you have with your health care provider.   Document Released: 02/16/2000 Document Revised: 10/21/2012 Document Reviewed: 07/28/2012 Elsevier Interactive Patient Education [employer].

## 2015-06-21 NOTE — Assessment & Plan Note (Signed)
DM with neuropathy: Check A1c, feet care discussed. He seems to be doing well HTN: BP is slightly low today, he is asymptomatic, ambulatory BPs within normal. Continue amlodipine, Vasotec and Lopressor Chronic renal insufficiency: Last BMP satisfactory RTC 10-2015, CPX

## 2015-06-22 ENCOUNTER — Encounter: Payer: Self-pay | Admitting: Internal Medicine

## 2015-06-30 ENCOUNTER — Other Ambulatory Visit: Payer: Self-pay

## 2015-06-30 MED ORDER — METOPROLOL TARTRATE 100 MG PO TABS: 100.0000 mg | ORAL_TABLET | Freq: Two times a day (BID) | ORAL | Status: DC

## 2015-06-30 MED ORDER — AMLODIPINE BESYLATE 2.5 MG PO TABS: 2.5000 mg | ORAL_TABLET | Freq: Every day | ORAL | Status: DC

## 2015-06-30 MED ORDER — SIMVASTATIN 80 MG PO TABS: 80.0000 mg | ORAL_TABLET | Freq: Every day | ORAL | Status: DC

## 2015-07-19 ENCOUNTER — Other Ambulatory Visit: Payer: Self-pay | Admitting: Internal Medicine

## 2015-07-26 ENCOUNTER — Other Ambulatory Visit: Payer: Self-pay | Admitting: Internal Medicine

## 2015-07-28 ENCOUNTER — Other Ambulatory Visit: Payer: Self-pay | Admitting: Internal Medicine

## 2015-08-14 ENCOUNTER — Other Ambulatory Visit: Payer: Self-pay

## 2015-08-14 ENCOUNTER — Telehealth: Payer: Self-pay | Admitting: Internal Medicine

## 2015-08-14 MED ORDER — AMLODIPINE BESYLATE 2.5 MG PO TABS: 2.5000 mg | ORAL_TABLET | Freq: Every day | ORAL | Status: DC

## 2015-08-14 MED ORDER — METOPROLOL TARTRATE 100 MG PO TABS: 100.0000 mg | ORAL_TABLET | Freq: Two times a day (BID) | ORAL | Status: DC

## 2015-08-14 MED ORDER — OMEPRAZOLE 40 MG PO CPDR: 40.0000 mg | DELAYED_RELEASE_CAPSULE | Freq: Every day | ORAL | Status: DC

## 2015-08-14 NOTE — Telephone Encounter (Signed)
Received, requested Rx's sent to [facility].

## 2015-08-14 NOTE — Telephone Encounter (Signed)
Follow-up      The pt is calling to make [place] aware of a form that needs to be filled out by Md and sent back to [employer].

## 2015-08-14 NOTE — Telephone Encounter (Signed)
Pt called in to make PCP aware that [facility] will be sending over a form informing him to send Rx via there mail order and also a 3 month supply.

## 2015-08-15 ENCOUNTER — Telehealth: Payer: Self-pay | Admitting: Cardiology

## 2015-08-15 ENCOUNTER — Other Ambulatory Visit: Payer: Self-pay

## 2015-08-15 MED ORDER — SIMVASTATIN 80 MG PO TABS: 80.0000 mg | ORAL_TABLET | Freq: Every day | ORAL | Status: DC

## 2015-08-16 ENCOUNTER — Other Ambulatory Visit: Payer: Self-pay

## 2015-08-16 MED ORDER — ENALAPRIL MALEATE 10 MG PO TABS: 10.0000 mg | ORAL_TABLET | Freq: Two times a day (BID) | ORAL | Status: DC

## 2015-09-05 ENCOUNTER — Encounter: Payer: Self-pay | Admitting: Internal Medicine

## 2015-09-11 ENCOUNTER — Encounter: Payer: Self-pay | Admitting: Family Medicine

## 2015-09-11 ENCOUNTER — Ambulatory Visit (INDEPENDENT_AMBULATORY_CARE_PROVIDER_SITE_OTHER): Payer: Commercial Managed Care - HMO | Admitting: Family Medicine

## 2015-09-11 VITALS — BP 142/70 | HR 63 | Temp 97.9°F | Ht 70.0 in | Wt 195.6 lb

## 2015-09-11 DIAGNOSIS — T148 Other injury of unspecified body region: Secondary | ICD-10-CM | POA: Diagnosis not present

## 2015-09-11 DIAGNOSIS — W57XXXA Bitten or stung by nonvenomous insect and other nonvenomous arthropods, initial encounter: Secondary | ICD-10-CM

## 2015-09-11 DIAGNOSIS — I499 Cardiac arrhythmia, unspecified: Secondary | ICD-10-CM

## 2015-09-11 MED ORDER — DOXYCYCLINE HYCLATE 100 MG PO CAPS: 100.0000 mg | ORAL_CAPSULE | Freq: Two times a day (BID) | ORAL | Status: DC

## 2015-09-11 NOTE — Progress Notes (Signed)
[place] [facility] [address] [place], [place] [address] [phone]  Date:  09/11/2015   Name:  [name] Little   DOB:  26-Dec-1940   MRN:  [id number]  PCP:  Kathlene November, MD    Chief Complaint: Tick Removal   History of Present Illness:  [name] Little is a 75 y.o. very pleasant male patient who presents with the following:  History of CAD, HTN, high cholesterol.  Here today with concern about a recent tick bite. He found a tick on his leg on 7/4. The tick was not swollen with blood. 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 He has not noted any fever, chills or aches.  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 Follow up--- PCP notes 11/02/2014  . Fatigue-- sleepy 04/20/2014  . Eye fatigue 12/07/2013  . Carotid arterial disease ([place]) 10/18/2013  . Sinusitis, chronic  (ill defined L face pressure) 02/05/2012  . Annual physical exam 08/21/2010  . RENAL INSUFFICIENCY, CHRONIC 04/26/2010  . ALLERGIC RHINITIS 04/23/2010  . NECK MASS 07/25/2009  . Diabetes mellitus with neuropathy ([place]) 11/15/2008  . THROMBOCYTOPENIA 06/06/2008  . HYPOGONADISM, MALE 02/04/2008  . CAD 06/01/2007  . DJD (degenerative joint disease) 06/01/2007  . Hyperlipidemia 06/24/2006  . Anxiety-depression- insomnia 06/24/2006  . Essential hypertension 06/24/2006  . GERD 06/24/2006  . HEADACHE 06/24/2006    Past Medical History  Diagnosis Date  . Anxiety and depression   . Hyperlipidemia   .  Hypertension   . Diabetes mellitus   . GERD (gastroesophageal reflux disease)   . PUD (peptic ulcer disease) 7/11    EGD showed few small ulcers, esophagues strecthed  . Headache(784.0) 06/2003    s/p neuro eval ? migraine  . CAD (coronary artery disease)   . Hyperplastic colon polyp 2008  . Iron deficiency     h/o  . Arthritis   . Ischemic cardiomyopathy     EF had 30-35%. The most recent EF was 50% on echo in september 2009  . Allergic rhinitis     Past Surgical History  Procedure Laterality Date  . Coronary artery bypass graft  2004    LIMA to the LADs, sequentail SVG to intermediated and obtuse marginal, sequential SVG to PDA and posterolaterla  . Inguinal hernia repair Bilateral   . Cataract extraction Right 07-2012  . Tonsillectomy      Social History  Substance Use Topics  . Smoking status: Former Smoker    Types: Cigarettes    Quit date: 12/16/1976  . Smokeless tobacco: Never Used  . Alcohol Use: No    Family History  Problem Relation Age of Onset  . Diabetes Father   . Colon cancer Neg Hx   . Prostate cancer Neg Hx   . Heart attack Mother   . Heart attack Father   . Cancer Paternal Grandfather     type unknown  Allergies  Allergen Reactions  . Micardis [Telmisartan]     REACTION: diarrhea    Medication list has been reviewed and updated.  Current Outpatient Prescriptions on File Prior to Visit  Medication Sig Dispense Refill  . Acetaminophen (TYLENOL 8 HOUR PO) Take 1 tablet by mouth as needed.    Marland Kitchen amLODipine (NORVASC) 2.5 MG tablet Take 1 tablet (2.5 mg total) by mouth daily. 90 tablet 1  . aspirin 81 MG chewable tablet 2 daily.     Marland Kitchen co-enzyme Q-10 50 MG capsule Take 400 mg by mouth daily.     . enalapril (VASOTEC) 10 MG tablet Take 1 tablet (10 mg total) by mouth 2 (two) times daily. 60 tablet 6  . fish oil-omega-3 fatty acids 1000 MG capsule Take 2 g by mouth daily.      . Flaxseed, Linseed, (FLAX SEED OIL PO) Take by mouth.     .  metoprolol (LOPRESSOR) 100 MG tablet Take 1 tablet (100 mg total) by mouth 2 (two) times daily. 180 tablet 1  . omeprazole (PRILOSEC) 40 MG capsule Take 1 capsule (40 mg total) by mouth daily. 90 capsule 1  . simvastatin (ZOCOR) 80 MG tablet Take 1 tablet (80 mg total) by mouth at bedtime. 90 tablet 2   No current facility-administered medications on file prior to visit.    Review of Systems:  As per HPI- otherwise negative. He thinks that the tick that bit him was a lone star tick which does range in this area    Physical Examination: Filed Vitals:   09/11/15 1557  BP: 142/70  Pulse: 63  Temp: 97.9 F (36.6 C)    GEN: WDWN, NAD, Non-toxic, A & O x 3, overweight, looks well HEENT: Atraumatic, Normocephalic. Neck supple. No masses, No LAD. Ears and Nose: No external deformity. CV: frequent irregular beats which may be PVC or similar but also raise concern for a fib, No M/G/R. No JVD. No thrill. No extra heart sounds. PULM: CTA B, no wheezes, crackles, rhonchi. No retractions. No resp. distress. No accessory muscle use. EXTR: No c/c/e NEURO Normal gait.  PSYCH: Normally interactive. Conversant. Not depressed or anxious appearing.  Calm demeanor.  Left upper thigh shows site of recent tick bite with some surrounding erythema.  No other rash, no target rash  EKG: NSR, no significant change from previous tracings   Assessment and Plan: Tick bite - Plan: doxycycline (VIBRAMYCIN) 100 MG capsule  Irregular heart beat - Plan: EKG 12-Lead  For the time being reassured that he does not show any sign of more serious tick borne illness.  Give an rx for doxycycline to hold and use if he develops any fevers or other symptoms that concern him- he is to let me know in this case.    EKG shows NSR, so irregular beats likely benign PAC/ PVC  Signed Lamar Blinks, MD

## 2015-09-11 NOTE — Patient Instructions (Signed)
At this time I do not think you have any serious tick borne illness.  However, if you do have any fever or chills or do not feel like the bite is healing well you can start the doxycycline rx. Take this twice a day for 10 days.  If you do start the antibiotic please let me know!

## 2015-09-11 NOTE — Progress Notes (Signed)
Pre visit review using our clinic review tool, if applicable. No additional management support is needed unless otherwise documented below in the visit note. 

## 2015-09-29 ENCOUNTER — Encounter: Payer: Self-pay | Admitting: Cardiology

## 2015-10-23 ENCOUNTER — Other Ambulatory Visit: Payer: Self-pay | Admitting: Internal Medicine

## 2015-11-11 ENCOUNTER — Encounter: Payer: Self-pay | Admitting: Internal Medicine

## 2015-11-15 ENCOUNTER — Ambulatory Visit (INDEPENDENT_AMBULATORY_CARE_PROVIDER_SITE_OTHER): Payer: Commercial Managed Care - HMO | Admitting: Internal Medicine

## 2015-11-15 ENCOUNTER — Encounter: Payer: Self-pay | Admitting: Internal Medicine

## 2015-11-15 VITALS — BP 122/76 | HR 96 | Temp 98.2°F | Resp 14 | Ht 70.0 in | Wt 204.0 lb

## 2015-11-15 DIAGNOSIS — Z0000 Encounter for general adult medical examination without abnormal findings: Secondary | ICD-10-CM

## 2015-11-15 DIAGNOSIS — E785 Hyperlipidemia, unspecified: Secondary | ICD-10-CM

## 2015-11-15 DIAGNOSIS — E119 Type 2 diabetes mellitus without complications: Secondary | ICD-10-CM

## 2015-11-15 DIAGNOSIS — I10 Essential (primary) hypertension: Secondary | ICD-10-CM | POA: Diagnosis not present

## 2015-11-15 DIAGNOSIS — D649 Anemia, unspecified: Secondary | ICD-10-CM | POA: Diagnosis not present

## 2015-11-15 LAB — ALT: ALT: 13 U/L (ref 0–53)

## 2015-11-15 LAB — BASIC METABOLIC PANEL
BUN: 22 mg/dL (ref 6–23)
CALCIUM: 8.6 mg/dL (ref 8.4–10.5)
CO2: 30 mEq/L (ref 19–32)
CREATININE: 1.39 mg/dL (ref 0.40–1.50)
Chloride: 104 mEq/L (ref 96–112)
GFR: 52.8 mL/min — AB (ref >60.00)
Glucose, Bld: 115 mg/dL — ABNORMAL HIGH (ref 70–99)
Potassium: 4.6 mEq/L (ref 3.5–5.1)
SODIUM: 140 meq/L (ref 135–145)

## 2015-11-15 LAB — CBC WITH DIFFERENTIAL/PLATELET
BASOS ABS: 0.1 10*3/uL (ref 0.0–0.1)
Basophils Relative: 0.7 % (ref 0.0–3.0)
EOS PCT: 9.6 % — AB (ref 0.0–5.0)
Eosinophils Absolute: 0.8 10*3/uL — ABNORMAL HIGH (ref 0.0–0.7)
HEMATOCRIT: 41.9 % (ref 39.0–52.0)
Hemoglobin: 13.7 g/dL (ref 13.0–17.0)
LYMPHS ABS: 1.9 10*3/uL (ref 0.7–4.0)
LYMPHS PCT: 23.6 % (ref 12.0–46.0)
MCHC: 32.6 g/dL (ref 30.0–36.0)
MCV: 82.1 fl (ref 78.0–100.0)
MONOS PCT: 9.9 % (ref 3.0–12.0)
Monocytes Absolute: 0.8 10*3/uL (ref 0.1–1.0)
NEUTROS ABS: 4.5 10*3/uL (ref 1.4–7.7)
NEUTROS PCT: 56.2 % (ref 43.0–77.0)
Platelets: 143 10*3/uL — ABNORMAL LOW (ref 150.0–400.0)
RBC: 5.11 Mil/uL (ref 4.22–5.81)
RDW: 14.8 % (ref 11.5–15.5)
WBC: 8.1 10*3/uL (ref 4.0–10.5)

## 2015-11-15 LAB — AST: AST: 14 U/L (ref 0–37)

## 2015-11-15 LAB — HEMOGLOBIN A1C: Hgb A1c MFr Bld: 6.6 % — ABNORMAL HIGH (ref 4.6–6.5)

## 2015-11-15 LAB — LIPID PANEL
Cholesterol: 115 mg/dL (ref 0–200)
HDL: 26.9 mg/dL — AB (ref >39.00)
LDL Cholesterol: 62 mg/dL (ref 0–99)
NONHDL: 88.31
TRIGLYCERIDES: 134 mg/dL (ref 0.0–149.0)
Total CHOL/HDL Ratio: 4
VLDL: 26.8 mg/dL (ref 0.0–40.0)

## 2015-11-15 LAB — IRON: Iron: 46 ug/dL (ref 42–165)

## 2015-11-15 LAB — FERRITIN: FERRITIN: 10.4 ng/mL — AB (ref 22.0–322.0)

## 2015-11-15 MED ORDER — FERROUS SULFATE 325 (65 FE) MG PO TABS: 325.0000 mg | ORAL_TABLET | Freq: Two times a day (BID) | ORAL | 1 refills | Status: DC

## 2015-11-15 NOTE — Progress Notes (Signed)
Pre visit review using our clinic review tool, if applicable. No additional management support is needed unless otherwise documented below in the visit note. 

## 2015-11-15 NOTE — Assessment & Plan Note (Signed)
DM: Diet control, check A1c HTN: Seems controlled, continue amlodipine, metoprolol, Vasotec. Check BMP Hyperlipidemia: On high dose simvastatin, tolerating well, no change for now. Check a FLP, AST, ALT. Iron deficiency anemia: Seen by GI last year, they recommended EGD and colonoscopy after cardiac clearance, procedures  never happened. Check labs today. Implications of iron deficiency anemia discussed with the patient, DDX includes GI malignancy. Re-refer to GI. RLS: sx as described above consistent with RLS in the setting of h/o  iron deficiency. Recommend iron. RTC 4 months

## 2015-11-15 NOTE — Patient Instructions (Signed)
Get your blood work before you leave   Next visit in 4 months, please make an appointment  Consider having a healthcare power of attorney  Start taking iron 325 mg one tablet twice a day    Fall Prevention and Home Safety Falls cause injuries and can affect all age groups. It is possible to use preventive measures to significantly decrease the likelihood of falls. There are many simple measures which can make your home safer and prevent falls. OUTDOORS  Repair cracks and edges of walkways and driveways.  Remove high doorway thresholds.  Trim shrubbery on the main path into your home.  Have good outside lighting.  Clear walkways of tools, rocks, debris, and clutter.  Check that handrails are not broken and are securely fastened. Both sides of steps should have handrails.  Have leaves, snow, and ice cleared regularly.  Use sand or salt on walkways during winter months.  In the garage, clean up grease or oil spills. BATHROOM  Install night lights.  Install grab bars by the toilet and in the tub and shower.  Use non-skid mats or decals in the tub or shower.  Place a plastic non-slip stool in the shower to sit on, if needed.  Keep floors dry and clean up all water on the floor immediately.  Remove soap buildup in the tub or shower on a regular basis.  Secure bath mats with non-slip, double-sided rug tape.  Remove throw rugs and tripping hazards from the floors. BEDROOMS  Install night lights.  Make sure a bedside light is easy to reach.  Do not use oversized bedding.  Keep a telephone by your bedside.  Have a firm chair with side arms to use for getting dressed.  Remove throw rugs and tripping hazards from the floor. KITCHEN  Keep handles on pots and pans turned toward the center of the stove. Use back burners when possible.  Clean up spills quickly and allow time for drying.  Avoid walking on wet floors.  Avoid hot utensils and knives.  Position  shelves so they are not too high or low.  Place commonly used objects within easy reach.  If necessary, use a sturdy step stool with a grab bar when reaching.  Keep electrical cables out of the way.  Do not use floor polish or wax that makes floors slippery. If you must use wax, use non-skid floor wax.  Remove throw rugs and tripping hazards from the floor. STAIRWAYS  Never leave objects on stairs.  Place handrails on both sides of stairways and use them. Fix any loose handrails. Make sure handrails on both sides of the stairways are as long as the stairs.  Check carpeting to make sure it is firmly attached along stairs. Make repairs to worn or loose carpet promptly.  Avoid placing throw rugs at the top or bottom of stairways, or properly secure the rug with carpet tape to prevent slippage. Get rid of throw rugs, if possible.  Have an electrician put in a light switch at the top and bottom of the stairs. OTHER FALL PREVENTION TIPS  Wear low-heel or rubber-soled shoes that are supportive and fit well. Wear closed toe shoes.  When using a stepladder, make sure it is fully opened and both spreaders are firmly locked. Do not climb a closed stepladder.  Add color or contrast paint or tape to grab bars and handrails in your home. Place contrasting color strips on first and last steps.  Learn and use mobility aids as  needed. Install an electrical emergency response system.  Turn on lights to avoid dark areas. Replace light bulbs that burn out immediately. Get light switches that glow.  Arrange furniture to create clear pathways. Keep furniture in the same place.  Firmly attach carpet with non-skid or double-sided tape.  Eliminate uneven floor surfaces.  Select a carpet pattern that does not visually hide the edge of steps.  Be aware of all pets. OTHER HOME SAFETY TIPS  Set the water temperature for 120 F (48.8 C).  Keep emergency numbers on or near the telephone.  Keep  smoke detectors on every level of the home and near sleeping areas. Document Released: 02/08/2002 Document Revised: 08/20/2011 Document Reviewed: 05/10/2011 [facility] Patient Information 2015 [place], [place]. This information is not intended to replace advice given to you by your health care provider. Make sure you discuss any questions you have with your health care provider.   Preventive Care for Adults Ages 6 and over  Blood pressure check.** / Every 1 to 2 years.  Lipid and cholesterol check.**/ Every 5 years beginning at age 38.  Lung cancer screening. / Every year if you are aged 50-80 years and have a 30-pack-year history of smoking and currently smoke or have quit within the past 15 years. Yearly screening is stopped once you have quit smoking for at least 15 years or develop a health problem that would prevent you from having lung cancer treatment.  Fecal occult blood test (FOBT) of stool. / Every year beginning at age 11 and continuing until age 63. You may not have to do this test if you get a colonoscopy every 10 years.  Flexible sigmoidoscopy** or colonoscopy.** / Every 5 years for a flexible sigmoidoscopy or every 10 years for a colonoscopy beginning at age 21 and continuing until age 43.  Hepatitis C blood test.** / For all people born from [birth year] through 1966 and any individual with known risks for hepatitis C.  Abdominal aortic aneurysm (AAA) screening.** / A one-time screening for ages 23 to 75 years who are current or former smokers.  Skin self-exam. / Monthly.  Influenza vaccine. / Every year.  Tetanus, diphtheria, and acellular pertussis (Tdap/Td) vaccine.** / 1 dose of Td every 10 years.  Varicella vaccine.** / Consult your health care provider.  Zoster vaccine.** / 1 dose for adults aged 65 years or older.  Pneumococcal 13-valent conjugate (PCV13) vaccine.** / Consult your health care provider.  Pneumococcal polysaccharide (PPSV23) vaccine.** / 1 dose for all  adults aged 50 years and older.  Meningococcal vaccine.** / Consult your health care provider.  Hepatitis A vaccine.** / Consult your health care provider.  Hepatitis B vaccine.** / Consult your health care provider.  Haemophilus influenzae type b (Hib) vaccine.** / Consult your health care provider. **Family history and personal history of risk and conditions may change your health care provider's recommendations. Document Released: 04/16/2001 Document Revised: 02/23/2013 Document Reviewed: 07/16/2010 [facility] Patient Information 2015 [place], [place]. This information is not intended to replace advice given to you by your health care provider. Make sure you discuss any questions you have with your health care provider.

## 2015-11-15 NOTE — Progress Notes (Signed)
Subjective:    Patient ID: [name] Little, male    DOB: 1940/03/19, 75 y.o.   MRN: [id number]  DOS:  11/15/2015 Type of visit - description : CPX Interval history:  DM, on diet control, no ambulatory CBGs HTN: Ambulatory BPs 120, 130 Complaining of pain at the legs L>R, only nocturnal, no claudication, from the hip to the foot, not described as cramping, decrease by walking around the room. No back pain or lower extremity edema. Iron deficiency anemia: did not pursue scopes.  Review of Systems Constitutional: No fever. No chills. No unexplained wt changes. No unusual sweats  HEENT: No dental problems, no ear discharge, no facial swelling, no voice changes. No eye discharge, no eye  redness , no  intolerance to light   Respiratory: No wheezing , no  difficulty breathing. Occasionally dry cough at night, not associated with wheezing  Cardiovascular: No CP, no leg swelling , no  Palpitations  GI: no nausea, no vomiting, no diarrhea , no  abdominal pain.  No blood in the stools. No dysphagia, no odynophagia    Endocrine: No polyphagia, no polyuria , no polydipsia  GU: No dysuria, gross hematuria, difficulty urinating. No urinary urgency, no frequency.  Musculoskeletal: No joint swellings or unusual aches or pains  Skin: No change in the color of the skin, palor , no  Rash  Allergic, immunologic: Occasionally has environmental allergies , no  food allergies  Neurological: No dizziness no  syncope. No headaches. No diplopia, no slurred, no slurred speech, no motor deficits, no facial  Numbness  Hematological: No enlarged lymph nodes, no easy bruising , no unusual bleedings  Psychiatry: No suicidal ideas, no hallucinations, no beavior problems, no confusion.  No unusual/severe anxiety, no depression   Past Medical History:  Diagnosis Date  . Allergic rhinitis   . Anxiety and depression   . Arthritis   . CAD (coronary artery disease)   . Diabetes mellitus   . GERD  (gastroesophageal reflux disease)   . Headache(784.0) 06/2003   s/p neuro eval ? migraine  . Hyperlipidemia   . Hyperplastic colon polyp 2008  . Hypertension   . Iron deficiency    h/o  . Ischemic cardiomyopathy    EF had 30-35%. The most recent EF was 50% on echo in september 2009  . PUD (peptic ulcer disease) 7/11   EGD showed few small ulcers, esophagues strecthed    Past Surgical History:  Procedure Laterality Date  . CATARACT EXTRACTION Right 07-2012  . CORONARY ARTERY BYPASS GRAFT  2004   LIMA to the LADs, sequentail SVG to intermediated and obtuse marginal, sequential SVG to PDA and posterolaterla  . INGUINAL HERNIA REPAIR Bilateral   . TONSILLECTOMY      Social History   Social History  . Marital status: Married    Spouse name: N/A  . Number of children: 0  . Years of education: N/A   Occupational History  . retired Freight forwarder     Social History Main Topics  . Smoking status: Former Smoker    Types: Cigarettes    Quit date: 12/16/1976  . Smokeless tobacco: Never Used  . Alcohol use No  . Drug use: No  . Sexual activity: Not on file   Other Topics Concern  . Not on file   Social History Narrative   Married , lives w/ wife , no children           Family History  Problem Relation Age of Onset  .  Diabetes Father   . Heart attack Father   . Heart attack Mother   . Cancer Paternal Grandfather     type unknown  . Colon cancer Neg Hx   . Prostate cancer Neg Hx        Medication List       Accurate as of 11/15/15  8:04 AM. Always use your most recent med list.          amLODipine 2.5 MG tablet Commonly known as:  NORVASC Take 1 tablet (2.5 mg total) by mouth daily.   aspirin 81 MG chewable tablet 2 daily.   co-enzyme Q-10 50 MG capsule Take 400 mg by mouth daily.   doxycycline 100 MG capsule Commonly known as:  VIBRAMYCIN Take 1 capsule (100 mg total) by mouth 2 (two) times daily.   enalapril 10 MG tablet Commonly known as:   VASOTEC Take 1 tablet (10 mg total) by mouth 2 (two) times daily.   fish oil-omega-3 fatty acids 1000 MG capsule Take 2 g by mouth daily.   FLAX SEED OIL PO Take by mouth.   metoprolol 100 MG tablet Commonly known as:  LOPRESSOR Take 1 tablet (100 mg total) by mouth 2 (two) times daily.   omeprazole 40 MG capsule Commonly known as:  PRILOSEC Take 1 capsule (40 mg total) by mouth daily.   simvastatin 80 MG tablet Commonly known as:  ZOCOR Take 1 tablet (80 mg total) by mouth at bedtime.   TYLENOL 8 HOUR PO Take 1 tablet by mouth as needed.          Objective:   Physical Exam BP 122/76 (BP Location: Left Arm, Patient Position: Sitting, Cuff Size: Normal)   Pulse 96   Temp 98.2 F (36.8 C) (Oral)   Resp 14   Ht 5\' 10"  ([id number] m)   Wt 204 lb (92.5 kg)   SpO2 96%   BMI 29.27 kg/m   General:   Well developed, well nourished . NAD.  Neck: No  thyromegaly  HEENT:  Normocephalic . Face symmetric, atraumatic Lungs:  CTA B Normal respiratory effort, no intercostal retractions, no accessory muscle use. Heart: RRR,  no murmur.  No pretibial edema bilaterally  Normal femoral pulses , slt decrease pedal pulses Abdomen:  Not distended, soft, non-tender. No rebound or rigidity.  No bruit  Skin: Exposed areas without rash. Not pale. Not jaundice Neurologic:  alert & oriented X3.  Speech normal, gait appropriate for age and unassisted Strength symmetric and appropriate for age.  Psych: Cognition and judgment appear intact.  Cooperative with normal attention span and concentration.  Behavior appropriate. No anxious or depressed appearing.    Assessment & Plan:   Assessment > DM with neuropathy (Occasional numbness, normal pinprick exam 06-2015) HTN: rx lasix 01-2015, creat increased, d/c lasix Hyperlipidemia Anxiety, depression, insomnia Chronic fatigue Chronic renal insufficiency   CV:  --CAD -- carotid artery disease, US done 10-2014 Hypogonadism DJD    Thrombocytopenia History of a neck mass, evaluated by ENT remotely  PLAN: DM: Diet control, check A1c HTN: Seems controlled, continue amlodipine, metoprolol, Vasotec. Check BMP Hyperlipidemia: On high dose simvastatin, tolerating well, no change for now. Check a FLP, AST, ALT. Iron deficiency anemia: Seen by GI last year, they recommended EGD and colonoscopy after cardiac clearance, procedures  never happened. Check labs today. Implications of iron deficiency anemia discussed with the patient, DDX includes GI malignancy. Re-refer to GI. RLS: sx as described above consistent with RLS in the setting of  h/o  iron deficiency. Recommend iron. RTC 4 months

## 2015-11-15 NOTE — Assessment & Plan Note (Addendum)
Td 09 ; pneumonia shot  2013 ; prevnar 2015; Shingles Shot--2016, had a flu shot 2 days ago at [facility]:   colonoscopy 03/2006, 2 hyperplastic polyps, Next 10 years  Prostate cancer screening: DRE - PSA within normal 2016 Counseled: Fall prevention, healthcare power of attorney. Exercise-diet

## 2015-11-23 ENCOUNTER — Encounter: Payer: Self-pay | Admitting: Internal Medicine

## 2015-12-05 ENCOUNTER — Telehealth: Payer: Self-pay | Admitting: Internal Medicine

## 2015-12-05 DIAGNOSIS — I739 Peripheral vascular disease, unspecified: Principal | ICD-10-CM

## 2015-12-05 DIAGNOSIS — I779 Disorder of arteries and arterioles, unspecified: Secondary | ICD-10-CM

## 2015-12-05 NOTE — Telephone Encounter (Signed)
Patient is due for a carotid ultrasound, please arrange, DX carotid artery disease

## 2015-12-05 NOTE — Telephone Encounter (Signed)
Order placed

## 2015-12-11 ENCOUNTER — Encounter: Payer: Self-pay | Admitting: Cardiology

## 2015-12-24 NOTE — Progress Notes (Signed)
HPI The patient returns for one year follow up.  Last year he had some trouble with dizziness.  Echocardiogram demonstrated that his EF was slightly low at 45-50%. There were no other significant abnormalities.  He returns for follow up.  He says he is done well since I last saw him. He does cough occasionally. This happens only at night and not during the rest of the day. He's doing yard work and he has to new dogs that he walks and chases after when one of them gets out. He has no problems with this.   Allergies  Allergen Reactions  . Micardis [Telmisartan]     REACTION: diarrhea    Current Outpatient Prescriptions  Medication Sig Dispense Refill  . Acetaminophen (TYLENOL 8 HOUR PO) Take 1 tablet by mouth as needed.    Marland Kitchen amLODipine (NORVASC) 2.5 MG tablet Take 1 tablet (2.5 mg total) by mouth daily. 90 tablet 1  . aspirin 81 MG chewable tablet 2 daily.     Marland Kitchen co-enzyme Q-10 50 MG capsule Take 400 mg by mouth daily.     . enalapril (VASOTEC) 10 MG tablet Take 1 tablet (10 mg total) by mouth 2 (two) times daily. 60 tablet 6  . ferrous sulfate 325 (65 FE) MG tablet Take 1 tablet (325 mg total) by mouth 2 (two) times daily with a meal. 180 tablet 1  . fish oil-omega-3 fatty acids 1000 MG capsule Take 2 g by mouth daily.      . Flaxseed, Linseed, (FLAX SEED OIL PO) Take by mouth.     . metoprolol (LOPRESSOR) 100 MG tablet Take 1 tablet (100 mg total) by mouth 2 (two) times daily. 180 tablet 1  . omeprazole (PRILOSEC) 40 MG capsule Take 1 capsule (40 mg total) by mouth daily. 90 capsule 1  . simvastatin (ZOCOR) 80 MG tablet Take 1 tablet (80 mg total) by mouth at bedtime. 90 tablet 2   No current facility-administered medications for this visit.     Past Medical History:  Diagnosis Date  . Allergic rhinitis   . Anxiety and depression   . Arthritis   . CAD (coronary artery disease)   . Diabetes mellitus   . GERD (gastroesophageal reflux disease)   . Headache(784.0) 06/2003   s/p  neuro eval ? migraine  . Hyperlipidemia   . Hyperplastic colon polyp 2008  . Hypertension   . Iron deficiency    h/o  . Ischemic cardiomyopathy    EF had 30-35%. The most recent EF was 50% on echo in september 2009  . PUD (peptic ulcer disease) 7/11   EGD showed few small ulcers, esophagues strecthed    Past Surgical History:  Procedure Laterality Date  . CATARACT EXTRACTION Right 07-2012  . CORONARY ARTERY BYPASS GRAFT  2004   LIMA to the LADs, sequentail SVG to intermediated and obtuse marginal, sequential SVG to PDA and posterolaterla  . INGUINAL HERNIA REPAIR Bilateral   . TONSILLECTOMY      ROS: As stated in the HPI and negative for all other systems.  PHYSICAL EXAM BP (!) 164/80 (BP Location: Left Arm, Patient Position: Sitting, Cuff Size: Normal)   Pulse 71   Ht 5' 10.5" (1.791 m)   Wt 203 lb 6.4 oz (92.3 kg)   BMI 28.77 kg/m  GENERAL:  Well appearing HEENT:  Pupils equal round and reactive, fundi not visualized, oral mucosa unremarkable, dentures NECK:  No jugular venous distention, waveform within normal limits, carotid upstroke brisk and  symmetric, no bruits, no thyromegaly LUNGS:  Clear to auscultation bilaterally BACK:  No CVA tenderness CHEST:  Unremarkable HEART:  PMI not displaced or sustained,S1 and S2 within normal limits, no S3, no S4, no clicks, no rubs, no murmurs ABD:  Flat, positive bowel sounds normal in frequency in pitch, no bruits, no rebound, no guarding, no midline pulsatile mass, no hepatomegaly, no splenomegaly EXT:  2 plus pulses throughout, mild edema, no cyanosis no clubbing    Lab Results  Component Value Date   CHOL 115 11/15/2015   TRIG 134.0 11/15/2015   HDL 26.90 (L) 11/15/2015   LDLCALC 62 11/15/2015     ASSESSMENT AND PLAN  CAD -  The patient has no new sypmtoms.  No further cardiovascular testing is indicated.  We will continue with aggressive risk reduction and meds as listed.  HYPERTENSION -  His BP is slightly  elevated.  He will keep a BP diary.  Further changes will be based on these readings.   HYPERLIPIDEMIA -  At the last visit I stopped his niacin. His lipids were excellent as above. He will continue the meds as listed.  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  He is going to have this repeated today.

## 2015-12-25 ENCOUNTER — Ambulatory Visit (INDEPENDENT_AMBULATORY_CARE_PROVIDER_SITE_OTHER): Payer: Commercial Managed Care - HMO | Admitting: Cardiology

## 2015-12-25 ENCOUNTER — Encounter: Payer: Self-pay | Admitting: Cardiology

## 2015-12-25 ENCOUNTER — Ambulatory Visit (HOSPITAL_COMMUNITY)
Admission: RE | Admit: 2015-12-25 | Discharge: 2015-12-25 | Disposition: A | Discharge status: Home / Self Care | Payer: Commercial Managed Care - HMO | Source: Ambulatory Visit | Attending: Cardiology | Admitting: Cardiology

## 2015-12-25 VITALS — BP 164/80 | HR 71 | Ht 70.5 in | Wt 203.4 lb

## 2015-12-25 DIAGNOSIS — I2510 Atherosclerotic heart disease of native coronary artery without angina pectoris: Secondary | ICD-10-CM | POA: Diagnosis not present

## 2015-12-25 DIAGNOSIS — I779 Disorder of arteries and arterioles, unspecified: Secondary | ICD-10-CM | POA: Diagnosis not present

## 2015-12-25 DIAGNOSIS — I739 Peripheral vascular disease, unspecified: Secondary | ICD-10-CM

## 2015-12-25 DIAGNOSIS — I6523 Occlusion and stenosis of bilateral carotid arteries: Secondary | ICD-10-CM

## 2015-12-25 DIAGNOSIS — I10 Essential (primary) hypertension: Secondary | ICD-10-CM | POA: Diagnosis not present

## 2015-12-25 NOTE — Patient Instructions (Signed)

## 2015-12-26 ENCOUNTER — Encounter: Payer: Self-pay | Admitting: Cardiology

## 2015-12-27 ENCOUNTER — Encounter: Payer: Self-pay | Admitting: Cardiology

## 2016-01-17 ENCOUNTER — Encounter: Payer: Self-pay | Admitting: Internal Medicine

## 2016-01-22 ENCOUNTER — Other Ambulatory Visit: Payer: Self-pay | Admitting: Internal Medicine

## 2016-02-05 ENCOUNTER — Ambulatory Visit (INDEPENDENT_AMBULATORY_CARE_PROVIDER_SITE_OTHER): Payer: Commercial Managed Care - HMO | Admitting: Gastroenterology

## 2016-02-05 ENCOUNTER — Encounter: Payer: Self-pay | Admitting: Gastroenterology

## 2016-02-05 VITALS — BP 136/66 | HR 60 | Ht 68.0 in | Wt 200.1 lb

## 2016-02-05 DIAGNOSIS — D509 Iron deficiency anemia, unspecified: Secondary | ICD-10-CM | POA: Diagnosis not present

## 2016-02-05 NOTE — Patient Instructions (Signed)
Call if you are concerned about any GI issues.

## 2016-02-05 NOTE — Progress Notes (Signed)
Review of pertinent gastrointestinal problems: 1. Routine risk for colon cancer: colonoscopy Dr. Ardis Hughs  in October 2008 for mild iron deficiency anemia. He was found to have just 2 small polyps which were removed and found to be hyperplastic.  2. Gastric ulcers: EGD in July 2011 which showed several small prepyloric ulcers minimal peptic stricture which was dilated; biopsies no neoplasm or infection: recommended recall at 2-3 months.   HPI: This is a   pleasant 75 year old man who was here in our office about a year ago.   He was found to have very mild iron deficiency anemia about a year ago. He was seen here in our office by Amy. She recommended colonoscopy and upper endoscopy. He canceled those scheduled appointments and we have not heard from him since then.  Most recent Hb is 13.7, MCV 82  Chief complaint is mild iron deficiency anemia  Has been on iron for a year or so;   He has no problem with GI issues.  His wife and he do not feel like he needs a colonoscopy now.    He really does not want any GI testing.  Overall stable weigh.  ROS: complete GI ROS as described in HPI.  Constitutional:  No unintentional weight loss   Past Medical History:  Diagnosis Date  . Allergic rhinitis   . Anxiety and depression   . Arthritis   . CAD (coronary artery disease)   . Diabetes mellitus   . GERD (gastroesophageal reflux disease)   . Headache(784.0) 06/2003   s/p neuro eval ? migraine  . Hyperlipidemia   . Hyperplastic colon polyp 2008  . Hypertension   . Iron deficiency    h/o  . Ischemic cardiomyopathy    EF had 30-35%. The most recent EF was 50% on echo in september 2009  . PUD (peptic ulcer disease) 7/11   EGD showed few small ulcers, esophagues strecthed    Past Surgical History:  Procedure Laterality Date  . CATARACT EXTRACTION Right 07-2012  . CORONARY ARTERY BYPASS GRAFT  2004   LIMA to the LADs, sequentail SVG to intermediated and obtuse marginal, sequential SVG to  PDA and posterolaterla  . INGUINAL HERNIA REPAIR Bilateral   . TONSILLECTOMY      Current Outpatient Prescriptions  Medication Sig Dispense Refill  . Acetaminophen (TYLENOL 8 HOUR PO) Take 1 tablet by mouth as needed.    Marland Kitchen amLODipine (NORVASC) 2.5 MG tablet Take 1 tablet (2.5 mg total) by mouth daily. 90 tablet 1  . aspirin 81 MG chewable tablet 2 daily.     . Coenzyme Q10 (COQ10) 400 MG CAPS Take 1 capsule by mouth daily.    . enalapril (VASOTEC) 10 MG tablet Take 1 tablet (10 mg total) by mouth 2 (two) times daily. 60 tablet 6  . ferrous sulfate 325 (65 FE) MG tablet Take 1 tablet (325 mg total) by mouth 2 (two) times daily with a meal. (Patient taking differently: Take 325 mg by mouth daily with breakfast. ) 180 tablet 1  . fish oil-omega-3 fatty acids 1000 MG capsule Take 1 g by mouth daily.     . Flaxseed, Linseed, (FLAX SEED OIL PO) Take by mouth.     . metoprolol (LOPRESSOR) 100 MG tablet Take 1 tablet (100 mg total) by mouth 2 (two) times daily. 180 tablet 1  . omeprazole (PRILOSEC) 40 MG capsule Take 1 capsule (40 mg total) by mouth daily. 90 capsule 1  . simvastatin (ZOCOR) 80 MG tablet Take  1 tablet (80 mg total) by mouth at bedtime. 90 tablet 2   No current facility-administered medications for this visit.     Allergies as of 02/05/2016 - Review Complete 02/05/2016  Allergen Reaction Noted  . Micardis [telmisartan]  10/15/2006    Family History  Problem Relation Age of Onset  . Diabetes Father   . Heart attack Father   . Heart attack Mother   . Cancer Paternal Grandfather     type unknown  . Colon cancer Neg Hx   . Prostate cancer Neg Hx     Social History   Social History  . Marital status: Married    Spouse name: N/A  . Number of children: 0  . Years of education: N/A   Occupational History  . retired Freight forwarder     Social History Main Topics  . Smoking status: Former Smoker    Types: Cigarettes    Quit date: 12/16/1976  . Smokeless tobacco: Never Used   . Alcohol use No  . Drug use: No  . Sexual activity: Not on file   Other Topics Concern  . Not on file   Social History Narrative   Married , lives w/ wife , no children           Physical Exam: BP 136/66 (BP Location: Left Arm, Patient Position: Sitting, Cuff Size: Normal)   Pulse 60 Comment: irregular  Ht 5\' 8"  (1.727 m)   Wt 200 lb 2 oz (90.8 kg)   BMI 30.43 kg/m  Constitutional: generally well-appearing Psychiatric: alert and oriented x3 Abdomen: soft, nontender, nondistended, no obvious ascites, no peritoneal signs, normal bowel sounds No peripheral edema noted in lower extremities  Assessment and plan: 75 y.o. male with mild iron deficiency anemia  He is really not interested in any kind of testing for his mild iron deficiency. He was seen here in our office about a year ago for the same and was recommended to consider colonoscopy and possibly upper endoscopy. He canceled both of those appointments. I do have to admit his blood counts are only very slightly low off of iron and when he takes a single iron supplement once daily his blood counts are completely normal. I did recommend we go ahead with colonoscopy again for his mild iron deficiency anemia but he is not interested. He says he would probably get divorced from his wife since she is adamant he not have one as well at this time. I offered stool testing such as Colo guard or FOBT but he said he will probably have that done by his primary care office next month and he is not interested in meeting that for him now. I explained to him that if he has any concerns about his digestive tract, GI troubles he should please call here to see him again anytime.   [name] Loffler, MD [facility] 02/05/2016, 10:46 AM

## 2016-03-07 ENCOUNTER — Encounter: Payer: Self-pay | Admitting: Internal Medicine

## 2016-03-07 ENCOUNTER — Encounter: Payer: Self-pay | Admitting: Cardiology

## 2016-03-07 MED ORDER — SIMVASTATIN 80 MG PO TABS: 80.0000 mg | ORAL_TABLET | Freq: Every day | ORAL | 1 refills | Status: DC

## 2016-03-07 MED ORDER — AMLODIPINE BESYLATE 2.5 MG PO TABS: 2.5000 mg | ORAL_TABLET | Freq: Every day | ORAL | 1 refills | Status: DC

## 2016-03-07 MED ORDER — OMEPRAZOLE 40 MG PO CPDR: 40.0000 mg | DELAYED_RELEASE_CAPSULE | Freq: Every day | ORAL | 1 refills | Status: DC

## 2016-03-07 MED ORDER — METOPROLOL TARTRATE 100 MG PO TABS: 100.0000 mg | ORAL_TABLET | Freq: Two times a day (BID) | ORAL | 1 refills | Status: DC

## 2016-03-13 ENCOUNTER — Encounter: Payer: Self-pay | Admitting: Cardiology

## 2016-03-14 MED ORDER — ENALAPRIL MALEATE 10 MG PO TABS: 10.0000 mg | ORAL_TABLET | Freq: Two times a day (BID) | ORAL | 2 refills | Status: DC

## 2016-03-15 ENCOUNTER — Encounter: Payer: Self-pay | Admitting: Internal Medicine

## 2016-03-20 ENCOUNTER — Ambulatory Visit: Payer: Self-pay | Admitting: Internal Medicine

## 2016-07-05 ENCOUNTER — Telehealth: Payer: Self-pay | Admitting: Internal Medicine

## 2016-07-05 NOTE — Telephone Encounter (Signed)
Both meds have been refilled within the last 6 months.   Enalapril refilled by Dr. Percival Spanish 03/14/2016 #180 and 2RF Simvastatin refilled by PCP 03/07/2016 #90 and 1RF

## 2016-07-05 NOTE — Telephone Encounter (Signed)
Caller name: Burnett Corrente with [facility] Can be reached: [phone]  Reason for call: Pt 1 month past due for refills on enalapril and simvastatin. They have left msgs and no return calls from pt. They are notifying provider that pt is past due and may be out of meds.

## 2016-07-06 ENCOUNTER — Encounter: Payer: Self-pay | Admitting: Internal Medicine

## 2016-07-08 MED ORDER — AMLODIPINE BESYLATE 2.5 MG PO TABS: 2.5000 mg | ORAL_TABLET | Freq: Every day | ORAL | 0 refills | Status: DC

## 2016-07-08 MED ORDER — METOPROLOL TARTRATE 100 MG PO TABS: 100.0000 mg | ORAL_TABLET | Freq: Two times a day (BID) | ORAL | 0 refills | Status: DC

## 2016-07-08 MED ORDER — SIMVASTATIN 80 MG PO TABS: 80.0000 mg | ORAL_TABLET | Freq: Every day | ORAL | 0 refills | Status: DC

## 2016-07-10 DIAGNOSIS — H52223 Regular astigmatism, bilateral: Secondary | ICD-10-CM | POA: Diagnosis not present

## 2016-07-10 DIAGNOSIS — H04123 Dry eye syndrome of bilateral lacrimal glands: Secondary | ICD-10-CM | POA: Diagnosis not present

## 2016-07-10 DIAGNOSIS — H5203 Hypermetropia, bilateral: Secondary | ICD-10-CM | POA: Diagnosis not present

## 2016-07-10 DIAGNOSIS — H25812 Combined forms of age-related cataract, left eye: Secondary | ICD-10-CM | POA: Diagnosis not present

## 2016-07-10 DIAGNOSIS — H1045 Other chronic allergic conjunctivitis: Secondary | ICD-10-CM | POA: Diagnosis not present

## 2016-07-10 DIAGNOSIS — Z961 Presence of intraocular lens: Secondary | ICD-10-CM | POA: Diagnosis not present

## 2016-07-17 ENCOUNTER — Ambulatory Visit (INDEPENDENT_AMBULATORY_CARE_PROVIDER_SITE_OTHER): Payer: Medicare HMO | Admitting: Internal Medicine

## 2016-07-17 ENCOUNTER — Encounter: Payer: Self-pay | Admitting: Internal Medicine

## 2016-07-17 VITALS — BP 147/67 | HR 60 | Temp 97.9°F | Wt 201.2 lb

## 2016-07-17 DIAGNOSIS — M79604 Pain in right leg: Secondary | ICD-10-CM | POA: Diagnosis not present

## 2016-07-17 DIAGNOSIS — E1140 Type 2 diabetes mellitus with diabetic neuropathy, unspecified: Secondary | ICD-10-CM | POA: Diagnosis not present

## 2016-07-17 DIAGNOSIS — I10 Essential (primary) hypertension: Secondary | ICD-10-CM | POA: Diagnosis not present

## 2016-07-17 DIAGNOSIS — D649 Anemia, unspecified: Secondary | ICD-10-CM | POA: Diagnosis not present

## 2016-07-17 NOTE — Patient Instructions (Signed)
GO TO THE LAB : Get the blood work     GO TO THE FRONT DESK Schedule your next appointment for a  Physical exam in 6 months  Also , schedule a Medicare wellness exam one of our nurses at your convenience

## 2016-07-17 NOTE — Progress Notes (Signed)
Subjective:    Patient ID: [name] Little, male    DOB: 20-Jan-1941, 76 y.o.   MRN: [id number]  DOS:  07/17/2016 Type of visit - description : Routine visit Interval history: History of anemia, GI note reviewed HTN, good medication compliance, BP today 147/67, ambulatory BPs in the 120s Has noted pain at the R thigh,  lateral aspect, mostly when he walks, decrease with rest. Symptoms started 2 weeks ago, mild. Denies claudication per se (no pain at the calf). DM: Diet control, due for A1c  Review of Systems  Denies chest pain or difficulty breathing. No nausea, vomiting, diarrhea  Past Medical History:  Diagnosis Date  . Allergic rhinitis   . Anxiety and depression   . Arthritis   . CAD (coronary artery disease)   . Diabetes mellitus   . GERD (gastroesophageal reflux disease)   . Headache(784.0) 06/2003   s/p neuro eval ? migraine  . Hyperlipidemia   . Hyperplastic colon polyp 2008  . Hypertension   . Iron deficiency    h/o  . Ischemic cardiomyopathy    EF had 30-35%. The most recent EF was 50% on echo in september 2009  . PUD (peptic ulcer disease) 7/11   EGD showed few small ulcers, esophagues strecthed    Past Surgical History:  Procedure Laterality Date  . CATARACT EXTRACTION Right 07-2012  . CORONARY ARTERY BYPASS GRAFT  2004   LIMA to the LADs, sequentail SVG to intermediated and obtuse marginal, sequential SVG to PDA and posterolaterla  . INGUINAL HERNIA REPAIR Bilateral   . TONSILLECTOMY      Social History   Social History  . Marital status: Married    Spouse name: N/A  . Number of children: 0  . Years of education: N/A   Occupational History  . retired Freight forwarder     Social History Main Topics  . Smoking status: Former Smoker    Types: Cigarettes    Quit date: 12/16/1976  . Smokeless tobacco: Never Used  . Alcohol use No  . Drug use: No  . Sexual activity: Not on file   Other Topics Concern  . Not on file   Social History Narrative   Married , lives w/ wife , no children            Allergies as of 07/17/2016      Reactions   Micardis [telmisartan]    REACTION: diarrhea      Medication List       Accurate as of 07/17/16 11:59 PM. Always use your most recent med list.          amLODipine 2.5 MG tablet Commonly known as:  NORVASC Take 1 tablet (2.5 mg total) by mouth daily.   aspirin 81 MG chewable tablet 2 daily.   CoQ10 400 MG Caps Take 1 capsule by mouth daily.   enalapril 10 MG tablet Commonly known as:  VASOTEC Take 1 tablet (10 mg total) by mouth 2 (two) times daily.   ferrous sulfate 325 (65 FE) MG tablet Take 325 mg by mouth daily with breakfast.   fish oil-omega-3 fatty acids 1000 MG capsule Take 1 g by mouth daily.   FLAX SEED OIL PO Take by mouth.   metoprolol tartrate 100 MG tablet Commonly known as:  LOPRESSOR Take 1 tablet (100 mg total) by mouth 2 (two) times daily.   omeprazole 40 MG capsule Commonly known as:  PRILOSEC Take 1 capsule (40 mg total) by mouth daily.  simvastatin 80 MG tablet Commonly known as:  ZOCOR Take 1 tablet (80 mg total) by mouth at bedtime.   TYLENOL 8 HOUR PO Take 1 tablet by mouth as needed.          Objective:   Physical Exam  Musculoskeletal:       Legs:  BP (!) 147/67 (BP Location: Left Arm, Patient Position: Sitting, Cuff Size: Normal)   Pulse 60   Temp 97.9 F (36.6 C) (Oral)   Wt 201 lb 3.2 oz (91.3 kg)   SpO2 96% Comment: RA  BMI 30.59 kg/m  General:   Well developed, well nourished . NAD.  HEENT:  Normocephalic . Face symmetric, atraumatic Lungs:  CTA B Normal respiratory effort, no intercostal retractions, no accessory muscle use. Heart: RRR,  no murmur.  Trace pretibial edema bilaterally  Abdomen:  Not distended, soft, non-tender. No rebound or rigidity. No bruit. Lower extremities: Right femoral and pedal pulses palpable but slightly decreased compared to the left. Left pulses normal. MSK: Hip rotation normal  bilaterally Skin: Not pale. Not jaundice Neurologic:  alert & oriented X3.  Speech normal, gait appropriate for age and unassisted Psych--  Cognition and judgment appear intact.  Cooperative with normal attention span and concentration.  Behavior appropriate. No anxious or depressed appearing.    Assessment & Plan:    Assessment  DM with neuropathy (Occasional numbness, normal pinprick exam 06-2015) HTN: rx lasix 01-2015, creat increased, d/c lasix Hyperlipidemia Anxiety, depression, insomnia Chronic fatigue Chronic renal insufficiency   CV:  --CAD -- carotid artery disease, US done 10-2014 Hypogonadism DJD   Thrombocytopenia History of a neck mass, evaluated by ENT remotely  PLAN: DM: Diet control, check A1c HTN: Continue amlodipine, Vasotec, metoprolol. Check a BMP Anemia: Saw GI 02-2016,note reviewed,  declined further evaluation.  Leg pain: Started 2 weeks ago, symptoms no classic for claudication or DJD. We talk about  ABIs, Declined for now. Will call if symptoms persist or increase. RTC 6 months CPX.  Also Medicare wellness at his convenience

## 2016-07-17 NOTE — Progress Notes (Signed)
Pre visit review using our clinic review tool, if applicable. No additional management support is needed unless otherwise documented below in the visit note. 

## 2016-07-18 LAB — BASIC METABOLIC PANEL
BUN: 16 mg/dL (ref 6–23)
CHLORIDE: 103 meq/L (ref 96–112)
CO2: 26 meq/L (ref 19–32)
CREATININE: 1.2 mg/dL (ref 0.40–1.50)
Calcium: 9.1 mg/dL (ref 8.4–10.5)
GFR: 62.45 mL/min (ref >60.00)
GLUCOSE: 112 mg/dL — AB (ref 70–99)
POTASSIUM: 3.9 meq/L (ref 3.5–5.1)
Sodium: 137 mEq/L (ref 135–145)

## 2016-07-18 LAB — HEMOGLOBIN A1C: HEMOGLOBIN A1C: 6.4 % (ref 4.6–6.5)

## 2016-07-18 NOTE — Assessment & Plan Note (Signed)
DM: Diet control, check A1c HTN: Continue amlodipine, Vasotec, metoprolol. Check a BMP Anemia: Saw GI 02-2016,note reviewed,  declined further evaluation.  Leg pain: Started 2 weeks ago, symptoms no classic for claudication or DJD. We talk about  ABIs, Declined for now. Will call if symptoms persist or increase. RTC 6 months CPX.  Also Medicare wellness at his convenience

## 2016-07-29 ENCOUNTER — Encounter: Payer: Self-pay | Admitting: Internal Medicine

## 2016-08-08 ENCOUNTER — Encounter: Payer: Self-pay | Admitting: Internal Medicine

## 2016-08-08 ENCOUNTER — Ambulatory Visit (INDEPENDENT_AMBULATORY_CARE_PROVIDER_SITE_OTHER): Payer: Medicare HMO | Admitting: Internal Medicine

## 2016-08-08 VITALS — BP 126/68 | HR 58 | Temp 98.2°F | Resp 14 | Ht 68.0 in | Wt 200.2 lb

## 2016-08-08 DIAGNOSIS — R200 Anesthesia of skin: Secondary | ICD-10-CM | POA: Diagnosis not present

## 2016-08-08 NOTE — Patient Instructions (Signed)
Continue the same medications  We are referring you to the neurologist  If the symptoms get more frequent or the last more than 20 minutes: Call or go to the ER.

## 2016-08-08 NOTE — Assessment & Plan Note (Signed)
R Hand numbness, tightness at the R  triceps. The patient has multiple CV RF, last carotid ultrasound 12-2015, stable at 40-59% blockage. Sx could be vascular versus neurological (radiculopathy?). Neurological exam is nonfocal at this point.  Recommend to continue aspirin 162 mg daily, will refer to neurology to clarify sx etiology. ER if symptoms increase or severe

## 2016-08-08 NOTE — Progress Notes (Signed)
Pre visit review using our clinic review tool, if applicable. No additional management support is needed unless otherwise documented below in the visit note. 

## 2016-08-08 NOTE — Progress Notes (Signed)
Subjective:    Patient ID: [name] Little, male    DOB: 11-18-1940, 76 y.o.   MRN: [id number]  DOS:  08/08/2016 Type of visit - description : Acute visit Interval history: For the last 4 weeks is having symptoms on the right upper extremity: The triceps area gets tight and is associated with the "whole hand" getting numb. Symptoms are not associated with neck pain, slurred speech, face numbness, diplopia. No chest pain - palpitations. No nausea or vomiting. No new symptoms at the right lower extremity, occasional pain.  Review of Systems  As above.  Past Medical History:  Diagnosis Date  . Allergic rhinitis   . Anxiety and depression   . Arthritis   . CAD (coronary artery disease)   . Diabetes mellitus   . GERD (gastroesophageal reflux disease)   . Headache(784.0) 06/2003   s/p neuro eval ? migraine  . Hyperlipidemia   . Hyperplastic colon polyp 2008  . Hypertension   . Iron deficiency    h/o  . Ischemic cardiomyopathy    EF had 30-35%. The most recent EF was 50% on echo in september 2009  . PUD (peptic ulcer disease) 7/11   EGD showed few small ulcers, esophagues strecthed    Past Surgical History:  Procedure Laterality Date  . CATARACT EXTRACTION Right 07-2012  . CORONARY ARTERY BYPASS GRAFT  2004   LIMA to the LADs, sequentail SVG to intermediated and obtuse marginal, sequential SVG to PDA and posterolaterla  . INGUINAL HERNIA REPAIR Bilateral   . TONSILLECTOMY      Social History   Social History  . Marital status: Married    Spouse name: N/A  . Number of children: 0  . Years of education: N/A   Occupational History  . retired Freight forwarder     Social History Main Topics  . Smoking status: Former Smoker    Types: Cigarettes    Quit date: 12/16/1976  . Smokeless tobacco: Never Used  . Alcohol use No  . Drug use: No  . Sexual activity: Not on file   Other Topics Concern  . Not on file   Social History Narrative   Married , lives w/ wife , no  children            Allergies as of 08/08/2016      Reactions   Micardis [telmisartan]    REACTION: diarrhea      Medication List       Accurate as of 08/08/16  6:05 PM. Always use your most recent med list.          amLODipine 2.5 MG tablet Commonly known as:  NORVASC Take 1 tablet (2.5 mg total) by mouth daily.   aspirin 81 MG chewable tablet 2 daily.   CoQ10 400 MG Caps Take 1 capsule by mouth daily.   enalapril 10 MG tablet Commonly known as:  VASOTEC Take 1 tablet (10 mg total) by mouth 2 (two) times daily.   ferrous sulfate 325 (65 FE) MG tablet Take 325 mg by mouth daily with breakfast.   fish oil-omega-3 fatty acids 1000 MG capsule Take 1 g by mouth daily.   FLAX SEED OIL PO Take by mouth.   metoprolol tartrate 100 MG tablet Commonly known as:  LOPRESSOR Take 1 tablet (100 mg total) by mouth 2 (two) times daily.   omeprazole 40 MG capsule Commonly known as:  PRILOSEC Take 1 capsule (40 mg total) by mouth daily.   simvastatin 80 MG  tablet Commonly known as:  ZOCOR Take 1 tablet (80 mg total) by mouth at bedtime.   TYLENOL 8 HOUR PO Take 1 tablet by mouth as needed.          Objective:   Physical Exam BP 126/68 (BP Location: Left Arm, Patient Position: Sitting, Cuff Size: Normal)   Pulse (!) 58   Temp 98.2 F (36.8 C) (Oral)   Resp 14   Ht 5\' 8"  (1.727 m)   Wt 200 lb 4 oz (90.8 kg)   SpO2 96%   BMI 30.45 kg/m  General:   Well developed, well nourished . NAD.  HEENT:  Normocephalic . Face symmetric, atraumatic Neck: No TTP, range of motion is slightly decreased Lungs:  CTA B Normal respiratory effort, no intercostal retractions, no accessory muscle use. Heart: RRR,  no murmur.  No pretibial edema bilaterally  Skin: Not pale. Not jaundice Neurologic:  alert & oriented X3.  Speech normal, gait appropriate for age and unassisted Motor and DTRs symmetric. Face symmetric. Pupils equal, hyporeactive but again symmetric. Psych--    Cognition and judgment appear intact.  Cooperative with normal attention span and concentration.  Behavior appropriate. No anxious or depressed appearing.      Assessment & Plan:   Assessment  DM with neuropathy (Occasional numbness, normal pinprick exam 06-2015) HTN: rx lasix 01-2015, creat increased, d/c lasix Hyperlipidemia Anxiety, depression, insomnia Chronic fatigue Chronic renal insufficiency   CV:  --CAD -- carotid artery disease, US done 10-2014 Hypogonadism DJD   Thrombocytopenia History of a neck mass, evaluated by ENT remotely  PLAN: R Hand numbness, tightness at the R  triceps. The patient has multiple CV RF, last carotid ultrasound 12-2015, stable at 40-59% blockage. Sx could be vascular versus neurological (radiculopathy?). Neurological exam is nonfocal at this point.  Recommend to continue aspirin 162 mg daily, will refer to neurology to clarify sx etiology. ER if symptoms increase or severe

## 2016-09-05 ENCOUNTER — Other Ambulatory Visit: Payer: Self-pay | Admitting: Internal Medicine

## 2016-09-09 ENCOUNTER — Other Ambulatory Visit: Payer: Self-pay | Admitting: Internal Medicine

## 2016-09-10 MED ORDER — AMLODIPINE BESYLATE 2.5 MG PO TABS: 2.5000 mg | ORAL_TABLET | Freq: Every day | ORAL | 1 refills | Status: DC

## 2016-09-11 ENCOUNTER — Ambulatory Visit (INDEPENDENT_AMBULATORY_CARE_PROVIDER_SITE_OTHER): Payer: Medicare HMO | Admitting: Neurology

## 2016-09-11 ENCOUNTER — Telehealth: Payer: Self-pay | Admitting: Internal Medicine

## 2016-09-11 ENCOUNTER — Encounter: Payer: Self-pay | Admitting: Neurology

## 2016-09-11 VITALS — BP 150/82 | HR 76 | Resp 20 | Ht 68.0 in | Wt 202.5 lb

## 2016-09-11 DIAGNOSIS — M159 Polyosteoarthritis, unspecified: Secondary | ICD-10-CM

## 2016-09-11 DIAGNOSIS — R200 Anesthesia of skin: Secondary | ICD-10-CM | POA: Insufficient documentation

## 2016-09-11 DIAGNOSIS — I779 Disorder of arteries and arterioles, unspecified: Secondary | ICD-10-CM | POA: Diagnosis not present

## 2016-09-11 DIAGNOSIS — E785 Hyperlipidemia, unspecified: Secondary | ICD-10-CM | POA: Diagnosis not present

## 2016-09-11 DIAGNOSIS — R202 Paresthesia of skin: Secondary | ICD-10-CM | POA: Diagnosis not present

## 2016-09-11 DIAGNOSIS — I10 Essential (primary) hypertension: Secondary | ICD-10-CM | POA: Diagnosis not present

## 2016-09-11 DIAGNOSIS — M150 Primary generalized (osteo)arthritis: Secondary | ICD-10-CM | POA: Diagnosis not present

## 2016-09-11 DIAGNOSIS — I739 Peripheral vascular disease, unspecified: Secondary | ICD-10-CM

## 2016-09-11 DIAGNOSIS — M8949 Other hypertrophic osteoarthropathy, multiple sites: Secondary | ICD-10-CM

## 2016-09-11 MED ORDER — AMLODIPINE BESYLATE 2.5 MG PO TABS: 2.5000 mg | ORAL_TABLET | Freq: Every day | ORAL | 1 refills | Status: DC

## 2016-09-11 NOTE — Telephone Encounter (Signed)
Pt called in to follow up on refill request for amLODipine. Pt would like to have it sent to the mail order pharmacy. Pt says that he spoke with pharmacy a few minutes ago and it wasn't received.    [place], [place]
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
## 2016-09-11 NOTE — Telephone Encounter (Signed)
Rx resent.

## 2016-09-18 ENCOUNTER — Other Ambulatory Visit: Payer: Self-pay | Admitting: Neurology

## 2016-09-24 ENCOUNTER — Ambulatory Visit
Admission: RE | Admit: 2016-09-24 | Discharge: 2016-09-24 | Disposition: A | Discharge status: Home / Self Care | Payer: Medicare HMO | Source: Ambulatory Visit | Attending: Neurology | Admitting: Neurology

## 2016-09-24 DIAGNOSIS — R200 Anesthesia of skin: Secondary | ICD-10-CM

## 2016-09-24 DIAGNOSIS — R202 Paresthesia of skin: Secondary | ICD-10-CM | POA: Diagnosis not present

## 2016-09-27 ENCOUNTER — Encounter: Payer: Self-pay | Admitting: Neurology

## 2016-09-30 ENCOUNTER — Telehealth: Payer: Self-pay | Admitting: *Deleted

## 2016-09-30 ENCOUNTER — Encounter: Payer: Self-pay | Admitting: Neurology

## 2016-09-30 NOTE — Telephone Encounter (Signed)
Results given via My Chart/fim

## 2016-09-30 NOTE — Telephone Encounter (Signed)
-----   Message from Britt Bottom, MD sent at 09/30/2016  8:20 AM EDT ----- Please let him know the MRI does show that that the right C5 nerve root has much less room than it should as it leaves the spine due to degenerative changes. This could lead to pressure on the nerve and some pain in the right shoulder.  If he feels his pain or tingling worsens, we need to check a nerve conduction/EMG.

## 2016-10-01 ENCOUNTER — Encounter: Payer: Self-pay | Admitting: Internal Medicine

## 2016-10-01 ENCOUNTER — Telehealth: Payer: Self-pay | Admitting: Internal Medicine

## 2016-10-01 DIAGNOSIS — K4090 Unilateral inguinal hernia, without obstruction or gangrene, not specified as recurrent: Secondary | ICD-10-CM | POA: Diagnosis not present

## 2016-10-01 NOTE — Telephone Encounter (Signed)
Please see patient's message, please triage him, if indicated send him to the ED otherwise seems like he needs a referral to see a Psychologist, sport and exercise.

## 2016-10-01 NOTE — Telephone Encounter (Signed)
Called to follow up with patient.  Pt states he was seen at Urgent Care earlier today and was advised to follow up with urologist.  10 days ago pt picked up something wrong and started experiencing pain in groin.  Pt was so severe he decided to be seen today at [facility].  Pt is stable now, rated pain 7/10.  Pain worsens with prolonged walking and standing. Taking tylenol, but not helping much.  UC did not prescribe anything new for pain.  Denied chest pain, shortness of breath, nausea, vomiting, fever, severe/untolerable pain,  inability to pass gas.  Pt states he feels normal, just has the pain.  He said he called a little while ago and got an appt to see Dr. Larose Kells tomorrow at 9:30 am.   Spoke with Dr. Larose Kells.  He advised surgery referral.  If symptoms worsen, to go ER.    Spoke with patient and advised him the same.  Pt states he was informed it was a small hernia, not bulging and he would prefer to go to a urologist first. He's had hernia repair before in the past, and he doesn't think he's at that point yet.    Spoke with Dr. Larose Kells.  He advised that patient keep appointment with him tomorrow as scheduled.   Called patient.  He agreed to keep appt as scheduled.

## 2016-10-01 NOTE — Telephone Encounter (Signed)
Noted, thank you

## 2016-10-02 ENCOUNTER — Encounter: Payer: Self-pay | Admitting: Internal Medicine

## 2016-10-02 ENCOUNTER — Ambulatory Visit (INDEPENDENT_AMBULATORY_CARE_PROVIDER_SITE_OTHER): Payer: Medicare HMO | Admitting: Internal Medicine

## 2016-10-02 VITALS — BP 130/78 | HR 59 | Temp 97.5°F | Resp 14 | Ht 68.0 in | Wt 200.4 lb

## 2016-10-02 DIAGNOSIS — R1031 Right lower quadrant pain: Secondary | ICD-10-CM

## 2016-10-02 NOTE — Progress Notes (Signed)
Subjective:    Patient ID: [name] Little, male    DOB: 1941-02-29, 76 y.o.   MRN: [id number]  DOS:  10/02/2016 Type of visit - description : Acute visit Interval history:  About 10 days ago did some heavy lifting, the next day developed pain at the right groin, actually pain as the most proximal inner aspect of the R thigh. Did not notice any swelling or bulging. Pain was on and off, at times severe. Worse when he walks in concrete. Went to urgent care and after a examination was recommended to go to a urologist. I asked the patient to come by and let me examine him.   Review of Systems No fever chills No nausea, vomiting, constipation. No scrotal swelling or testicular pain.  Past Medical History:  Diagnosis Date  . Allergic rhinitis   . Anxiety and depression   . Arthritis   . CAD (coronary artery disease)   . Diabetes mellitus   . GERD (gastroesophageal reflux disease)   . Headache(784.0) 06/2003   s/p neuro eval ? migraine  . Hyperlipidemia   . Hyperplastic colon polyp 2008  . Hypertension   . Iron deficiency    h/o  . Ischemic cardiomyopathy    EF had 30-35%. The most recent EF was 50% on echo in september 2009  . PUD (peptic ulcer disease) 7/11   EGD showed few small ulcers, esophagues strecthed  . Vision abnormalities     Past Surgical History:  Procedure Laterality Date  . CATARACT EXTRACTION Right 07-2012  . CORONARY ARTERY BYPASS GRAFT  2004   LIMA to the LADs, sequentail SVG to intermediated and obtuse marginal, sequential SVG to PDA and posterolaterla  . INGUINAL HERNIA REPAIR Bilateral   . TONSILLECTOMY      Social History   Social History  . Marital status: Married    Spouse name: N/A  . Number of children: 0  . Years of education: N/A   Occupational History  . retired Freight forwarder     Social History Main Topics  . Smoking status: Former Smoker    Types: Cigarettes    Quit date: 12/16/1976  . Smokeless tobacco: Never Used  . Alcohol use  No  . Drug use: No  . Sexual activity: Not on file   Other Topics Concern  . Not on file   Social History Narrative   Married , lives w/ wife , no children            Allergies as of 10/02/2016      Reactions   Micardis [telmisartan]    REACTION: diarrhea      Medication List       Accurate as of 10/02/16  9:55 PM. Always use your most recent med list.          amLODipine 2.5 MG tablet Commonly known as:  NORVASC Take 1 tablet (2.5 mg total) by mouth daily.   aspirin 81 MG chewable tablet 2 daily.   CoQ10 400 MG Caps Take 1 capsule by mouth daily.   enalapril 10 MG tablet Commonly known as:  VASOTEC Take 1 tablet (10 mg total) by mouth 2 (two) times daily.   ferrous sulfate 325 (65 FE) MG tablet Take 325 mg by mouth daily with breakfast.   fish oil-omega-3 fatty acids 1000 MG capsule Take 1 g by mouth daily.   FLAX SEED OIL PO Take by mouth.   metoprolol tartrate 100 MG tablet Commonly known as:  LOPRESSOR Take  1 tablet (100 mg total) by mouth 2 (two) times daily.   omeprazole 40 MG capsule Commonly known as:  PRILOSEC Take 1 capsule (40 mg total) by mouth daily.   simvastatin 80 MG tablet Commonly known as:  ZOCOR Take 1 tablet (80 mg total) by mouth at bedtime.   TYLENOL 8 HOUR PO Take 1 tablet by mouth as needed.          Objective:   Physical Exam BP 130/78 (BP Location: Left Arm, Patient Position: Sitting, Cuff Size: Normal)   Pulse (!) 59   Temp (!) 97.5 F (36.4 C) (Oral)   Resp 14   Ht 5\' 8"  (1.727 m)   Wt 200 lb 6 oz (90.9 kg)   SpO2 97%   BMI 30.47 kg/m  General:   Well developed, well nourished . NAD.  HEENT:  Normocephalic . Face symmetric, atraumatic  Abdomen:  Not distended, soft, non-tender. No rebound or rigidity.  No mass, lump or hernias on either side of the groin and suprapubic area. GU: Penis normal, scrotum with normal contents. MSK: Rotation of right hip normal Skin: Not pale. Not jaundice Neurologic:    alert & oriented X3.  Speech normal, gait appropriate for age and unassisted Psych--  Cognition and judgment appear intact.  Cooperative with normal attention span and concentration.  Behavior appropriate. No anxious or depressed appearing.    Assessment & Plan:   Assessment  DM with neuropathy (Occasional numbness, normal pinprick exam 06-2015) HTN: rx lasix 01-2015, creat increased, d/c lasix Hyperlipidemia Anxiety, depression, insomnia Chronic fatigue Chronic renal insufficiency   CV:  --CAD -- carotid artery disease, US done 10-2014 Hypogonadism DJD   Thrombocytopenia History of a neck mass, evaluated by ENT remotely  PLAN: Pain, right groin: No evidence of a hernia at this point. I wonder if he  has an abductor strain. We agreed on observation if symptoms persist more than 10 days or if he has severe symptoms he will let me know. Tylenol as needed. See instructions.

## 2016-10-02 NOTE — Progress Notes (Signed)
Pre visit review using our clinic review tool, if applicable. No additional management support is needed unless otherwise documented below in the visit note. 

## 2016-10-02 NOTE — Patient Instructions (Signed)
Tylenol  500 mg OTC 2 tabs a day every 8 hours as needed for pain  Call if the pain persists for the next 10 days to 2 weeks.  Also call if you have severe symptoms, fever, chills, swelling at the groin or testicles. Nausea or vomiting.

## 2016-10-02 NOTE — Assessment & Plan Note (Signed)
Pain, right groin: No evidence of a hernia at this point. I wonder if he  has an abductor strain. We agreed on observation if symptoms persist more than 10 days or if he has severe symptoms he will let me know. Tylenol as needed. See instructions.

## 2016-10-03 ENCOUNTER — Encounter: Payer: Self-pay | Admitting: Cardiology

## 2016-10-07 ENCOUNTER — Telehealth: Payer: Self-pay | Admitting: Internal Medicine

## 2016-10-07 ENCOUNTER — Encounter: Payer: Self-pay | Admitting: Internal Medicine

## 2016-10-07 MED ORDER — HYDROCODONE-ACETAMINOPHEN 5-325 MG PO TABS: 1.0000 | ORAL_TABLET | Freq: Three times a day (TID) | ORAL | 0 refills | Status: DC | PRN

## 2016-10-07 NOTE — Telephone Encounter (Signed)
Patient sent to message, still having pain at the groin, felt to be MSK. Advise patient if he has other problems (fever, chills, rash, a bulging area) he needs to be seen otherwise we can send a prescription for hydrocodone. Will cause drowsiness. Prescription is ready to be send if the patient agrees

## 2016-10-07 NOTE — Telephone Encounter (Signed)
Spouse checking on the status of my chart message, wife states patient is in pain requesting RX, please advise spouse via mobile as per her request [phone]

## 2016-10-07 NOTE — Telephone Encounter (Signed)
Spoke w/ Pt, informed of recommendations and informed that Rx has been placed at front desk. Pt will have his wife come by office to get prescription.

## 2016-10-07 NOTE — Telephone Encounter (Signed)
Rx printed, awaiting MD signature.  

## 2016-10-08 ENCOUNTER — Ambulatory Visit: Payer: Self-pay | Admitting: Cardiology

## 2016-10-08 ENCOUNTER — Telehealth: Payer: Self-pay | Admitting: Internal Medicine

## 2016-10-08 DIAGNOSIS — S76211D Strain of adductor muscle, fascia and tendon of right thigh, subsequent encounter: Secondary | ICD-10-CM

## 2016-10-08 NOTE — Telephone Encounter (Signed)
Arrange a referral to orthopedic surgery. DX strain, leg.

## 2016-10-08 NOTE — Telephone Encounter (Signed)
Please advise 

## 2016-10-08 NOTE — Telephone Encounter (Signed)
°  Relation to [id number] Call back number:[phone]   Reason for call:  Patient states medication prescribed for groin pain is not working and he would like a referral to specialist as soon as possible due to groin discomfort,please advise

## 2016-10-09 NOTE — Telephone Encounter (Signed)
Referral placed.

## 2016-10-10 ENCOUNTER — Ambulatory Visit (INDEPENDENT_AMBULATORY_CARE_PROVIDER_SITE_OTHER): Payer: Medicare HMO

## 2016-10-10 ENCOUNTER — Ambulatory Visit (INDEPENDENT_AMBULATORY_CARE_PROVIDER_SITE_OTHER): Payer: Medicare HMO | Admitting: Orthopedic Surgery

## 2016-10-10 ENCOUNTER — Encounter (INDEPENDENT_AMBULATORY_CARE_PROVIDER_SITE_OTHER): Payer: Self-pay | Admitting: Orthopedic Surgery

## 2016-10-10 DIAGNOSIS — S338XXA Sprain of other parts of lumbar spine and pelvis, initial encounter: Secondary | ICD-10-CM

## 2016-10-10 DIAGNOSIS — M25551 Pain in right hip: Secondary | ICD-10-CM | POA: Diagnosis not present

## 2016-10-10 NOTE — Progress Notes (Signed)
Office Visit Note   Patient: [name] Little           Date of Birth: 20-Nov-1940           MRN: [id number] Visit Date: 10/10/2016              Requested by: Colon Branch, [address] [place], [place] [address] PCP: Colon Branch, MD  Chief Complaint  Patient presents with  . Right Hip - Pain      HPI: Patient is a 76 year old gentleman who was seen for initial evaluation for right groin pain. Patient was initially evaluated for possible hernia. He has had 2 hernias in the past but the workup was negative. Patient states he heard a pop right groin after picking up some tree limbs. He states the Vicodin has not helped. Patient states the pain is worse with sitting and startup. Patient states that he yells when he tries to ambulate he currently uses a cane in the left hand.  Assessment & Plan: Visit Diagnoses:  1. Pain in right hip   2. Sprain of groin, initial encounter     Plan: Recommended Aleve 1 by mouth twice a day he is currently taking an aspirin a day. Discussed that the sprain of the sella last should resolve with time if he is not better follow-up as needed.  Follow-Up Instructions: Return if symptoms worsen or fail to improve.   Ortho Exam  Patient is alert, oriented, no adenopathy, well-dressed, normal affect, normal respiratory effort. On examination patient has an antalgic gait using a cane in the left hand. He has no pain with passive range of motion of the right hip has no pain with weightbearing at extreme ranges of motion he does have pain in the right hip. He has pain reproduced with hip flexion. He has venous stasis changes in his leg no sciatic tension signs good motor strength in the right lower extremity.  Imaging: Xr Hip Unilat W Or W/o Pelvis 2-3 Views Right  Result Date: 10/10/2016 2 view radiographs of the right hip shows no evidence of fracture of the pelvis or the right hip. Patient does have some vascular clamps in place from his  vein harvesting for coronary artery bypass surgery.   Labs: Lab Results  Component Value Date   HGBA1C 6.4 07/17/2016   HGBA1C 6.6 (H) 11/15/2015   HGBA1C 6.4 06/21/2015   ESRSEDRATE 8 04/20/2014    Orders:  Orders Placed This Encounter  Procedures  . XR HIP UNILAT W OR W/O PELVIS 2-3 VIEWS RIGHT   No orders of the defined types were placed in this encounter.    Procedures: No procedures performed  Clinical Data: No additional findings.  ROS:  All other systems negative, except as noted in the HPI. Review of Systems  Objective: Vital Signs: There were no vitals taken for this visit.  Specialty Comments:  No specialty comments available.  PMFS History: Patient Active Problem List   Diagnosis Date Noted  . Right arm numbness 09/11/2016  . PCP NOTES >>>>>>>>>>>>>>>>>>>>>> 11/02/2014  . Fatigue-- sleepy 04/20/2014  . Eye fatigue 12/07/2013  . Carotid arterial disease ([place]) 10/18/2013  . Sinusitis, chronic  (ill defined L face pressure) 02/05/2012  . Annual physical exam 08/21/2010  . RENAL INSUFFICIENCY, CHRONIC 04/26/2010  . ALLERGIC RHINITIS 04/23/2010  . NECK MASS 07/25/2009  . Diabetes mellitus with neuropathy ([place]) 11/15/2008  . THROMBOCYTOPENIA 06/06/2008  . HYPOGONADISM, MALE 02/04/2008  .  CAD 06/01/2007  . DJD (degenerative joint disease) 06/01/2007  . Hyperlipidemia 06/24/2006  . Anxiety-depression- insomnia 06/24/2006  . Essential hypertension 06/24/2006  . GERD 06/24/2006  . HEADACHE 06/24/2006   Past Medical History:  Diagnosis Date  . Allergic rhinitis   . Anxiety and depression   . Arthritis   . CAD (coronary artery disease)   . Diabetes mellitus   . GERD (gastroesophageal reflux disease)   . Headache(784.0) 06/2003   s/p neuro eval ? migraine  . Hyperlipidemia   . Hyperplastic colon polyp 2008  . Hypertension   . Iron deficiency    h/o  . Ischemic cardiomyopathy    EF had 30-35%. The most recent EF was 50% on echo in september  2009  . PUD (peptic ulcer disease) 7/11   EGD showed few small ulcers, esophagues strecthed  . Vision abnormalities     Family History  Problem Relation Age of Onset  . Diabetes Father   . Heart attack Father   . Heart attack Mother   . Cancer Paternal Grandfather        type unknown  . Colon cancer Neg Hx   . Prostate cancer Neg Hx     Past Surgical History:  Procedure Laterality Date  . CATARACT EXTRACTION Right 07-2012  . CORONARY ARTERY BYPASS GRAFT  2004   LIMA to the LADs, sequentail SVG to intermediated and obtuse marginal, sequential SVG to PDA and posterolaterla  . INGUINAL HERNIA REPAIR Bilateral   . TONSILLECTOMY     Social History   Occupational History  . retired Freight forwarder     Social History Main Topics  . Smoking status: Former Smoker    Types: Cigarettes    Quit date: 12/16/1976  . Smokeless tobacco: Never Used  . Alcohol use No  . Drug use: No  . Sexual activity: Not on file

## 2016-10-11 ENCOUNTER — Telehealth: Payer: Self-pay | Admitting: Internal Medicine

## 2016-10-11 NOTE — Telephone Encounter (Signed)
Pt called in because he says that the pain isn't getting any better and the pain medication Hydrocodone doesn't help a lot. Pt would like to know if provider could give him something else?    CB: [phone]

## 2016-10-11 NOTE — Telephone Encounter (Signed)
We'll have to stick with Tylenol or hydrocodone. I recommend not to use NSAIDs

## 2016-10-11 NOTE — Telephone Encounter (Signed)
Pt seen by Dr. Sharol Given at [place] yesterday, please advise?

## 2016-10-11 NOTE — Telephone Encounter (Signed)
LMOM informing Pt of recommendations.  °

## 2016-10-17 NOTE — Telephone Encounter (Signed)
thx

## 2016-10-17 NOTE — Telephone Encounter (Signed)
Spoke w/ Pt, he called informing that he did not receive message left on 10/11/2016- informed again of recommendations per PCP. Also recommended if still in severe pain would recommend he come back in for follow-up w/ PCP or w/ ortho Dr. Sharol Given. Pt stated he wouldn't consider Dr. Sharol Given a doctor because he didn't do anything to help him and stated he would just wait it out.

## 2016-10-22 ENCOUNTER — Encounter: Payer: Self-pay | Admitting: Internal Medicine

## 2016-11-15 ENCOUNTER — Encounter: Payer: Self-pay | Admitting: Gastroenterology

## 2016-11-20 NOTE — Progress Notes (Signed)
HPI The patient returns for one year follow up of CAD.  Since I last saw him he has done well.  He has not had any cardiac complaints.  However, he did "pull a groin muscle".  He said this was very painful.  He has not been able to walk with this although he did do some yard work yesterday.  The patient denies any new symptoms such as chest discomfort, neck or arm discomfort. There has been no new shortness of breath, PND or orthopnea. There have been no reported palpitations, presyncope or syncope.   Allergies  Allergen Reactions  . Micardis [Telmisartan]     REACTION: diarrhea    Current Outpatient Prescriptions  Medication Sig Dispense Refill  . Acetaminophen (TYLENOL 8 HOUR PO) Take 1 tablet by mouth as needed.    Marland Kitchen amLODipine (NORVASC) 2.5 MG tablet Take 1 tablet (2.5 mg total) by mouth daily. 90 tablet 1  . aspirin 81 MG chewable tablet 2 daily.     . Coenzyme Q10 (COQ10) 400 MG CAPS Take 1 capsule by mouth daily.    . enalapril (VASOTEC) 10 MG tablet Take 1 tablet (10 mg total) by mouth 2 (two) times daily. 180 tablet 2  . ferrous sulfate 325 (65 FE) MG tablet Take 325 mg by mouth daily with breakfast.    . fish oil-omega-3 fatty acids 1000 MG capsule Take 1 g by mouth daily.     . Flaxseed, Linseed, (FLAX SEED OIL PO) Take by mouth.     . metoprolol tartrate (LOPRESSOR) 100 MG tablet Take 1 tablet (100 mg total) by mouth 2 (two) times daily. 180 tablet 1  . omeprazole (PRILOSEC) 40 MG capsule Take 1 capsule (40 mg total) by mouth daily. 90 capsule 1  . simvastatin (ZOCOR) 80 MG tablet Take 1 tablet (80 mg total) by mouth at bedtime. 90 tablet 1   No current facility-administered medications for this visit.     Past Medical History:  Diagnosis Date  . Allergic rhinitis   . Anxiety and depression   . Arthritis   . CAD (coronary artery disease)   . Diabetes mellitus   . GERD (gastroesophageal reflux disease)   . Headache(784.0) 06/2003   s/p neuro eval ? migraine  .  Hyperlipidemia   . Hyperplastic colon polyp 2008  . Hypertension   . Iron deficiency    h/o  . Ischemic cardiomyopathy    EF had 30-35%. The most recent EF was 50% on echo in september 2009  . PUD (peptic ulcer disease) 7/11   EGD showed few small ulcers, esophagues strecthed  . Vision abnormalities     Past Surgical History:  Procedure Laterality Date  . CATARACT EXTRACTION Right 07-2012  . CORONARY ARTERY BYPASS GRAFT  2004   LIMA to the LADs, sequentail SVG to intermediated and obtuse marginal, sequential SVG to PDA and posterolaterla  . INGUINAL HERNIA REPAIR Bilateral   . TONSILLECTOMY      ROS: As stated in the HPI and negative for all other systems.  PHYSICAL EXAM BP (!) 144/70   Pulse 62   Ht 5\' 8"  (1.727 m)   Wt 196 lb (88.9 kg)   BMI 29.80 kg/m   GENERAL:  Well appearing NECK:  No jugular venous distention, waveform within normal limits, carotid upstroke brisk and symmetric, no bruits, no thyromegaly LUNGS:  Clear to auscultation bilaterally CHEST: Well healed sternotomy scar. HEART:  PMI not displaced or sustained,S1 and S2 within normal limits, no  S3, no S4, no clicks, no rubs, no murmurs ABD:  Flat, positive bowel sounds normal in frequency in pitch, no bruits, no rebound, no guarding, no midline pulsatile mass, no hepatomegaly, no splenomegaly EXT:  2 plus pulses throughout, trace edema, no cyanosis no clubbing    EKG:  Sinus rhythm, rate 62, axis within normal limits, old inferior infarct, old anteroseptal infarct, no acute ST-T wave changes  Lab Results  Component Value Date   CHOL 115 11/15/2015   TRIG 134.0 11/15/2015   HDL 26.90 (L) 11/15/2015   LDLCALC 62 11/15/2015     ASSESSMENT AND PLAN  CAD -  The patient has no new sypmtoms.  No further cardiovascular testing is indicated.  We will continue with aggressive risk reduction and meds as listed.  CARDIOMYOPATHY - He had a mildy reduced EF in 2016.  He seems to be euvolemic.  No change in  therapy or further testing is indicated at this point.   HYPERTENSION -  The blood pressure is at target. No change in medications is indicated. We will continue with therapeutic lifestyle changes (TLC).  HYPERLIPIDEMIA -  He will get a lipid profile in Oct and I will be happy to review this.    CAROTID STENOSIS - He is carotid has 40 - 59% bilateral stenosis in 2017 in Oct.  I will repeat this next month.

## 2016-11-21 ENCOUNTER — Ambulatory Visit (INDEPENDENT_AMBULATORY_CARE_PROVIDER_SITE_OTHER): Payer: Medicare HMO | Admitting: Cardiology

## 2016-11-21 ENCOUNTER — Encounter: Payer: Self-pay | Admitting: Cardiology

## 2016-11-21 VITALS — BP 144/70 | HR 62 | Ht 68.0 in | Wt 196.0 lb

## 2016-11-21 DIAGNOSIS — I739 Peripheral vascular disease, unspecified: Secondary | ICD-10-CM

## 2016-11-21 DIAGNOSIS — I2510 Atherosclerotic heart disease of native coronary artery without angina pectoris: Secondary | ICD-10-CM

## 2016-11-21 DIAGNOSIS — I779 Disorder of arteries and arterioles, unspecified: Secondary | ICD-10-CM

## 2016-11-21 DIAGNOSIS — I10 Essential (primary) hypertension: Secondary | ICD-10-CM | POA: Diagnosis not present

## 2016-11-21 DIAGNOSIS — I6529 Occlusion and stenosis of unspecified carotid artery: Secondary | ICD-10-CM | POA: Diagnosis not present

## 2016-11-21 NOTE — Patient Instructions (Signed)
Medication Instructions:  Continue current medications  If you need a refill on your cardiac medications before your next appointment, please call your pharmacy.  Labwork: None Ordered   Testing/Procedures: Your physician has requested that you have a carotid duplex. This test is an ultrasound of the carotid arteries in your neck. It looks at blood flow through these arteries that supply the brain with blood. Allow one hour for this exam. There are no restrictions or special instructions.  Follow-Up: Your physician wants you to follow-up in: 1 Year. You should receive a reminder letter in the mail two months in advance. If you do not receive a letter, please call our office [phone].    Thank you for choosing [facility]!!      

## 2016-11-25 ENCOUNTER — Other Ambulatory Visit: Payer: Self-pay | Admitting: Cardiology

## 2016-11-26 NOTE — Telephone Encounter (Signed)
Rx(s) sent to pharmacy electronically.  

## 2016-12-03 ENCOUNTER — Other Ambulatory Visit: Payer: Self-pay | Admitting: Cardiology

## 2016-12-03 DIAGNOSIS — I779 Disorder of arteries and arterioles, unspecified: Secondary | ICD-10-CM

## 2016-12-03 DIAGNOSIS — I6529 Occlusion and stenosis of unspecified carotid artery: Secondary | ICD-10-CM

## 2016-12-03 DIAGNOSIS — I739 Peripheral vascular disease, unspecified: Secondary | ICD-10-CM

## 2016-12-05 DIAGNOSIS — R69 Illness, unspecified: Secondary | ICD-10-CM | POA: Diagnosis not present

## 2016-12-18 ENCOUNTER — Ambulatory Visit (HOSPITAL_COMMUNITY)
Admission: RE | Admit: 2016-12-18 | Discharge: 2016-12-18 | Disposition: A | Discharge status: Home / Self Care | Payer: Medicare HMO | Source: Ambulatory Visit | Attending: Cardiovascular Disease | Admitting: Cardiovascular Disease

## 2016-12-18 DIAGNOSIS — I6523 Occlusion and stenosis of bilateral carotid arteries: Secondary | ICD-10-CM | POA: Diagnosis not present

## 2016-12-18 DIAGNOSIS — Z87891 Personal history of nicotine dependence: Secondary | ICD-10-CM | POA: Insufficient documentation

## 2016-12-18 DIAGNOSIS — I10 Essential (primary) hypertension: Secondary | ICD-10-CM | POA: Diagnosis not present

## 2016-12-18 DIAGNOSIS — E119 Type 2 diabetes mellitus without complications: Secondary | ICD-10-CM | POA: Insufficient documentation

## 2016-12-18 DIAGNOSIS — I6529 Occlusion and stenosis of unspecified carotid artery: Secondary | ICD-10-CM

## 2016-12-18 DIAGNOSIS — I779 Disorder of arteries and arterioles, unspecified: Secondary | ICD-10-CM | POA: Diagnosis not present

## 2016-12-18 DIAGNOSIS — I2510 Atherosclerotic heart disease of native coronary artery without angina pectoris: Secondary | ICD-10-CM | POA: Diagnosis not present

## 2016-12-18 DIAGNOSIS — E785 Hyperlipidemia, unspecified: Secondary | ICD-10-CM | POA: Diagnosis not present

## 2016-12-18 DIAGNOSIS — I739 Peripheral vascular disease, unspecified: Secondary | ICD-10-CM

## 2016-12-26 ENCOUNTER — Other Ambulatory Visit: Payer: Self-pay | Admitting: *Deleted

## 2016-12-26 DIAGNOSIS — I6523 Occlusion and stenosis of bilateral carotid arteries: Secondary | ICD-10-CM

## 2017-01-16 ENCOUNTER — Ambulatory Visit (INDEPENDENT_AMBULATORY_CARE_PROVIDER_SITE_OTHER): Payer: Medicare HMO | Admitting: Internal Medicine

## 2017-01-16 ENCOUNTER — Encounter: Payer: Self-pay | Admitting: Internal Medicine

## 2017-01-16 VITALS — BP 134/72 | HR 46 | Temp 98.3°F | Resp 14 | Ht 68.0 in | Wt 203.2 lb

## 2017-01-16 DIAGNOSIS — H9193 Unspecified hearing loss, bilateral: Secondary | ICD-10-CM | POA: Diagnosis not present

## 2017-01-16 DIAGNOSIS — Z0000 Encounter for general adult medical examination without abnormal findings: Secondary | ICD-10-CM

## 2017-01-16 LAB — CBC WITH DIFFERENTIAL/PLATELET
BASOS PCT: 1.2 % (ref 0.0–3.0)
Basophils Absolute: 0.1 10*3/uL (ref 0.0–0.1)
EOS ABS: 0.9 10*3/uL — AB (ref 0.0–0.7)
Eosinophils Relative: 13.8 % — ABNORMAL HIGH (ref 0.0–5.0)
HEMATOCRIT: 51.5 % (ref 39.0–52.0)
HEMOGLOBIN: 17.3 g/dL — AB (ref 13.0–17.0)
LYMPHS PCT: 26.9 % (ref 12.0–46.0)
Lymphs Abs: 1.7 10*3/uL (ref 0.7–4.0)
MCHC: 33.6 g/dL (ref 30.0–36.0)
MCV: 100.6 fl — ABNORMAL HIGH (ref 78.0–100.0)
Monocytes Absolute: 0.6 10*3/uL (ref 0.1–1.0)
Monocytes Relative: 9.3 % (ref 3.0–12.0)
Neutro Abs: 3.1 10*3/uL (ref 1.4–7.7)
Neutrophils Relative %: 48.8 % (ref 43.0–77.0)
Platelets: 123 10*3/uL — ABNORMAL LOW (ref 150.0–400.0)
RBC: 5.12 Mil/uL (ref 4.22–5.81)
RDW: 13.2 % (ref 11.5–15.5)
WBC: 6.3 10*3/uL (ref 4.0–10.5)

## 2017-01-16 LAB — COMPREHENSIVE METABOLIC PANEL
ALT: 22 U/L (ref 0–53)
AST: 18 U/L (ref 0–37)
Albumin: 4 g/dL (ref 3.5–5.2)
Alkaline Phosphatase: 54 U/L (ref 39–117)
BILIRUBIN TOTAL: 0.8 mg/dL (ref 0.2–1.2)
BUN: 18 mg/dL (ref 6–23)
CALCIUM: 9.5 mg/dL (ref 8.4–10.5)
CO2: 33 meq/L — AB (ref 19–32)
Chloride: 100 mEq/L (ref 96–112)
Creatinine, Ser: 1.31 mg/dL (ref 0.40–1.50)
GFR: 56.37 mL/min — AB (ref >60.00)
Glucose, Bld: 128 mg/dL — ABNORMAL HIGH (ref 70–99)
Potassium: 5.2 mEq/L — ABNORMAL HIGH (ref 3.5–5.1)
Sodium: 141 mEq/L (ref 135–145)
Total Protein: 6.5 g/dL (ref 6.0–8.3)

## 2017-01-16 LAB — LIPID PANEL
Cholesterol: 149 mg/dL (ref 0–200)
HDL: 29.8 mg/dL — AB (ref >39.00)
LDL Cholesterol: 90 mg/dL (ref 0–99)
NonHDL: 119.51
TRIGLYCERIDES: 150 mg/dL — AB (ref 0.0–149.0)
Total CHOL/HDL Ratio: 5
VLDL: 30 mg/dL (ref 0.0–40.0)

## 2017-01-16 LAB — HEMOGLOBIN A1C: Hgb A1c MFr Bld: 6.1 % (ref 4.6–6.5)

## 2017-01-16 NOTE — Progress Notes (Signed)
Subjective:    Patient ID: [name] Little, male    DOB: 1940-07-25, 76 y.o.   MRN: [id number]  DOS:  01/16/2017 Type of visit - description : cpx Interval history: No major concerns although a couple of days ago he felt a lump on the right side of the neck, it is  slightly tender. Reports no pain on swelling at the gum.  Did have a sore throat 2 weeks ago. Pulse is a slightly low,  he is asymptomatic with no dizziness or orthostatic symptoms. HOH: Refer to audiology?   Review of Systems   Other than above, a 14 point review of systems is negative    Past Medical History:  Diagnosis Date  . Allergic rhinitis   . Anxiety and depression   . Arthritis   . CAD (coronary artery disease)   . Diabetes mellitus   . GERD (gastroesophageal reflux disease)   . Headache(784.0) 06/2003   s/p neuro eval ? migraine  . Hyperlipidemia   . Hyperplastic colon polyp 2008  . Hypertension   . Iron deficiency    h/o  . Ischemic cardiomyopathy    EF had 30-35%. The most recent EF was 50% on echo in september 2009  . PUD (peptic ulcer disease) 7/11   EGD showed few small ulcers, esophagues strecthed  . Vision abnormalities     Past Surgical History:  Procedure Laterality Date  . CATARACT EXTRACTION Right 07-2012  . CORONARY ARTERY BYPASS GRAFT  2004   LIMA to the LADs, sequentail SVG to intermediated and obtuse marginal, sequential SVG to PDA and posterolaterla  . INGUINAL HERNIA REPAIR Bilateral   . TONSILLECTOMY      Social History   Socioeconomic History  . Marital status: Married    Spouse name: Not on file  . Number of children: 0  . Years of education: Not on file  . Highest education level: Not on file  Social Needs  . Financial resource strain: Not on file  . Food insecurity - worry: Not on file  . Food insecurity - inability: Not on file  . Transportation needs - medical: Not on file  . Transportation needs - non-medical: Not on file  Occupational History  .  Occupation: retired Freight forwarder   Tobacco Use  . Smoking status: Former Smoker    Types: Cigarettes    Last attempt to quit: 12/16/1976    Years since quitting: 40.1  . Smokeless tobacco: Never Used  Substance and Sexual Activity  . Alcohol use: No    Alcohol/week: 0.0 oz  . Drug use: No  . Sexual activity: Not on file  Other Topics Concern  . Not on file  Social History Narrative   Married , lives w/ wife , no children           Family History  Problem Relation Age of Onset  . Diabetes Father   . Heart attack Father   . Heart attack Mother   . Cancer Paternal Grandfather        type unknown  . Colon cancer Neg Hx   . Prostate cancer Neg Hx      Allergies as of 01/16/2017      Reactions   Micardis [telmisartan]    REACTION: diarrhea      Medication List        Accurate as of 01/16/17  6:29 PM. Always use your most recent med list.          amLODipine 2.5  MG tablet Commonly known as:  NORVASC Take 1 tablet (2.5 mg total) by mouth daily.   aspirin 81 MG chewable tablet 2 daily.   CoQ10 400 MG Caps Take 1 capsule by mouth daily.   enalapril 10 MG tablet Commonly known as:  VASOTEC TAKE 1 TABLET TWICE A DAY   ferrous sulfate 325 (65 FE) MG tablet Take 325 mg by mouth daily with breakfast.   fish oil-omega-3 fatty acids 1000 MG capsule Take 1 g by mouth daily.   FLAX SEED OIL PO Take by mouth.   metoprolol tartrate 100 MG tablet Commonly known as:  LOPRESSOR Take 1 tablet (100 mg total) by mouth 2 (two) times daily.   omeprazole 40 MG capsule Commonly known as:  PRILOSEC Take 1 capsule (40 mg total) by mouth daily.   simvastatin 80 MG tablet Commonly known as:  ZOCOR Take 1 tablet (80 mg total) by mouth at bedtime.   TYLENOL 8 HOUR PO Take 1 tablet by mouth as needed.          Objective:   Physical Exam  Neck:     BP 134/72 (BP Location: Left Arm, Patient Position: Sitting, Cuff Size: Small)   Pulse (!) 46   Temp 98.3 F (36.8 C)  (Oral)   Resp 14   Ht 5\' 8"  (1.727 m)   Wt 203 lb 4 oz (92.2 kg)   SpO2 96%   BMI 30.90 kg/m  General:   Well developed, well nourished . NAD.  Neck: No  thyromegaly  HEENT:  Normocephalic . Face symmetric, atraumatic.  The patient remove his denture, gum with no lesions, redness or swelling. Lungs:  CTA B Normal respiratory effort, no intercostal retractions, no accessory muscle use. Heart: RRR,  no murmur.  No pretibial edema bilaterally  Abdomen:  Not distended, soft, non-tender. No rebound or rigidity.   Skin: Exposed areas without rash. Not pale. Not jaundice Neurologic:  alert & oriented X3.  Speech normal, gait appropriate for age and unassisted Strength symmetric and appropriate for age.  Psych: Cognition and judgment appear intact.  Cooperative with normal attention span and concentration.  Behavior appropriate. No anxious or depressed appearing.     Assessment & Plan:   Assessment  DM with neuropathy (Occasional numbness, normal pinprick exam 06-2015) HTN: rx lasix 01-2015, creat increased, d/c lasix Hyperlipidemia Anxiety, depression, insomnia Chronic fatigue Chronic renal insufficiency   CV:  --CAD -- carotid artery disease, US done 10-2014 Hypogonadism DJD   Hematology:  -Iron deficiency: saw GI 02/2016, declined a GI workup. -Thrombocytopenia History of a neck mass, evaluated by ENT remotely  PLAN: DM: Diet controlled check A1c. HTN well-controlled on amlodipine, Vasotec, metoprolol.   Hyperlipidemia: On simvastatin, checking labs CAD, cardiomyopathy, carotid stenosis: Saw cardiology 11/21/2016, felt to be stable, had a repeated carotid US Neck mass: Right submandibular area, on chart review that has been present for years.  Nevertheless the patient is worried, recommend to come back in two months for a recheck. Iron deficiency: Checking a CBC.  Declined a colonoscopy again today. HOH: request audiology referral RTC 2 months

## 2017-01-16 NOTE — Assessment & Plan Note (Addendum)
-  Td 2014 ; pneumonia shot  2013 ; prevnar 2015; zostavax 2016, shingrex discussed, states not interested;  had a flu shot   -CCS:  colonoscopy 03/2006, 2 hyperplastic polyps.  Has iron deficiency, saw GI 02/2016 and declined GI workup -Prostate cancer screening: we agreed no further screening  -Labs: CMP, FLP, CBC, A1c -Encouraged healthy diet and age appropriate exercise

## 2017-01-16 NOTE — Assessment & Plan Note (Addendum)
DM: Diet controlled check A1c. HTN well-controlled on amlodipine, Vasotec, metoprolol.   Hyperlipidemia: On simvastatin, checking labs CAD, cardiomyopathy, carotid stenosis: Saw cardiology 11/21/2016, felt to be stable, had a repeated carotid US Neck mass: Right submandibular area, on chart review that has been present for years.  Nevertheless the patient is worried, recommend to come back in two months for a recheck. Iron deficiency: Checking a CBC.  Declined a colonoscopy again today. HOH: request audiology referral RTC 2 months

## 2017-01-16 NOTE — Progress Notes (Signed)
Pre visit review using our clinic review tool, if applicable. No additional management support is needed unless otherwise documented below in the visit note. 

## 2017-01-16 NOTE — Patient Instructions (Signed)
GO TO THE LAB : Get the blood work     GO TO THE FRONT DESK Schedule your next appointment for a 76-month follow-up to check the gland in your neck  Consider Medicare wellness visit with one  of our nurses

## 2017-03-18 ENCOUNTER — Ambulatory Visit: Payer: Self-pay | Admitting: Internal Medicine

## 2017-04-08 DIAGNOSIS — H10523 Angular blepharoconjunctivitis, bilateral: Secondary | ICD-10-CM | POA: Diagnosis not present

## 2017-04-11 ENCOUNTER — Other Ambulatory Visit: Payer: Self-pay | Admitting: Internal Medicine

## 2017-05-07 ENCOUNTER — Other Ambulatory Visit: Payer: Self-pay | Admitting: Internal Medicine

## 2017-06-24 ENCOUNTER — Ambulatory Visit (INDEPENDENT_AMBULATORY_CARE_PROVIDER_SITE_OTHER): Payer: Medicare HMO | Admitting: Internal Medicine

## 2017-06-24 ENCOUNTER — Encounter: Payer: Self-pay | Admitting: Internal Medicine

## 2017-06-24 VITALS — BP 130/78 | HR 59 | Resp 16 | Ht 68.0 in | Wt 201.6 lb

## 2017-06-24 DIAGNOSIS — D649 Anemia, unspecified: Secondary | ICD-10-CM | POA: Diagnosis not present

## 2017-06-24 DIAGNOSIS — E1140 Type 2 diabetes mellitus with diabetic neuropathy, unspecified: Secondary | ICD-10-CM | POA: Diagnosis not present

## 2017-06-24 DIAGNOSIS — D696 Thrombocytopenia, unspecified: Secondary | ICD-10-CM | POA: Diagnosis not present

## 2017-06-24 DIAGNOSIS — I10 Essential (primary) hypertension: Secondary | ICD-10-CM

## 2017-06-24 LAB — CBC WITH DIFFERENTIAL/PLATELET
BASOS PCT: 1.4 % (ref 0.0–3.0)
Basophils Absolute: 0.1 10*3/uL (ref 0.0–0.1)
EOS PCT: 14.2 % — AB (ref 0.0–5.0)
Eosinophils Absolute: 0.9 10*3/uL — ABNORMAL HIGH (ref 0.0–0.7)
HCT: 51.9 % (ref 39.0–52.0)
HEMOGLOBIN: 17.8 g/dL — AB (ref 13.0–17.0)
Lymphocytes Relative: 26.6 % (ref 12.0–46.0)
Lymphs Abs: 1.8 10*3/uL (ref 0.7–4.0)
MCHC: 34.2 g/dL (ref 30.0–36.0)
MCV: 96.9 fl (ref 78.0–100.0)
MONO ABS: 0.7 10*3/uL (ref 0.1–1.0)
Monocytes Relative: 11 % (ref 3.0–12.0)
Neutro Abs: 3.1 10*3/uL (ref 1.4–7.7)
Neutrophils Relative %: 46.8 % (ref 43.0–77.0)
Platelets: 125 10*3/uL — ABNORMAL LOW (ref 150.0–400.0)
RBC: 5.36 Mil/uL (ref 4.22–5.81)
RDW: 12.7 % (ref 11.5–15.5)
WBC: 6.7 10*3/uL (ref 4.0–10.5)

## 2017-06-24 LAB — BASIC METABOLIC PANEL
BUN: 18 mg/dL (ref 6–23)
CHLORIDE: 103 meq/L (ref 96–112)
CO2: 29 mEq/L (ref 19–32)
Calcium: 9.4 mg/dL (ref 8.4–10.5)
Creatinine, Ser: 1.17 mg/dL (ref 0.40–1.50)
GFR: 64.15 mL/min (ref >60.00)
GLUCOSE: 100 mg/dL — AB (ref 70–99)
POTASSIUM: 4.3 meq/L (ref 3.5–5.1)
Sodium: 137 mEq/L (ref 135–145)

## 2017-06-24 LAB — HEMOGLOBIN A1C: HEMOGLOBIN A1C: 6.3 % (ref 4.6–6.5)

## 2017-06-24 LAB — FERRITIN: Ferritin: 161.7 ng/mL (ref 22.0–322.0)

## 2017-06-24 LAB — IRON: Iron: 87 ug/dL (ref 42–165)

## 2017-06-24 NOTE — Progress Notes (Signed)
Subjective:    Patient ID: [name] Little, male    DOB: Aug 25, 1940, 77 y.o.   MRN: [id number]  DOS:  06/24/2017 Type of visit - description : rov Interval history: No major concerns. Good compliance with medications. Ambulatory BP in the 130s. Last potassium was slightly elevated, reports he received a low potassium diet and is trying to follow it.   Review of Systems Denies chest pain, difficulty breathing.  No lower extremity edema   Past Medical History:  Diagnosis Date  . Allergic rhinitis   . Anxiety and depression   . Arthritis   . CAD (coronary artery disease)   . Diabetes mellitus   . GERD (gastroesophageal reflux disease)   . Headache(784.0) 06/2003   s/p neuro eval ? migraine  . Hyperlipidemia   . Hyperplastic colon polyp 2008  . Hypertension   . Iron deficiency    h/o  . Ischemic cardiomyopathy    EF had 30-35%. The most recent EF was 50% on echo in september 2009  . PUD (peptic ulcer disease) 7/11   EGD showed few small ulcers, esophagues strecthed  . Vision abnormalities     Past Surgical History:  Procedure Laterality Date  . CATARACT EXTRACTION Right 07-2012  . CORONARY ARTERY BYPASS GRAFT  2004   LIMA to the LADs, sequentail SVG to intermediated and obtuse marginal, sequential SVG to PDA and posterolaterla  . INGUINAL HERNIA REPAIR Bilateral   . TONSILLECTOMY      Social History   Socioeconomic History  . Marital status: Married    Spouse name: Not on file  . Number of children: 0  . Years of education: Not on file  . Highest education level: Not on file  Occupational History  . Occupation: retired Paramedic  . Financial resource strain: Not on file  . Food insecurity:    Worry: Not on file    Inability: Not on file  . Transportation needs:    Medical: Not on file    Non-medical: Not on file  Tobacco Use  . Smoking status: Former Smoker    Types: Cigarettes    Last attempt to quit: 12/16/1976    Years since  quitting: 40.5  . Smokeless tobacco: Never Used  Substance and Sexual Activity  . Alcohol use: No    Alcohol/week: 0.0 oz  . Drug use: No  . Sexual activity: Not on file  Lifestyle  . Physical activity:    Days per week: Not on file    Minutes per session: Not on file  . Stress: Not on file  Relationships  . Social connections:    Talks on phone: Not on file    Gets together: Not on file    Attends religious service: Not on file    Active member of club or organization: Not on file    Attends meetings of clubs or organizations: Not on file    Relationship status: Not on file  . Intimate partner violence:    Fear of current or ex partner: Not on file    Emotionally abused: Not on file    Physically abused: Not on file    Forced sexual activity: Not on file  Other Topics Concern  . Not on file  Social History Narrative   Married , lives w/ wife , no children            Allergies as of 06/24/2017      Reactions   Micardis [  telmisartan]    REACTION: diarrhea      Medication List        Accurate as of 06/24/17 11:59 PM. Always use your most recent med list.          amLODipine 2.5 MG tablet Commonly known as:  NORVASC Take 1 tablet (2.5 mg total) by mouth daily.   aspirin 81 MG chewable tablet 2 daily.   CoQ10 400 MG Caps Take 1 capsule by mouth daily.   enalapril 10 MG tablet Commonly known as:  VASOTEC TAKE 1 TABLET TWICE A DAY   ferrous sulfate 325 (65 FE) MG tablet Take 325 mg by mouth daily with breakfast.   fish oil-omega-3 fatty acids 1000 MG capsule Take 1 g by mouth daily.   FLAX SEED OIL PO Take by mouth.   metoprolol tartrate 100 MG tablet Commonly known as:  LOPRESSOR Take 1 tablet (100 mg total) by mouth 2 (two) times daily.   neomycin-polymyxin b-dexamethasone 3.5-10000-0.1 Susp Commonly known as:  MAXITROL APPLY 1 DROP INTO BOTH EYES FOUR TIMES A DAY   omeprazole 40 MG capsule Commonly known as:  PRILOSEC Take 1 capsule (40 mg  total) by mouth daily.   simvastatin 80 MG tablet Commonly known as:  ZOCOR Take 1 tablet (80 mg total) by mouth at bedtime.   TYLENOL 8 HOUR PO Take 1 tablet by mouth as needed.          Objective:   Physical Exam BP 130/78 (BP Location: Left Arm, Patient Position: Sitting, Cuff Size: Normal)   Pulse (!) 59   Resp 16   Ht 5\' 8"  (1.727 m)   Wt 201 lb 9.6 oz (91.4 kg)   SpO2 99%   BMI 30.65 kg/m  General:   Well developed, well nourished . NAD.  HEENT:  Normocephalic . Face symmetric, atraumatic Neck: See last visit, neck exam unchanged. Lungs:  CTA B Normal respiratory effort, no intercostal retractions, no accessory muscle use. Heart: RRR,  no murmur.  No pretibial edema bilaterally  DIABETIC FEET EXAM: No lower extremity edema Normal pedal pulses bilaterally Skin normal, nails normal, no calluses Pinprick examination of the feet normal. Skin: Not pale. Not jaundice Neurologic:  alert & oriented X3.  Speech normal, gait appropriate for age and unassisted Psych--  Cognition and judgment appear intact.  Cooperative with normal attention span and concentration.  Behavior appropriate. No anxious or depressed appearing.      Assessment & Plan:    Assessment  DM with neuropathy (Occasional numbness, normal pinprick exam 06-2015) HTN: rx lasix 01-2015, creat increased, d/c lasix Hyperlipidemia Anxiety, depression, insomnia Chronic fatigue Chronic renal insufficiency   CV:  --CAD -- carotid artery disease, US done 10-2014 Hypogonadism DJD   Hematology:  -Iron deficiency: saw GI 02/2016, declined a GI workup. -Thrombocytopenia History of a neck mass, evaluated by ENT remotely  PLAN: DM: Diet controlled, check A1c, feet exam normal HTN: Seems well controlled, continue amlodipine, enalapril, metoprolol.  Last potassium slightly elevated, following a low K diet, check a BMP Neck mass: Unchanged. Iron deficiency: Last hemoglobin very good, still taking iron  supplements.  Check a CBC, iron, ferritin.  Consider DC iron  supplements Thrombocytopenia: Within the last couple of CBCs.  Checking labs RTC 5 months

## 2017-06-24 NOTE — Patient Instructions (Signed)
GO TO THE LAB : Get the blood work     GO TO THE FRONT DESK Schedule your next appointment for a checkup in 5 months

## 2017-06-25 NOTE — Assessment & Plan Note (Signed)
DM: Diet controlled, check A1c, feet exam normal HTN: Seems well controlled, continue amlodipine, enalapril, metoprolol.  Last potassium slightly elevated, following a low K diet, check a BMP Neck mass: Unchanged. Iron deficiency: Last hemoglobin very good, still taking iron supplements.  Check a CBC, iron, ferritin.  Consider DC iron  supplements Thrombocytopenia: Within the last couple of CBCs.  Checking labs RTC 5 months

## 2017-06-28 ENCOUNTER — Other Ambulatory Visit: Payer: Self-pay | Admitting: Internal Medicine

## 2017-08-07 DIAGNOSIS — H52223 Regular astigmatism, bilateral: Secondary | ICD-10-CM | POA: Diagnosis not present

## 2017-08-07 DIAGNOSIS — H25812 Combined forms of age-related cataract, left eye: Secondary | ICD-10-CM | POA: Diagnosis not present

## 2017-08-07 DIAGNOSIS — H5203 Hypermetropia, bilateral: Secondary | ICD-10-CM | POA: Diagnosis not present

## 2017-08-07 DIAGNOSIS — H04123 Dry eye syndrome of bilateral lacrimal glands: Secondary | ICD-10-CM | POA: Diagnosis not present

## 2017-08-07 DIAGNOSIS — H35033 Hypertensive retinopathy, bilateral: Secondary | ICD-10-CM | POA: Diagnosis not present

## 2017-08-07 DIAGNOSIS — Z961 Presence of intraocular lens: Secondary | ICD-10-CM | POA: Diagnosis not present

## 2017-08-07 DIAGNOSIS — I10 Essential (primary) hypertension: Secondary | ICD-10-CM | POA: Diagnosis not present

## 2017-08-07 DIAGNOSIS — H3561 Retinal hemorrhage, right eye: Secondary | ICD-10-CM | POA: Diagnosis not present

## 2017-08-07 DIAGNOSIS — H35031 Hypertensive retinopathy, right eye: Secondary | ICD-10-CM | POA: Diagnosis not present

## 2017-08-07 DIAGNOSIS — E119 Type 2 diabetes mellitus without complications: Secondary | ICD-10-CM | POA: Diagnosis not present

## 2017-08-08 ENCOUNTER — Encounter: Payer: Self-pay | Admitting: Internal Medicine

## 2017-08-20 LAB — HM DIABETES EYE EXAM

## 2017-08-27 ENCOUNTER — Encounter: Payer: Self-pay | Admitting: Internal Medicine

## 2017-09-15 ENCOUNTER — Other Ambulatory Visit: Payer: Self-pay | Admitting: Internal Medicine

## 2017-09-15 ENCOUNTER — Other Ambulatory Visit: Payer: Self-pay | Admitting: Cardiology

## 2017-09-15 NOTE — Telephone Encounter (Signed)
Rx has been sent to the pharmacy electronically. ° °

## 2017-11-06 DIAGNOSIS — H3561 Retinal hemorrhage, right eye: Secondary | ICD-10-CM | POA: Diagnosis not present

## 2017-11-25 ENCOUNTER — Encounter: Payer: Self-pay | Admitting: Internal Medicine

## 2017-11-25 ENCOUNTER — Ambulatory Visit (INDEPENDENT_AMBULATORY_CARE_PROVIDER_SITE_OTHER): Payer: Medicare HMO | Admitting: Internal Medicine

## 2017-11-25 VITALS — BP 128/68 | HR 68 | Temp 98.0°F | Resp 16 | Ht 68.0 in | Wt 199.4 lb

## 2017-11-25 DIAGNOSIS — K118 Other diseases of salivary glands: Secondary | ICD-10-CM

## 2017-11-25 DIAGNOSIS — I10 Essential (primary) hypertension: Secondary | ICD-10-CM

## 2017-11-25 DIAGNOSIS — E1140 Type 2 diabetes mellitus with diabetic neuropathy, unspecified: Secondary | ICD-10-CM | POA: Diagnosis not present

## 2017-11-25 DIAGNOSIS — D696 Thrombocytopenia, unspecified: Secondary | ICD-10-CM | POA: Diagnosis not present

## 2017-11-25 LAB — CBC WITH DIFFERENTIAL/PLATELET
BASOS PCT: 1.2 % (ref 0.0–3.0)
Basophils Absolute: 0.1 10*3/uL (ref 0.0–0.1)
EOS ABS: 1.1 10*3/uL — AB (ref 0.0–0.7)
EOS PCT: 18.3 % — AB (ref 0.0–5.0)
HEMATOCRIT: 47.1 % (ref 39.0–52.0)
HEMOGLOBIN: 16.2 g/dL (ref 13.0–17.0)
LYMPHS PCT: 21.8 % (ref 12.0–46.0)
Lymphs Abs: 1.3 10*3/uL (ref 0.7–4.0)
MCHC: 34.5 g/dL (ref 30.0–36.0)
MCV: 95.5 fl (ref 78.0–100.0)
Monocytes Absolute: 0.4 10*3/uL (ref 0.1–1.0)
Monocytes Relative: 7.1 % (ref 3.0–12.0)
Neutro Abs: 3.2 10*3/uL (ref 1.4–7.7)
Neutrophils Relative %: 51.6 % (ref 43.0–77.0)
Platelets: 116 10*3/uL — ABNORMAL LOW (ref 150.0–400.0)
RBC: 4.93 Mil/uL (ref 4.22–5.81)
RDW: 12.9 % (ref 11.5–15.5)
WBC: 6.2 10*3/uL (ref 4.0–10.5)

## 2017-11-25 LAB — BASIC METABOLIC PANEL
BUN: 19 mg/dL (ref 6–23)
CALCIUM: 8.8 mg/dL (ref 8.4–10.5)
CHLORIDE: 103 meq/L (ref 96–112)
CO2: 30 meq/L (ref 19–32)
Creatinine, Ser: 1.09 mg/dL (ref 0.40–1.50)
GFR: 69.53 mL/min (ref >60.00)
Glucose, Bld: 126 mg/dL — ABNORMAL HIGH (ref 70–99)
Potassium: 4.3 mEq/L (ref 3.5–5.1)
SODIUM: 138 meq/L (ref 135–145)

## 2017-11-25 LAB — HEMOGLOBIN A1C: Hgb A1c MFr Bld: 6.3 % (ref 4.6–6.5)

## 2017-11-25 MED ORDER — AZELASTINE HCL 0.1 % NA SOLN: 2.0000 | Freq: Every evening | NASAL | 3 refills | Status: DC | PRN

## 2017-11-25 NOTE — Progress Notes (Signed)
Subjective:    Patient ID: [name] Little, male    DOB: 1941/01/27, 77 y.o.   MRN: [id number]  DOS:  11/25/2017 Type of visit - description : rov Interval history: Reports 3 to 4 weeks history of nasal congestion, blowing clear nasal discharge, cough with clear sputum. Again is concerned about the swelling gland in the neck.  Reportedly it was swollen and  bigger 2 weeks ago but is decreasing in size.    Review of Systems Denies fever chills No chest pain no difficulty breathing No itchy eyes, nose.  No sneezing.   Past Medical History:  Diagnosis Date  . Allergic rhinitis   . Anxiety and depression   . Arthritis   . CAD (coronary artery disease)   . Diabetes mellitus   . GERD (gastroesophageal reflux disease)   . Headache(784.0) 06/2003   s/p neuro eval ? migraine  . Hyperlipidemia   . Hyperplastic colon polyp 2008  . Hypertension   . Iron deficiency    h/o  . Ischemic cardiomyopathy    EF had 30-35%. The most recent EF was 50% on echo in september 2009  . PUD (peptic ulcer disease) 7/11   EGD showed few small ulcers, esophagues strecthed  . Vision abnormalities     Past Surgical History:  Procedure Laterality Date  . CATARACT EXTRACTION Right 07-2012  . CORONARY ARTERY BYPASS GRAFT  2004   LIMA to the LADs, sequentail SVG to intermediated and obtuse marginal, sequential SVG to PDA and posterolaterla  . INGUINAL HERNIA REPAIR Bilateral   . TONSILLECTOMY      Social History   Socioeconomic History  . Marital status: Married    Spouse name: Not on file  . Number of children: 0  . Years of education: Not on file  . Highest education level: Not on file  Occupational History  . Occupation: retired Paramedic  . Financial resource strain: Not on file  . Food insecurity:    Worry: Not on file    Inability: Not on file  . Transportation needs:    Medical: Not on file    Non-medical: Not on file  Tobacco Use  . Smoking status: Former  Smoker    Types: Cigarettes    Last attempt to quit: 12/16/1976    Years since quitting: 40.9  . Smokeless tobacco: Never Used  Substance and Sexual Activity  . Alcohol use: No    Alcohol/week: 0.0 standard drinks  . Drug use: No  . Sexual activity: Not on file  Lifestyle  . Physical activity:    Days per week: Not on file    Minutes per session: Not on file  . Stress: Not on file  Relationships  . Social connections:    Talks on phone: Not on file    Gets together: Not on file    Attends religious service: Not on file    Active member of club or organization: Not on file    Attends meetings of clubs or organizations: Not on file    Relationship status: Not on file  . Intimate partner violence:    Fear of current or ex partner: Not on file    Emotionally abused: Not on file    Physically abused: Not on file    Forced sexual activity: Not on file  Other Topics Concern  . Not on file  Social History Narrative   Married , lives w/ wife , no children  Allergies as of 11/25/2017      Reactions   Micardis [telmisartan]    REACTION: diarrhea      Medication List        Accurate as of 11/25/17 11:59 PM. Always use your most recent med list.          amLODipine 2.5 MG tablet Commonly known as:  NORVASC Take 1 tablet (2.5 mg total) by mouth daily.   aspirin 81 MG chewable tablet 2 daily.   azelastine 0.1 % nasal spray Commonly known as:  ASTELIN Place 2 sprays into both nostrils at bedtime as needed for rhinitis. Use in each nostril as directed   CoQ10 400 MG Caps Take 1 capsule by mouth daily.   enalapril 10 MG tablet Commonly known as:  VASOTEC Take 1 tablet (10 mg total) by mouth 2 (two) times daily.   fish oil-omega-3 fatty acids 1000 MG capsule Take 1 g by mouth daily.   FLAX SEED OIL PO Take by mouth.   metoprolol tartrate 100 MG tablet Commonly known as:  LOPRESSOR Take 1 tablet (100 mg total) by mouth 2 (two) times daily.     omeprazole 40 MG capsule Commonly known as:  PRILOSEC Take 1 capsule (40 mg total) by mouth daily.   simvastatin 80 MG tablet Commonly known as:  ZOCOR Take 1 tablet (80 mg total) by mouth at bedtime.   TYLENOL 8 HOUR PO Take 1 tablet by mouth as needed.          Objective:   Physical Exam BP 128/68 (BP Location: Left Arm, Patient Position: Sitting, Cuff Size: Small)   Pulse 68   Temp 98 F (36.7 C) (Oral)   Resp 16   Ht 5\' 8"  (1.727 m)   Wt 199 lb 6 oz (90.4 kg)   SpO2 98%   BMI 30.31 kg/m  General:   Well developed, NAD, see BMI.  HEENT:  Normocephalic . Face symmetric, atraumatic.  TMs normal.  Throat symmetric and not red. Neck: Again is noted a right-sided submandibular gland, this time is approximately 1.5 to 1.8 cm in size, slightly larger than before, slightly tender, nonfluctuant.  Mobile. Lungs:  CTA B Normal respiratory effort, no intercostal retractions, no accessory muscle use. Heart: RRR,  no murmur.  No pretibial edema bilaterally  Skin: Not pale. Not jaundice Neurologic:  alert & oriented X3.  Speech normal, gait appropriate for age and unassisted Psych--  Cognition and judgment appear intact.  Cooperative with normal attention span and concentration.  Behavior appropriate. No anxious or depressed appearing.      Assessment & Plan:    Assessment  DM with neuropathy (Occasional numbness, normal pinprick exam 06-2015) HTN: rx lasix 01-2015, creat increased, d/c lasix Hyperlipidemia Anxiety, depression, insomnia Chronic fatigue Chronic renal insufficiency   CV:  --CAD -- carotid artery disease, US done 10-2014 Hypogonadism DJD   Hematology:  -Iron deficiency: saw GI 02/2016, declined a GI workup. -Thrombocytopenia History of a neck mass, evaluated by ENT remotely  PLAN: DM: Diet controlled, check a A1c HTN: Continue amlodipine, Vasotec, metoprolol.  Check a BMP Anemia: Resolved, last CBC show a slightly decreased platelet count.   Recheck a CBC. URI: Although symptoms are going on for 3 to 4 weeks, I see no evidence of bacterial infection.  Conservative treatment.  Flu shot once is better Submandibular mass: The patient continued to be concerned about it, today the area seems a slightly larger and somewhat tender compared to the last time.  Refer to ENT. RTC 3- 4 months fasting CPX

## 2017-11-25 NOTE — Patient Instructions (Addendum)
GO TO THE LAB : Get the blood work     GO TO THE FRONT DESK Schedule your next appointment for a physical exam, fasting in 3 to 4 months    Get your flu shot as soon as your respiratory symptoms are better  For cough:  Take Mucinex DM twice a day as needed until better  For nasal congestion: Use OTC Flonase : 2 nasal sprays on each side of the nose in the morning until you feel better Use ASTELIN a prescribed spray : 2 nasal sprays on each side of the nose at night until you feel better (Sent to [facility])   Avoid decongestants such as  Pseudoephedrine or phenylephrine   Call if not gradually better over the next  10 days  Call anytime if the symptoms are severe

## 2017-11-25 NOTE — Progress Notes (Signed)
Pre visit review using our clinic review tool, if applicable. No additional management support is needed unless otherwise documented below in the visit note. 

## 2017-11-26 ENCOUNTER — Other Ambulatory Visit: Payer: Self-pay | Admitting: Internal Medicine

## 2017-11-26 MED ORDER — AZELASTINE HCL 0.1 % NA SOLN: 2.0000 | Freq: Every evening | NASAL | 3 refills | Status: DC | PRN

## 2017-11-26 NOTE — Addendum Note (Signed)
Addended byDamita Dunnings D on: 11/26/2017 09:26 AM   Modules accepted: Orders

## 2017-11-26 NOTE — Assessment & Plan Note (Signed)
DM: Diet controlled, check a A1c HTN: Continue amlodipine, Vasotec, metoprolol.  Check a BMP Anemia: Resolved, last CBC show a slightly decreased platelet count.  Recheck a CBC. URI: Although symptoms are going on for 3 to 4 weeks, I see no evidence of bacterial infection.  Conservative treatment.  Flu shot once is better Submandibular mass: The patient continued to be concerned about it, today the area seems a slightly larger and somewhat tender compared to the last time.  Refer to ENT. RTC 3- 4 months fasting CPX

## 2017-11-26 NOTE — Progress Notes (Signed)
azelastine

## 2017-12-02 ENCOUNTER — Encounter: Payer: Self-pay | Admitting: Internal Medicine

## 2017-12-02 DIAGNOSIS — K118 Other diseases of salivary glands: Secondary | ICD-10-CM

## 2017-12-05 ENCOUNTER — Encounter: Payer: Self-pay | Admitting: Internal Medicine

## 2017-12-17 DIAGNOSIS — K1123 Chronic sialoadenitis: Secondary | ICD-10-CM | POA: Diagnosis not present

## 2017-12-17 DIAGNOSIS — Z87891 Personal history of nicotine dependence: Secondary | ICD-10-CM | POA: Diagnosis not present

## 2017-12-17 DIAGNOSIS — K118 Other diseases of salivary glands: Secondary | ICD-10-CM | POA: Diagnosis not present

## 2017-12-17 DIAGNOSIS — K115 Sialolithiasis: Secondary | ICD-10-CM | POA: Diagnosis not present

## 2017-12-18 DIAGNOSIS — K1120 Sialoadenitis, unspecified: Secondary | ICD-10-CM | POA: Insufficient documentation

## 2017-12-18 DIAGNOSIS — K1123 Chronic sialoadenitis: Secondary | ICD-10-CM | POA: Insufficient documentation

## 2017-12-22 ENCOUNTER — Ambulatory Visit (HOSPITAL_COMMUNITY)
Admission: RE | Admit: 2017-12-22 | Discharge: 2017-12-22 | Disposition: A | Discharge status: Home / Self Care | Payer: Medicare HMO | Source: Ambulatory Visit | Attending: Cardiology | Admitting: Cardiology

## 2017-12-22 DIAGNOSIS — I6523 Occlusion and stenosis of bilateral carotid arteries: Secondary | ICD-10-CM | POA: Insufficient documentation

## 2017-12-22 DIAGNOSIS — R69 Illness, unspecified: Secondary | ICD-10-CM | POA: Diagnosis not present

## 2018-02-09 NOTE — Progress Notes (Signed)
HPI The patient returns for one year follow up of CAD.  Since I last saw him he has done well.  The patient denies any new symptoms such as chest discomfort, neck or arm discomfort. There has been no new shortness of breath, PND or orthopnea. There have been no reported palpitations, presyncope or syncope.  He has been active remodeling a house.  He actually just cleaned out a leg that was drained recently.  They were getting out debris including big and limbs and this was a lot of heavy work.  The patient denies any new symptoms such as chest discomfort, neck or arm discomfort. There has been no new shortness of breath, PND or orthopnea. There have been no reported palpitations, presyncope or syncope.   Allergies  Allergen Reactions  . Micardis [Telmisartan]     REACTION: diarrhea    Current Outpatient Medications  Medication Sig Dispense Refill  . amLODipine (NORVASC) 2.5 MG tablet Take 1 tablet (2.5 mg total) by mouth daily. 90 tablet 1  . aspirin 81 MG chewable tablet 2 daily.     Marland Kitchen azelastine (ASTELIN) 0.1 % nasal spray Place 2 sprays into both nostrils at bedtime as needed for rhinitis. 30 mL 3  . Coenzyme Q10 (COQ10) 400 MG CAPS Take 1 capsule by mouth daily.    . enalapril (VASOTEC) 10 MG tablet Take 1 tablet (10 mg total) by mouth 2 (two) times daily. 180 tablet 1  . fish oil-omega-3 fatty acids 1000 MG capsule Take 1 g by mouth daily.     . Flaxseed, Linseed, (FLAX SEED OIL PO) Take by mouth.     . metoprolol tartrate (LOPRESSOR) 100 MG tablet Take 1 tablet (100 mg total) by mouth 2 (two) times daily. 180 tablet 2  . omeprazole (PRILOSEC) 40 MG capsule Take 1 capsule (40 mg total) by mouth daily. 90 capsule 3  . simvastatin (ZOCOR) 80 MG tablet Take 1 tablet (80 mg total) by mouth at bedtime. 90 tablet 2  . Acetaminophen (TYLENOL 8 HOUR PO) Take 1 tablet by mouth as needed.     No current facility-administered medications for this visit.     Past Medical History:    Diagnosis Date  . Allergic rhinitis   . Anxiety and depression   . Arthritis   . CAD (coronary artery disease)   . Diabetes mellitus   . GERD (gastroesophageal reflux disease)   . Headache(784.0) 06/2003   s/p neuro eval ? migraine  . Hyperlipidemia   . Hyperplastic colon polyp 2008  . Hypertension   . Iron deficiency    h/o  . Ischemic cardiomyopathy    EF had 30-35%. The most recent EF was 50% on echo in september 2009  . PUD (peptic ulcer disease) 7/11   EGD showed few small ulcers, esophagues strecthed  . Vision abnormalities     Past Surgical History:  Procedure Laterality Date  . CATARACT EXTRACTION Right 07-2012  . CORONARY ARTERY BYPASS GRAFT  2004   LIMA to the LADs, sequentail SVG to intermediated and obtuse marginal, sequential SVG to PDA and posterolaterla  . INGUINAL HERNIA REPAIR Bilateral   . TONSILLECTOMY      ROS: As stated in the HPI and negative for all other systems.  PHYSICAL EXAM BP 122/76   Pulse (!) 56   Ht 5\' 10"  (1.778 m)   Wt 203 lb 9.6 oz (92.4 kg)   BMI 29.21 kg/m   GENERAL:  Well appearing NECK:  No jugular venous distention, waveform within normal limits, carotid upstroke brisk and symmetric, no bruits, no thyromegaly LUNGS:  Clear to auscultation bilaterally CHEST:  Well healed sternotomy scar. HEART:  PMI not displaced or sustained,S1 and S2 within normal limits, no S3, no S4, no clicks, no rubs, no murmurs ABD:  Flat, positive bowel sounds normal in frequency in pitch, no bruits, no rebound, no guarding, no midline pulsatile mass, no hepatomegaly, no splenomegaly EXT:  2 plus pulses throughout, mild right greater than left leg edema, no cyanosis no clubbing    EKG:  Sinus rhythm, rate 56, axis within normal limits, old inferior infarct, old anteroseptal infarct, no acute ST-T wave changes   Lab Results  Component Value Date   CHOL 149 01/16/2017   TRIG 150.0 (H) 01/16/2017   HDL 29.80 (L) 01/16/2017   [place] 90 01/16/2017      ASSESSMENT AND PLAN  CAD -  The patient has no new sypmtoms.  No further cardiovascular testing is indicated.  We will continue with aggressive risk reduction and meds as listed.  CARDIOMYOPATHY - He had a mildy reduced EF in 2016.   He seems to be euvolemic with some mild lower extremity edema.  No further testing is indicated.  HYPERTENSION -  The blood pressure is normal.  He will continue the meds as listed.  HYPERLIPIDEMIA -  We did discuss that there is a relative contraindication to 80 mg of simvastatin with the low-dose of Norvasc.  However, he has had no problems with this.  I am going to repeat a lipid profile and if he is still not at target as he was last year I will be switching him to Lipitor or Crestor.  He and I had this conversation.  He will come back to his primary care office for fasting lipid profile.  CAROTID STENOSIS - He had mild plaque in October this year.  No change in therapy or further testing is indicated.   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  It is a little bit worse on the leg where he had the saphenous vein grafts harvested. We told him that he ccan get compression stockings in  at [employer] and gave him this flier.

## 2018-02-10 ENCOUNTER — Encounter: Payer: Self-pay | Admitting: Cardiology

## 2018-02-10 ENCOUNTER — Ambulatory Visit: Payer: Medicare HMO | Admitting: Cardiology

## 2018-02-10 VITALS — BP 122/76 | HR 56 | Ht 70.0 in | Wt 203.6 lb

## 2018-02-10 DIAGNOSIS — I2510 Atherosclerotic heart disease of native coronary artery without angina pectoris: Secondary | ICD-10-CM | POA: Diagnosis not present

## 2018-02-10 DIAGNOSIS — E785 Hyperlipidemia, unspecified: Secondary | ICD-10-CM

## 2018-02-10 DIAGNOSIS — I10 Essential (primary) hypertension: Secondary | ICD-10-CM

## 2018-02-10 NOTE — Patient Instructions (Signed)
Medication Instructions:  Continue current medications  If you need a refill on your cardiac medications before your next appointment, please call your pharmacy.  Labwork: Fasting Lipids HERE IN OUR OFFICE AT [facility]  Take the provided lab slips with you to the lab for your blood draw.   You will need to fast. DO NOT EAT OR DRINK PAST MIDNIGHT.   If you have labs (blood work) drawn today and your tests are completely normal, you will receive your results only by: Marland Kitchen MyChart Message (if you have MyChart) OR . A paper copy in the mail If you have any lab test that is abnormal or we need to change your treatment, we will call you to review the results.  Testing/Procedures: None ordered  Follow-Up: You will need a follow up appointment in 1 Year.  Please call our office 2 months in [phone]) to schedule the (1 Year) appointment.  You may see  DR Percival Spanish, or one of the following Advanced Practice Providers on your designated Care Team:    . Rosaria Ferries, PA-C  -[place], DNP, ANP  . Kerin Ransom, [place]  . Almyra Deforest, PA-C -Kirke Corin, PA-C  At [facility], you and your health needs are our priority.  As part of our continuing mission to provide you with exceptional heart care, we have created designated Provider Care Teams.  These Care Teams include your primary Cardiologist (physician) and Advanced Practice Providers (APPs -  Physician Assistants and Nurse Practitioners) who all work together to provide you with the care you need, when you need it.

## 2018-02-28 ENCOUNTER — Other Ambulatory Visit: Payer: Self-pay | Admitting: Internal Medicine

## 2018-03-11 DIAGNOSIS — Z0100 Encounter for examination of eyes and vision without abnormal findings: Secondary | ICD-10-CM | POA: Diagnosis not present

## 2018-03-23 ENCOUNTER — Ambulatory Visit: Payer: Self-pay | Admitting: Internal Medicine

## 2018-03-23 DIAGNOSIS — Z0289 Encounter for other administrative examinations: Secondary | ICD-10-CM

## 2018-03-26 ENCOUNTER — Other Ambulatory Visit: Payer: Self-pay | Admitting: Cardiology

## 2018-04-11 ENCOUNTER — Other Ambulatory Visit: Payer: Self-pay | Admitting: Internal Medicine

## 2018-04-22 ENCOUNTER — Other Ambulatory Visit: Payer: Self-pay | Admitting: Internal Medicine

## 2018-04-23 ENCOUNTER — Ambulatory Visit (INDEPENDENT_AMBULATORY_CARE_PROVIDER_SITE_OTHER): Payer: Medicare HMO | Admitting: Internal Medicine

## 2018-04-23 ENCOUNTER — Encounter: Payer: Self-pay | Admitting: Internal Medicine

## 2018-04-23 VITALS — BP 132/76 | HR 54 | Temp 98.0°F | Resp 16 | Ht 70.0 in | Wt 200.1 lb

## 2018-04-23 DIAGNOSIS — Z0000 Encounter for general adult medical examination without abnormal findings: Secondary | ICD-10-CM

## 2018-04-23 DIAGNOSIS — N402 Nodular prostate without lower urinary tract symptoms: Secondary | ICD-10-CM | POA: Diagnosis not present

## 2018-04-23 DIAGNOSIS — R399 Unspecified symptoms and signs involving the genitourinary system: Secondary | ICD-10-CM

## 2018-04-23 LAB — CBC WITH DIFFERENTIAL/PLATELET
BASOS PCT: 1.2 % (ref 0.0–3.0)
Basophils Absolute: 0.1 10*3/uL (ref 0.0–0.1)
EOS PCT: 17.7 % — AB (ref 0.0–5.0)
Eosinophils Absolute: 1.2 10*3/uL — ABNORMAL HIGH (ref 0.0–0.7)
HEMATOCRIT: 52.2 % — AB (ref 39.0–52.0)
HEMOGLOBIN: 17.8 g/dL — AB (ref 13.0–17.0)
LYMPHS PCT: 24.1 % (ref 12.0–46.0)
Lymphs Abs: 1.7 10*3/uL (ref 0.7–4.0)
MCHC: 34.1 g/dL (ref 30.0–36.0)
MCV: 94.1 fl (ref 78.0–100.0)
MONO ABS: 0.6 10*3/uL (ref 0.1–1.0)
MONOS PCT: 9.1 % (ref 3.0–12.0)
Neutro Abs: 3.4 10*3/uL (ref 1.4–7.7)
Neutrophils Relative %: 47.9 % (ref 43.0–77.0)
Platelets: 113 10*3/uL — ABNORMAL LOW (ref 150.0–400.0)
RBC: 5.55 Mil/uL (ref 4.22–5.81)
RDW: 13.3 % (ref 11.5–15.5)
WBC: 7 10*3/uL (ref 4.0–10.5)

## 2018-04-23 LAB — LIPID PANEL
CHOLESTEROL: 128 mg/dL (ref 0–200)
HDL: 28.5 mg/dL — ABNORMAL LOW (ref >39.00)
LDL Cholesterol: 71 mg/dL (ref 0–99)
NONHDL: 99.47
Total CHOL/HDL Ratio: 4
Triglycerides: 140 mg/dL (ref 0.0–149.0)
VLDL: 28 mg/dL (ref 0.0–40.0)

## 2018-04-23 LAB — URINALYSIS, ROUTINE W REFLEX MICROSCOPIC
BILIRUBIN URINE: NEGATIVE
HGB URINE DIPSTICK: NEGATIVE
KETONES UR: NEGATIVE
LEUKOCYTE UA: NEGATIVE
NITRITE: NEGATIVE
Specific Gravity, Urine: 1.01 (ref 1.000–1.030)
Total Protein, Urine: NEGATIVE
Urine Glucose: NEGATIVE
Urobilinogen, UA: 0.2 (ref 0.0–1.0)
pH: 6.5 (ref 5.0–8.0)

## 2018-04-23 LAB — COMPREHENSIVE METABOLIC PANEL
ALBUMIN: 4 g/dL (ref 3.5–5.2)
ALK PHOS: 55 U/L (ref 39–117)
ALT: 18 U/L (ref 0–53)
AST: 22 U/L (ref 0–37)
BILIRUBIN TOTAL: 1 mg/dL (ref 0.2–1.2)
BUN: 19 mg/dL (ref 6–23)
CO2: 33 mEq/L — ABNORMAL HIGH (ref 19–32)
CREATININE: 1.27 mg/dL (ref 0.40–1.50)
Calcium: 9.6 mg/dL (ref 8.4–10.5)
Chloride: 100 mEq/L (ref 96–112)
GFR: 54.78 mL/min — ABNORMAL LOW (ref >60.00)
GLUCOSE: 107 mg/dL — AB (ref 70–99)
POTASSIUM: 5.1 meq/L (ref 3.5–5.1)
SODIUM: 139 meq/L (ref 135–145)
TOTAL PROTEIN: 6 g/dL (ref 6.0–8.3)

## 2018-04-23 LAB — PSA: PSA: 1.51 ng/mL (ref 0.10–4.00)

## 2018-04-23 LAB — TSH: TSH: 0.77 u[IU]/mL (ref 0.35–4.50)

## 2018-04-23 MED ORDER — AZELASTINE HCL 0.1 % NA SOLN: 2.0000 | Freq: Every evening | NASAL | 3 refills | Status: DC | PRN

## 2018-04-23 MED ORDER — ATORVASTATIN CALCIUM 40 MG PO TABS: 40.0000 mg | ORAL_TABLET | Freq: Every day | ORAL | 1 refills | Status: DC

## 2018-04-23 MED ORDER — TAMSULOSIN HCL 0.4 MG PO CAPS: 0.4000 mg | ORAL_CAPSULE | Freq: Every day | ORAL | 0 refills | Status: DC

## 2018-04-23 NOTE — Progress Notes (Signed)
Subjective:    Patient ID: [name] Little, male    DOB: [birth year], 78 y.o.   MRN: [id number]  DOS:  04/23/2018 Type of visit - description: Here for CPX Has few concerns:  For the last 2 months has noted sinus congestion, occasional cough and sneezing.  Taking sporadically Benadryl with some help. Also, chronic urinary frequency.  Slightly worse in the last 2 months.  Denies fever, chills, dysuria or gross hematuria. Slightly slow flow?.  Review of Systems  Other than above, a 14 point review of systems is negative    Past Medical History:  Diagnosis Date  . Allergic rhinitis   . Anxiety and depression   . Arthritis   . CAD (coronary artery disease)   . Diabetes mellitus   . GERD (gastroesophageal reflux disease)   . Headache(784.0) 06/2003   s/p neuro eval ? migraine  . Hyperlipidemia   . Hyperplastic colon polyp 2008  . Hypertension   . Iron deficiency    h/o  . Ischemic cardiomyopathy    EF had 30-35%. The most recent EF was 50% on echo in september 2009  . PUD (peptic ulcer disease) 7/11   EGD showed few small ulcers, esophagues strecthed  . Vision abnormalities     Past Surgical History:  Procedure Laterality Date  . CATARACT EXTRACTION Right 07-2012  . CORONARY ARTERY BYPASS GRAFT  2004   LIMA to the LADs, sequentail SVG to intermediated and obtuse marginal, sequential SVG to PDA and posterolaterla  . INGUINAL HERNIA REPAIR Bilateral   . TONSILLECTOMY      Social History   Socioeconomic History  . Marital status: Married    Spouse name: Not on file  . Number of children: 0  . Years of education: Not on file  . Highest education level: Not on file  Occupational History  . Occupation: retired Paramedic  . Financial resource strain: Not on file  . Food insecurity:    Worry: Not on file    Inability: Not on file  . Transportation needs:    Medical: Not on file    Non-medical: Not on file  Tobacco Use  . Smoking status: Former  Smoker    Types: Cigarettes    Last attempt to quit: 12/16/1976    Years since quitting: 41.3  . Smokeless tobacco: Never Used  Substance and Sexual Activity  . Alcohol use: No    Alcohol/week: 0.0 standard drinks  . Drug use: No  . Sexual activity: Not on file  Lifestyle  . Physical activity:    Days per week: Not on file    Minutes per session: Not on file  . Stress: Not on file  Relationships  . Social connections:    Talks on phone: Not on file    Gets together: Not on file    Attends religious service: Not on file    Active member of club or organization: Not on file    Attends meetings of clubs or organizations: Not on file    Relationship status: Not on file  . Intimate partner violence:    Fear of current or ex partner: Not on file    Emotionally abused: Not on file    Physically abused: Not on file    Forced sexual activity: Not on file  Other Topics Concern  . Not on file  Social History Narrative   Married , lives w/ wife , no children  Family History  Problem Relation Age of Onset  . Diabetes Father   . Heart attack Father   . Heart attack Mother   . Cancer Paternal Grandfather        type unknown  . Colon cancer Neg Hx   . Prostate cancer Neg Hx      Allergies as of 04/23/2018      Reactions   Micardis [telmisartan]    REACTION: diarrhea      Medication List       Accurate as of April 23, 2018 11:59 PM. Always use your most recent med list.        amLODipine 2.5 MG tablet Commonly known as:  NORVASC Take 1 tablet (2.5 mg total) by mouth daily.   aspirin 81 MG chewable tablet 2 daily.   atorvastatin 40 MG tablet Commonly known as:  LIPITOR Take 1 tablet (40 mg total) by mouth at bedtime.   azelastine 0.1 % nasal spray Commonly known as:  ASTELIN Place 2 sprays into both nostrils at bedtime as needed for rhinitis.   CoQ10 400 MG Caps Take 1 capsule by mouth daily.   enalapril 10 MG tablet Commonly known as:   VASOTEC TAKE 1 TABLET TWICE A DAY   fish oil-omega-3 fatty acids 1000 MG capsule Take 1 g by mouth daily.   FLAX SEED OIL PO Take by mouth.   metoprolol tartrate 100 MG tablet Commonly known as:  LOPRESSOR Take 1 tablet (100 mg total) by mouth 2 (two) times daily.   omeprazole 40 MG capsule Commonly known as:  PRILOSEC Take 1 capsule (40 mg total) by mouth daily.   tamsulosin 0.4 MG Caps capsule Commonly known as:  FLOMAX Take 1 capsule (0.4 mg total) by mouth daily after supper.   TYLENOL 8 HOUR PO Take 1 tablet by mouth as needed.           Objective:   Physical Exam BP 132/76 (BP Location: Left Arm, Patient Position: Sitting, Cuff Size: Normal)   Pulse (!) 54   Temp 98 F (36.7 C) (Oral)   Resp 16   Ht 5\' 10"  (1.778 m)   Wt 200 lb 2 oz (90.8 kg)   SpO2 98%   BMI 28.71 kg/m  General: Well developed, NAD, BMI noted Neck: No  thyromegaly  HEENT:  Normocephalic . Face symmetric, atraumatic Lungs:  CTA B Normal respiratory effort, no intercostal retractions, no accessory muscle use. Heart: RRR,  no murmur.  Trace  pretibial edema bilaterally  Abdomen:  Not distended, soft, non-tender. No rebound or rigidity. DRE: Normal sphincter tone, stools brown. Prostate with minimal if any enlargement, he has a 3 or 4 mm nodule, not tender, on the right. Skin: Exposed areas without rash. Not pale. Not jaundice Neurologic:  alert & oriented X3.  Speech normal, gait appropriate for age and unassisted Strength symmetric and appropriate for age.  Psych: Cognition and judgment appear intact.  Cooperative with normal attention span and concentration.  Behavior appropriate. No anxious or depressed appearing.     Assessment    Assessment  DM with neuropathy (Occasional numbness, normal pinprick exam 06-2015) HTN: rx lasix 01-2015, creat increased, d/c lasix Hyperlipidemia Anxiety, depression, insomnia Chronic fatigue Chronic renal insufficiency   CV:  --CAD --  carotid artery disease, US done 10-2014 Hypogonadism DJD   Hematology:  -Iron deficiency: saw GI 02/2016, declined a GI workup. -Thrombocytopenia Neck mass, chronic saw ENT 12/2017 :DX chronic sialoadenitis, right submandibular salivary stone. Consider right submandibular  gland removal.   PLAN: DM: Last A1c very good,Continue diet control. HTN: Continue Vasotec, amlodipine, metoprolol. CAD: Saw cardiology in December 2019, no further testing was indicated at the time High cholesterol: On simvastatin, also on amlodipine.  Check FLP.  Recommend to switch simvastatin to Lipitor 40 mg.  Patient agreeable Sub-mandibular salivary stone.  Saw ENT: If symptomatic consider right submandibular gland removal. LUTS, prostate nodule: The patient has chronic LUTS, slightly worse lately, that is the reason why I did a rectal exam today, incidentally I found a small prostate nodule.  Last PSA August 2016: 0.53 Plan:  UA, urine culture, PSA.  Trial with Flomax.  Consider urology referral. Allergies: sx mild, rec astelin RTC fasting 10 weeks

## 2018-04-23 NOTE — Progress Notes (Signed)
Pre visit review using our clinic review tool, if applicable. No additional management support is needed unless otherwise documented below in the visit note. 

## 2018-04-23 NOTE — Assessment & Plan Note (Addendum)
-  Td 2014 ; pneumonia shot  2013 ; prevnar 2015; zostavax 2016, shingrex discussed again,  not interested;  had a flu shot   -CCS:  colonoscopy 03/2006, 2 hyperplastic polyps.  Has iron deficiency, saw GI 02/2016 and declined GI workup -Prostate cancer screening was not indicated however he has LUTS,  DRE show prostate nodule.  See comments. -Labs: CMP, FLP, CBC, TSH, PSA UA, urine culture -Encouraged healthy diet and age appropriate exercise

## 2018-04-23 NOTE — Patient Instructions (Addendum)
Please schedule Medicare Wellness with [name].   GO TO THE LAB : Get the blood work     GO TO THE FRONT DESK Schedule your next appointment   for a checkup in 10 weeks, fasting.  Stop simvastatin Start the new cholesterol medication called atorvastatin every night  Start tamsulosin (Flomax) at bedtime to help you with the urinary symptoms.

## 2018-04-24 LAB — URINE CULTURE
MICRO NUMBER:: 221024
Result:: NO GROWTH
SPECIMEN QUALITY:: ADEQUATE

## 2018-04-24 NOTE — Assessment & Plan Note (Signed)
DM: Last A1c very good,Continue diet control. HTN: Continue Vasotec, amlodipine, metoprolol. CAD: Saw cardiology in December 2019, no further testing was indicated at the time High cholesterol: On simvastatin, also on amlodipine.  Check FLP.  Recommend to switch simvastatin to Lipitor 40 mg.  Patient agreeable Sub-mandibular salivary stone.  Saw ENT: If symptomatic consider right submandibular gland removal. LUTS, prostate nodule: The patient has chronic LUTS, slightly worse lately, that is the reason why I did a rectal exam today, incidentally I found a small prostate nodule.  Last PSA August 2016: 0.53 Plan:  UA, urine culture, PSA.  Trial with Flomax.  Consider urology referral. Allergies: sx mild, rec astelin RTC fasting 10 weeks

## 2018-05-11 ENCOUNTER — Other Ambulatory Visit: Payer: Self-pay | Admitting: Internal Medicine

## 2018-05-27 ENCOUNTER — Encounter: Payer: Self-pay | Admitting: Internal Medicine

## 2018-06-16 ENCOUNTER — Encounter: Payer: Self-pay | Admitting: Internal Medicine

## 2018-06-29 ENCOUNTER — Encounter: Payer: Self-pay | Admitting: Internal Medicine

## 2018-07-02 ENCOUNTER — Encounter: Payer: Self-pay | Admitting: Internal Medicine

## 2018-07-02 ENCOUNTER — Ambulatory Visit (INDEPENDENT_AMBULATORY_CARE_PROVIDER_SITE_OTHER): Payer: Medicare HMO | Admitting: Internal Medicine

## 2018-07-02 ENCOUNTER — Other Ambulatory Visit: Payer: Self-pay

## 2018-07-02 DIAGNOSIS — R399 Unspecified symptoms and signs involving the genitourinary system: Secondary | ICD-10-CM

## 2018-07-02 DIAGNOSIS — I10 Essential (primary) hypertension: Secondary | ICD-10-CM | POA: Diagnosis not present

## 2018-07-02 DIAGNOSIS — E1140 Type 2 diabetes mellitus with diabetic neuropathy, unspecified: Secondary | ICD-10-CM | POA: Diagnosis not present

## 2018-07-02 DIAGNOSIS — N402 Nodular prostate without lower urinary tract symptoms: Secondary | ICD-10-CM

## 2018-07-02 MED ORDER — TAMSULOSIN HCL 0.4 MG PO CAPS: 0.4000 mg | ORAL_CAPSULE | Freq: Every day | ORAL | 2 refills | Status: DC

## 2018-07-02 NOTE — Progress Notes (Signed)
Subjective:    Patient ID: [name] Little, male    DOB: [birth year], 78 y.o.   MRN: [id number]  DOS:  07/02/2018 Type of visit - description: Attempted  to make this a video visit, due to technical difficulties from the patient side it was not possible  thus we proceeded with a Virtual Visit via Telephone    I connected with@ on 07/03/18 at  8:20 AM EDT by telephone and verified that I am speaking with the correct person using two identifiers.  THIS ENCOUNTER IS A VIRTUAL VISIT DUE TO COVID-19 - PATIENT WAS NOT SEEN IN THE OFFICE. PATIENT HAS CONSENTED TO VIRTUAL VISIT / TELEMEDICINE VISIT   Location of patient: home  Location of provider: office  I discussed the limitations, risks, security and privacy concerns of performing an evaluation and management service by telephone and the availability of in person appointments. I also discussed with the patient that there may be a patient responsible charge related to this service. The patient expressed understanding and agreed to proceed.   History of Present Illness: Follow-up At the last visit, he complained of L UTS, was prescribed Flomax, symptoms greatly improved. HTN: Good med compliance.  See ambulatory BPs. Allergies: Still an issue, mostly runny nose.  On Flonase and Astelin. COVID-19: Trying to follow all the rules, still goes to church, is a Automotive engineer, they meet at the parking lot and they keep safe distance.     Review of Systems No fever chills No chest pain no difficulty breathing No cough  Past Medical History:  Diagnosis Date  . Allergic rhinitis   . Anxiety and depression   . Arthritis   . CAD (coronary artery disease)   . Diabetes mellitus   . GERD (gastroesophageal reflux disease)   . Headache(784.0) 06/2003   s/p neuro eval ? migraine  . Hyperlipidemia   . Hyperplastic colon polyp 2008  . Hypertension   . Iron deficiency    h/o  . Ischemic cardiomyopathy    EF had 30-35%. The most recent  EF was 50% on echo in september 2009  . PUD (peptic ulcer disease) 7/11   EGD showed few small ulcers, esophagues strecthed  . Vision abnormalities     Past Surgical History:  Procedure Laterality Date  . CATARACT EXTRACTION Right 07-2012  . CORONARY ARTERY BYPASS GRAFT  2004   LIMA to the LADs, sequentail SVG to intermediated and obtuse marginal, sequential SVG to PDA and posterolaterla  . INGUINAL HERNIA REPAIR Bilateral   . TONSILLECTOMY      Social History   Socioeconomic History  . Marital status: Married    Spouse name: Not on file  . Number of children: 0  . Years of education: Not on file  . Highest education level: Not on file  Occupational History  . Occupation: retired Paramedic  . Financial resource strain: Not on file  . Food insecurity:    Worry: Not on file    Inability: Not on file  . Transportation needs:    Medical: Not on file    Non-medical: Not on file  Tobacco Use  . Smoking status: Former Smoker    Types: Cigarettes    Last attempt to quit: 12/16/1976    Years since quitting: 41.5  . Smokeless tobacco: Never Used  Substance and Sexual Activity  . Alcohol use: No    Alcohol/week: 0.0 standard drinks  . Drug use: No  . Sexual activity: Not  on file  Lifestyle  . Physical activity:    Days per week: Not on file    Minutes per session: Not on file  . Stress: Not on file  Relationships  . Social connections:    Talks on phone: Not on file    Gets together: Not on file    Attends religious service: Not on file    Active member of club or organization: Not on file    Attends meetings of clubs or organizations: Not on file    Relationship status: Not on file  . Intimate partner violence:    Fear of current or ex partner: Not on file    Emotionally abused: Not on file    Physically abused: Not on file    Forced sexual activity: Not on file  Other Topics Concern  . Not on file  Social History Narrative   Married , lives w/ wife  , no children            Allergies as of 07/02/2018      Reactions   Micardis [telmisartan]    REACTION: diarrhea      Medication List       Accurate as of July 02, 2018 11:59 PM. Always use your most recent med list.        amLODipine 2.5 MG tablet Commonly known as:  NORVASC Take 1 tablet (2.5 mg total) by mouth daily.   aspirin 81 MG chewable tablet 2 daily.   atorvastatin 40 MG tablet Commonly known as:  LIPITOR Take 1 tablet (40 mg total) by mouth at bedtime.   azelastine 0.1 % nasal spray Commonly known as:  ASTELIN Place 2 sprays into both nostrils at bedtime as needed for rhinitis.   CoQ10 400 MG Caps Take 1 capsule by mouth daily.   enalapril 10 MG tablet Commonly known as:  VASOTEC TAKE 1 TABLET TWICE A DAY   fish oil-omega-3 fatty acids 1000 MG capsule Take 1 g by mouth daily.   FLAX SEED OIL PO Take by mouth.   fluticasone 50 MCG/ACT nasal spray Commonly known as:  FLONASE Place 2 sprays into both nostrils daily.   metoprolol tartrate 100 MG tablet Commonly known as:  LOPRESSOR Take 1 tablet (100 mg total) by mouth 2 (two) times daily.   omeprazole 40 MG capsule Commonly known as:  PRILOSEC Take 1 capsule (40 mg total) by mouth daily.   tamsulosin 0.4 MG Caps capsule Commonly known as:  FLOMAX Take 1 capsule (0.4 mg total) by mouth daily after supper.   TYLENOL 8 HOUR PO Take 1 tablet by mouth as needed.           Objective:   Physical Exam There were no vitals taken for this visit. This is a phone conference, alert oriented x3, no apparent distress, seems in good spirits Blood pressure: 143/79.  Pulse 73, weight 203 pounds    Assessment     Assessment  DM with neuropathy (Occasional numbness, normal pinprick exam 06-2015) HTN: rx lasix 01-2015, creat increased, d/c lasix Hyperlipidemia Anxiety, depression, insomnia Chronic fatigue Chronic renal insufficiency   CV:  --CAD -- carotid artery disease, US done 10-2014  Hypogonadism DJD   Hematology:  -Iron deficiency: saw GI 02/2016, declined a GI workup. -Thrombocytopenia Neck mass, chronic saw ENT 12/2017 :DX chronic sialoadenitis, right submandibular salivary stone. Consider right submandibular gland removal.   PLAN: DM, diet controlled, last A1c 6.3. HTN: Very good BP today, continue with amlodipine, Vasotec, metoprolol.  LUTS, prostate nodule: Symptoms decreased with Flomax, refill sent.  PSA was normal, I do not believe aggressive further evaluation for an nodule is warranted at this time.  Will recheck at the next opportunity Allergies: Mild but is still an issue, in addition to Flonase and Astelin, recommend OTC Claritin as needed. COVID-19: Reinforced the need to follow the guidelines. RTC 6 months, will call and schedule       I discussed the assessment and treatment plan with the patient. The patient was provided an opportunity to ask questions and all were answered. The patient agreed with the plan and demonstrated an understanding of the instructions.   The patient was advised to call back or seek an in-person evaluation if the symptoms worsen or if the condition fails to improve as anticipated.  I provided 15 minutes of non-face-to-face time during this encounter.  Kathlene November, MD

## 2018-07-03 NOTE — Assessment & Plan Note (Signed)
DM, diet controlled, last A1c 6.3. HTN: Very good BP today, continue with amlodipine, Vasotec, metoprolol. LUTS, prostate nodule: Symptoms decreased with Flomax, refill sent.  PSA was normal, I do not believe aggressive further evaluation for an nodule is warranted at this time.  Will recheck at the next opportunity Allergies: Mild but is still an issue, in addition to Flonase and Astelin, recommend OTC Claritin as needed. COVID-19: Reinforced the need to follow the guidelines. RTC 6 months, will call and schedule

## 2018-08-06 DIAGNOSIS — H04123 Dry eye syndrome of bilateral lacrimal glands: Secondary | ICD-10-CM | POA: Diagnosis not present

## 2018-08-06 DIAGNOSIS — H1045 Other chronic allergic conjunctivitis: Secondary | ICD-10-CM | POA: Diagnosis not present

## 2018-08-06 DIAGNOSIS — H2512 Age-related nuclear cataract, left eye: Secondary | ICD-10-CM | POA: Diagnosis not present

## 2018-08-06 DIAGNOSIS — E119 Type 2 diabetes mellitus without complications: Secondary | ICD-10-CM | POA: Diagnosis not present

## 2018-08-06 DIAGNOSIS — H5203 Hypermetropia, bilateral: Secondary | ICD-10-CM | POA: Diagnosis not present

## 2018-08-06 DIAGNOSIS — Z9841 Cataract extraction status, right eye: Secondary | ICD-10-CM | POA: Diagnosis not present

## 2018-08-06 DIAGNOSIS — H52223 Regular astigmatism, bilateral: Secondary | ICD-10-CM | POA: Diagnosis not present

## 2018-08-06 DIAGNOSIS — H524 Presbyopia: Secondary | ICD-10-CM | POA: Diagnosis not present

## 2018-08-06 LAB — HM DIABETES EYE EXAM

## 2018-08-30 ENCOUNTER — Other Ambulatory Visit: Payer: Self-pay | Admitting: Internal Medicine

## 2018-10-01 ENCOUNTER — Other Ambulatory Visit: Payer: Self-pay | Admitting: Internal Medicine

## 2018-10-01 ENCOUNTER — Encounter: Payer: Self-pay | Admitting: Internal Medicine

## 2018-10-02 ENCOUNTER — Encounter: Payer: Self-pay | Admitting: *Deleted

## 2018-10-02 ENCOUNTER — Encounter: Payer: Self-pay | Admitting: Internal Medicine

## 2018-10-02 ENCOUNTER — Other Ambulatory Visit: Payer: Self-pay | Admitting: Internal Medicine

## 2018-10-02 NOTE — Telephone Encounter (Signed)
This encounter was created in error - please disregard.

## 2018-10-29 DIAGNOSIS — R69 Illness, unspecified: Secondary | ICD-10-CM | POA: Diagnosis not present

## 2018-10-30 ENCOUNTER — Encounter: Payer: Self-pay | Admitting: Internal Medicine

## 2018-12-01 ENCOUNTER — Other Ambulatory Visit: Payer: Self-pay

## 2018-12-02 ENCOUNTER — Ambulatory Visit (INDEPENDENT_AMBULATORY_CARE_PROVIDER_SITE_OTHER): Payer: Medicare HMO | Admitting: Internal Medicine

## 2018-12-02 ENCOUNTER — Encounter: Payer: Self-pay | Admitting: Internal Medicine

## 2018-12-02 ENCOUNTER — Other Ambulatory Visit: Payer: Self-pay

## 2018-12-02 VITALS — BP 169/99 | HR 54 | Temp 95.9°F | Resp 16 | Ht 70.0 in | Wt 206.5 lb

## 2018-12-02 DIAGNOSIS — E1140 Type 2 diabetes mellitus with diabetic neuropathy, unspecified: Secondary | ICD-10-CM

## 2018-12-02 DIAGNOSIS — N402 Nodular prostate without lower urinary tract symptoms: Secondary | ICD-10-CM

## 2018-12-02 DIAGNOSIS — I10 Essential (primary) hypertension: Secondary | ICD-10-CM

## 2018-12-02 DIAGNOSIS — E785 Hyperlipidemia, unspecified: Secondary | ICD-10-CM

## 2018-12-02 LAB — CBC WITH DIFFERENTIAL/PLATELET
Basophils Absolute: 0.1 10*3/uL (ref 0.0–0.1)
Basophils Relative: 1.1 % (ref 0.0–3.0)
Eosinophils Absolute: 1 10*3/uL — ABNORMAL HIGH (ref 0.0–0.7)
Eosinophils Relative: 13.5 % — ABNORMAL HIGH (ref 0.0–5.0)
HCT: 51.2 % (ref 39.0–52.0)
Hemoglobin: 16.8 g/dL (ref 13.0–17.0)
Lymphocytes Relative: 24.5 % (ref 12.0–46.0)
Lymphs Abs: 1.9 10*3/uL (ref 0.7–4.0)
MCHC: 32.8 g/dL (ref 30.0–36.0)
MCV: 92.7 fl (ref 78.0–100.0)
Monocytes Absolute: 0.8 10*3/uL (ref 0.1–1.0)
Monocytes Relative: 10.5 % (ref 3.0–12.0)
Neutro Abs: 3.9 10*3/uL (ref 1.4–7.7)
Neutrophils Relative %: 50.4 % (ref 43.0–77.0)
Platelets: 134 10*3/uL — ABNORMAL LOW (ref 150.0–400.0)
RBC: 5.52 Mil/uL (ref 4.22–5.81)
RDW: 14.2 % (ref 11.5–15.5)
WBC: 7.6 10*3/uL (ref 4.0–10.5)

## 2018-12-02 LAB — BASIC METABOLIC PANEL
BUN: 17 mg/dL (ref 6–23)
CO2: 29 mEq/L (ref 19–32)
Calcium: 9.4 mg/dL (ref 8.4–10.5)
Chloride: 102 mEq/L (ref 96–112)
Creatinine, Ser: 1.32 mg/dL (ref 0.40–1.50)
GFR: 52.31 mL/min — ABNORMAL LOW (ref >60.00)
Glucose, Bld: 133 mg/dL — ABNORMAL HIGH (ref 70–99)
Potassium: 4.7 mEq/L (ref 3.5–5.1)
Sodium: 138 mEq/L (ref 135–145)

## 2018-12-02 LAB — HEMOGLOBIN A1C: Hgb A1c MFr Bld: 7 % — ABNORMAL HIGH (ref 4.6–6.5)

## 2018-12-02 LAB — PSA: PSA: 0.95 ng/mL (ref 0.10–4.00)

## 2018-12-02 NOTE — Patient Instructions (Addendum)
Per our records you are due for an eye exam. Please contact your eye doctor to schedule an appointment. Please have them send copies of your office visit notes to [place]. Our fax number is [phone].  Please schedule Medicare Wellness with [name].    GO TO THE LAB : Get the blood work     GO TO THE FRONT DESK Schedule your next appointment   for a physical exam in 6 months   Continue checking your blood pressure, 2-3 times a month: BP GOAL is between 110/65 and  135/85. If it is consistently higher or lower, let me know   Diabetes Mellitus and Foot Care Foot care is an important part of your health, especially when you have diabetes. Diabetes may cause you to have problems because of poor blood flow (circulation) to your feet and legs, which can cause your skin to:  Become thinner and drier.  Break more easily.  Heal more slowly.  Peel and crack. You may also have nerve damage (neuropathy) in your legs and feet, causing decreased feeling in them. This means that you may not notice minor injuries to your feet that could lead to more serious problems. Noticing and addressing any potential problems early is the best way to prevent future foot problems. How to care for your feet Foot hygiene  Wash your feet daily with warm water and mild soap. Do not use hot water. Then, pat your feet and the areas between your toes until they are completely dry. Do not soak your feet as this can dry your skin.  Trim your toenails straight across. Do not dig under them or around the cuticle. File the edges of your nails with an emery board or nail file.  Apply a moisturizing lotion or petroleum jelly to the skin on your feet and to dry, brittle toenails. Use lotion that does not contain alcohol and is unscented. Do not apply lotion between your toes. Shoes and socks  Wear clean socks or stockings every day. Make sure they are not too tight. Do not wear knee-high stockings since they may decrease  blood flow to your legs.  Wear shoes that fit properly and have enough cushioning. Always look in your shoes before you put them on to be sure there are no objects inside.  To break in new shoes, wear them for just a few hours a day. This prevents injuries on your feet. Wounds, scrapes, corns, and calluses  Check your feet daily for blisters, cuts, bruises, sores, and redness. If you cannot see the bottom of your feet, use a mirror or ask someone for help.  Do not cut corns or calluses or try to remove them with medicine.  If you find a minor scrape, cut, or break in the skin on your feet, keep it and the skin around it clean and dry. You may clean these areas with mild soap and water. Do not clean the area with peroxide, alcohol, or iodine.  If you have a wound, scrape, corn, or callus on your foot, look at it several times a day to make sure it is healing and not infected. Check for: ? Redness, swelling, or pain. ? Fluid or blood. ? Warmth. ? Pus or a bad smell. General instructions  Do not cross your legs. This may decrease blood flow to your feet.  Do not use heating pads or hot water bottles on your feet. They may burn your skin. If you have lost feeling in your  feet or legs, you may not know this is happening until it is too late.  Protect your feet from hot and cold by wearing shoes, such as at the beach or on hot pavement.  Schedule a complete foot exam at least once a year (annually) or more often if you have foot problems. If you have foot problems, report any cuts, sores, or bruises to your health care provider immediately. Contact a health care provider if:  You have a medical condition that increases your risk of infection and you have any cuts, sores, or bruises on your feet.  You have an injury that is not healing.  You have redness on your legs or feet.  You feel burning or tingling in your legs or feet.  You have pain or cramps in your legs and feet.  Your legs  or feet are numb.  Your feet always feel cold.  You have pain around a toenail. Get help right away if:  You have a wound, scrape, corn, or callus on your foot and: ? You have pain, swelling, or redness that gets worse. ? You have fluid or blood coming from the wound, scrape, corn, or callus. ? Your wound, scrape, corn, or callus feels warm to the touch. ? You have pus or a bad smell coming from the wound, scrape, corn, or callus. ? You have a fever. ? You have a red line going up your leg. Summary  Check your feet every day for cuts, sores, red spots, swelling, and blisters.  Moisturize feet and legs daily.  Wear shoes that fit properly and have enough cushioning.  If you have foot problems, report any cuts, sores, or bruises to your health care provider immediately.  Schedule a complete foot exam at least once a year (annually) or more often if you have foot problems. This information is not intended to replace advice given to you by your health care provider. Make sure you discuss any questions you have with your health care provider. Document Released: 02/16/2000 Document Revised: 04/02/2017 Document Reviewed: 03/22/2016 Elsevier Patient Education  2020 [employer].

## 2018-12-02 NOTE — Progress Notes (Signed)
Subjective:    Patient ID: [name] Little, male    DOB: 22-Aug-1940, 78 y.o.   MRN: [id number]  DOS:  12/02/2018 Type of visit - description: Routine visit HTN: Good compliance with medication, ambulatory BP is normal Prostate nodule, L UTS: Symptoms improved. Allergies: Ongoing but slightly better with treatment. DM: On no medications, reports occasional numbness at his feet but they last few minutes only.  BP Readings from Last 3 Encounters:  12/02/18 (!) 169/99  04/23/18 132/76  02/10/18 122/76     Review of Systems Denies chest pain or difficulty breathing No nausea, vomiting, diarrhea Reports cold intolerance. No dysuria, gross hematuria or difficulty urinating  Past Medical History:  Diagnosis Date  . Allergic rhinitis   . Anxiety and depression   . Arthritis   . CAD (coronary artery disease)   . Diabetes mellitus   . GERD (gastroesophageal reflux disease)   . Headache(784.0) 06/2003   s/p neuro eval ? migraine  . Hyperlipidemia   . Hyperplastic colon polyp 2008  . Hypertension   . Iron deficiency    h/o  . Ischemic cardiomyopathy    EF had 30-35%. The most recent EF was 50% on echo in september 2009  . PUD (peptic ulcer disease) 7/11   EGD showed few small ulcers, esophagues strecthed  . Vision abnormalities     Past Surgical History:  Procedure Laterality Date  . CATARACT EXTRACTION Right 07-2012  . CORONARY ARTERY BYPASS GRAFT  2004   LIMA to the LADs, sequentail SVG to intermediated and obtuse marginal, sequential SVG to PDA and posterolaterla  . INGUINAL HERNIA REPAIR Bilateral   . TONSILLECTOMY      Social History   Socioeconomic History  . Marital status: Married    Spouse name: Not on file  . Number of children: 0  . Years of education: Not on file  . Highest education level: Not on file  Occupational History  . Occupation: retired Paramedic  . Financial resource strain: Not on file  . Food insecurity    Worry: Not on  file    Inability: Not on file  . Transportation needs    Medical: Not on file    Non-medical: Not on file  Tobacco Use  . Smoking status: Former Smoker    Types: Cigarettes    Quit date: 12/16/1976    Years since quitting: 41.9  . Smokeless tobacco: Never Used  Substance and Sexual Activity  . Alcohol use: No    Alcohol/week: 0.0 standard drinks  . Drug use: No  . Sexual activity: Not on file  Lifestyle  . Physical activity    Days per week: Not on file    Minutes per session: Not on file  . Stress: Not on file  Relationships  . Social Herbalist on phone: Not on file    Gets together: Not on file    Attends religious service: Not on file    Active member of club or organization: Not on file    Attends meetings of clubs or organizations: Not on file    Relationship status: Not on file  . Intimate partner violence    Fear of current or ex partner: Not on file    Emotionally abused: Not on file    Physically abused: Not on file    Forced sexual activity: Not on file  Other Topics Concern  . Not on file  Social History Narrative  Married , lives w/ wife , no children            Allergies as of 12/02/2018      Reactions   Micardis [telmisartan]    REACTION: diarrhea      Medication List       Accurate as of December 02, 2018 10:43 AM. If you have any questions, ask your nurse or doctor.        amLODipine 2.5 MG tablet Commonly known as: NORVASC Take 1 tablet (2.5 mg total) by mouth daily.   aspirin 81 MG chewable tablet 2 daily.   atorvastatin 40 MG tablet Commonly known as: LIPITOR Take 1 tablet (40 mg total) by mouth at bedtime.   azelastine 0.1 % nasal spray Commonly known as: ASTELIN Place 2 sprays into both nostrils at bedtime as needed for rhinitis.   CoQ10 400 MG Caps Take 1 capsule by mouth daily.   enalapril 10 MG tablet Commonly known as: VASOTEC TAKE 1 TABLET TWICE A DAY   fish oil-omega-3 fatty acids 1000 MG capsule  Take 1 g by mouth daily.   FLAX SEED OIL PO Take by mouth.   fluticasone 50 MCG/ACT nasal spray Commonly known as: FLONASE Place 2 sprays into both nostrils daily.   metoprolol tartrate 100 MG tablet Commonly known as: LOPRESSOR Take 1 tablet (100 mg total) by mouth 2 (two) times daily.   omeprazole 40 MG capsule Commonly known as: PRILOSEC Take 1 capsule (40 mg total) by mouth daily.   tamsulosin 0.4 MG Caps capsule Commonly known as: FLOMAX Take 1 capsule (0.4 mg total) by mouth daily after supper.   TYLENOL 8 HOUR PO Take 1 tablet by mouth as needed.           Objective:   Physical Exam BP (!) 169/99 (BP Location: Left Arm, Patient Position: Sitting, Cuff Size: Normal)   Pulse (!) 54   Temp (!) 95.9 F (35.5 C) (Temporal)   Resp 16   Ht 5\' 10"  (1.778 m)   Wt 206 lb 8 oz (93.7 kg)   SpO2 97%   BMI 29.63 kg/m  General:   Well developed, NAD, BMI noted. HEENT:  Normocephalic . Face symmetric, atraumatic Lungs:  CTA B Normal respiratory effort, no intercostal retractions, no accessory muscle use. Heart: RRR,  no murmur.  No pretibial edema bilaterally  Skin: Not pale. Not jaundice DM foot exam: Feet are warm, good pedal pulses, pinprick examination normal.  Nails need trimming. DRE: Prostate is not tender, slightly larger and more firm on the left?. I felt again a prostate nodule at the distal left side.  About 3 or 4 mm in size.  Not tender Neurologic:  alert & oriented X3.  Speech normal, gait appropriate for age and unassisted Psych--  Cognition and judgment appear intact.  Cooperative with normal attention span and concentration.  Behavior appropriate. No anxious or depressed appearing.      Assessment    Assessment  DM with neuropathy (Occasional numbness) HTN: rx lasix 01-2015, creat increased, d/c lasix Hyperlipidemia Anxiety, depression, insomnia Chronic fatigue Chronic renal insufficiency   CV:  --CAD -- carotid artery disease, US  done 10-2014 Hypogonadism DJD   Hematology:  -Iron deficiency: saw GI 02/2016, declined a GI workup. -Thrombocytopenia Neck mass, chronic saw ENT 12/2017 :DX chronic sialoadenitis, right submandibular salivary stone. Consider right submandibular gland removal.   PLAN:   DM: Diet controlled, check A1c, foot exam negative except for long nails, they are also dystrophic.  Counseled about nail care. HTN: BP slightly elevated but is typically normal at the office and  normal at home ("always less than 145") Check BMP, CBC.  Continue amlodipine, enalapril, metoprolol. Cold intolerance: Last TSH satisfactory.  Observation. Prostate nodule: Prostate exam today showing nodule on the left and slightly enlarged left side of the prostate (on the last DRE I documented the nodule on the right, dictation mistake?). Because the prostate is asymmetric we will recheck a PSA and refer to urology Allergies: See last visit, symptoms improved some. Preventive care: Had a flu shot already RTC 6 months, CPX

## 2018-12-02 NOTE — Progress Notes (Signed)
Pre visit review using our clinic review tool, if applicable. No additional management support is needed unless otherwise documented below in the visit note. 

## 2018-12-02 NOTE — Assessment & Plan Note (Signed)
DM: Diet controlled, check A1c, foot exam negative except for long nails, they are also dystrophic.  Counseled about nail care. HTN: BP slightly elevated but is typically normal at the office and  normal at home ("always less than 145") Check BMP, CBC.  Continue amlodipine, enalapril, metoprolol. Cold intolerance: Last TSH satisfactory.  Observation. Prostate nodule: Prostate exam today showing nodule on the left and slightly enlarged left side of the prostate (on the last DRE I documented the nodule on the right, dictation mistake?). Because the prostate is asymmetric we will recheck a PSA and refer to urology Allergies: See last visit, symptoms improved some. Preventive care: Had a flu shot already RTC 6 months, CPX

## 2018-12-04 ENCOUNTER — Other Ambulatory Visit: Payer: Self-pay | Admitting: Internal Medicine

## 2019-01-17 ENCOUNTER — Encounter: Payer: Self-pay | Admitting: Internal Medicine

## 2019-02-03 ENCOUNTER — Encounter: Payer: Self-pay | Admitting: Internal Medicine

## 2019-02-09 ENCOUNTER — Encounter: Payer: Self-pay | Admitting: Internal Medicine

## 2019-02-21 ENCOUNTER — Other Ambulatory Visit: Payer: Self-pay | Admitting: Internal Medicine

## 2019-03-02 ENCOUNTER — Encounter: Payer: Self-pay | Admitting: Internal Medicine

## 2019-03-08 ENCOUNTER — Encounter: Payer: Self-pay | Admitting: Internal Medicine

## 2019-03-08 ENCOUNTER — Other Ambulatory Visit: Payer: Self-pay | Admitting: Cardiology

## 2019-03-23 ENCOUNTER — Other Ambulatory Visit: Payer: Self-pay | Admitting: Internal Medicine

## 2019-03-29 ENCOUNTER — Other Ambulatory Visit: Payer: Self-pay | Admitting: Internal Medicine

## 2019-04-02 ENCOUNTER — Ambulatory Visit (INDEPENDENT_AMBULATORY_CARE_PROVIDER_SITE_OTHER): Payer: Medicare HMO | Admitting: Internal Medicine

## 2019-04-02 DIAGNOSIS — W19XXXA Unspecified fall, initial encounter: Secondary | ICD-10-CM | POA: Diagnosis not present

## 2019-04-02 DIAGNOSIS — S20211A Contusion of right front wall of thorax, initial encounter: Secondary | ICD-10-CM | POA: Diagnosis not present

## 2019-04-02 NOTE — Progress Notes (Signed)
Subjective:    Patient ID: [name] Little, male    DOB: 1940-05-15, 79 y.o.   MRN: [id number]  DOS:  04/02/2019 Type of visit - description: Virtual Visit via Telephone  Attempted  to make this a video visit, due to technical difficulties from the patient side it was not possible  thus we proceeded with a Virtual Visit via Telephone    I connected with above mentioned patient  by telephone and verified that I am speaking with the correct person using two identifiers.  THIS ENCOUNTER IS A VIRTUAL VISIT DUE TO COVID-19 - PATIENT WAS NOT SEEN IN THE OFFICE. PATIENT HAS CONSENTED TO VIRTUAL VISIT / TELEMEDICINE VISIT   Location of patient: home  Location of provider: office  I discussed the limitations, risks, security and privacy concerns of performing an evaluation and management service by telephone and the availability of in person appointments. I also discussed with the patient that there may be a patient responsible charge related to this service. The patient expressed understanding and agreed to proceed.  Acute This morning he was taking the garbage out and he is slipped on ice, fell on his right side, injured his right upper front chest. Complain of moderate to severe pain in that area.  Denies any neck or back injury. No head injury No LOC No shoulder injury. He is currently using a walker. Has taken Tylenol with some relief. There is no bruising, swelling or deformity at the chest wall.  Review of Systems See above   Past Medical History:  Diagnosis Date  . Allergic rhinitis   . Anxiety and depression   . Arthritis   . CAD (coronary artery disease)   . Diabetes mellitus   . GERD (gastroesophageal reflux disease)   . Headache(784.0) 06/2003   s/p neuro eval ? migraine  . Hyperlipidemia   . Hyperplastic colon polyp 2008  . Hypertension   . Iron deficiency    h/o  . Ischemic cardiomyopathy    EF had 30-35%. The most recent EF was 50% on echo in september  2009  . PUD (peptic ulcer disease) 7/11   EGD showed few small ulcers, esophagues strecthed  . Vision abnormalities     Past Surgical History:  Procedure Laterality Date  . CATARACT EXTRACTION Right 07-2012  . CORONARY ARTERY BYPASS GRAFT  2004   LIMA to the LADs, sequentail SVG to intermediated and obtuse marginal, sequential SVG to PDA and posterolaterla  . INGUINAL HERNIA REPAIR Bilateral   . TONSILLECTOMY          Objective:   Physical Exam There were no vitals taken for this visit. This is a virtual telephone visit, he is alert oriented x3, in no distress.  He does not sound like he is in pain.    Assessment     Assessment  DM with neuropathy (Occasional numbness) HTN: rx lasix 01-2015, creat increased, d/c lasix Hyperlipidemia Anxiety, depression, insomnia Chronic fatigue Chronic renal insufficiency   CV:  --CAD -- carotid artery disease, US done 10-2014 Hypogonadism DJD   Hematology:  -Iron deficiency: saw GI 02/2016, declined a GI workup. -Thrombocytopenia Neck mass, chronic saw ENT 12/2017 :DX chronic sialoadenitis, right submandibular salivary stone. Consider right submandibular gland removal.   PLAN:   Fall, chest wall injury. The patient had a fall today, has a right chest wall injury/contusion.  He knows the limitation of a virtual evaluation. He could have a fracture and an x-ray is indicated if he is  not improving gradually in the next few days. For now recommend the most important thing is prevent another fall, advised not to get out when it is icy, continue using a walker . Also use Tylenol 500 mg: 2 tablets as needed every 8 hours. He will let me know if this is not helping     I discussed the assessment and treatment plan with the patient. The patient was provided an opportunity to ask questions and all were answered. The patient agreed with the plan and demonstrated an understanding of the instructions.   The patient was advised to call back or  seek an in-person evaluation if the symptoms worsen or if the condition fails to improve as anticipated.  I provided 12 minutes of non-face-to-face time during this encounter.  Kathlene November, MD

## 2019-04-04 ENCOUNTER — Other Ambulatory Visit: Payer: Self-pay | Admitting: Internal Medicine

## 2019-04-04 NOTE — Assessment & Plan Note (Signed)
Fall, chest wall injury. The patient had a fall today, has a right chest wall injury/contusion.  He knows the limitation of a virtual evaluation. He could have a fracture and an x-ray is indicated if he is not improving gradually in the next few days. For now recommend the most important thing is prevent another fall, advised not to get out when it is icy, continue using a walker . Also use Tylenol 500 mg: 2 tablets as needed every 8 hours. He will let me know if this is not helping

## 2019-04-05 ENCOUNTER — Other Ambulatory Visit: Payer: Self-pay

## 2019-04-05 ENCOUNTER — Ambulatory Visit (INDEPENDENT_AMBULATORY_CARE_PROVIDER_SITE_OTHER): Payer: Medicare HMO | Admitting: Internal Medicine

## 2019-04-05 DIAGNOSIS — R0781 Pleurodynia: Secondary | ICD-10-CM | POA: Diagnosis not present

## 2019-04-05 DIAGNOSIS — W19XXXD Unspecified fall, subsequent encounter: Secondary | ICD-10-CM

## 2019-04-05 MED ORDER — HYDROCODONE-ACETAMINOPHEN 5-325 MG PO TABS: 1.0000 | ORAL_TABLET | Freq: Four times a day (QID) | ORAL | 0 refills | Status: DC | PRN

## 2019-04-05 NOTE — Progress Notes (Signed)
Subjective:    Patient ID: [name] Little, male    DOB: 09-Jan-1941, 79 y.o.   MRN: [id number]  DOS:  04/05/2019 Type of visit - description: Virtual Visit via Telephone  Attempted  to make this a video visit, due to technical difficulties from the patient side it was not possible  thus we proceeded with a Virtual Visit via Telephone    I connected with above mentioned patient  by telephone and verified that I am speaking with the correct person using two identifiers.  THIS ENCOUNTER IS A VIRTUAL VISIT DUE TO COVID-19 - PATIENT WAS NOT SEEN IN THE OFFICE. PATIENT HAS CONSENTED TO VIRTUAL VISIT / TELEMEDICINE VISIT   Location of patient: home  Location of provider: office  I discussed the limitations, risks, security and privacy concerns of performing an evaluation and management service by telephone and the availability of in person appointments. I also discussed with the patient that there may be a patient responsible charge related to this service. The patient expressed understanding and agreed to proceed.  Acute See previous visit, he had a fall 04/02/2019, since then he is having pain at the right side of the chest. He calls today because he is not any better, he is actually worse. To prevent the pain he has to just sit down, cannot move, as soon as he tries  to get up or move he has severe pain. He denies the chest wall to be TTP, he seems to be a pain inside. No pain of the thoracic spine.     Review of Systems No fever, occasional chills. No cough No headache or neck pain  Past Medical History:  Diagnosis Date  . Allergic rhinitis   . Anxiety and depression   . Arthritis   . CAD (coronary artery disease)   . Diabetes mellitus   . GERD (gastroesophageal reflux disease)   . Headache(784.0) 06/2003   s/p neuro eval ? migraine  . Hyperlipidemia   . Hyperplastic colon polyp 2008  . Hypertension   . Iron deficiency    h/o  . Ischemic cardiomyopathy    EF had  30-35%. The most recent EF was 50% on echo in september 2009  . PUD (peptic ulcer disease) 7/11   EGD showed few small ulcers, esophagues strecthed  . Vision abnormalities     Past Surgical History:  Procedure Laterality Date  . CATARACT EXTRACTION Right 07-2012  . CORONARY ARTERY BYPASS GRAFT  2004   LIMA to the LADs, sequentail SVG to intermediated and obtuse marginal, sequential SVG to PDA and posterolaterla  . INGUINAL HERNIA REPAIR Bilateral   . TONSILLECTOMY          Objective:   Physical Exam There were no vitals taken for this visit. This is a virtual telephone visit.  He sounded alert oriented x3 and in no distress.    Assessment     Assessment  DM with neuropathy (Occasional numbness) HTN: rx lasix 01-2015, creat increased, d/c lasix Hyperlipidemia Anxiety, depression, insomnia Chronic fatigue Chronic renal insufficiency   CV:  --CAD -- carotid artery disease, US done 10-2014 Hypogonadism DJD   Hematology:  -Iron deficiency: saw GI 02/2016, declined a GI workup. -Thrombocytopenia Neck mass, chronic saw ENT 12/2017 :DX chronic sialoadenitis, right submandibular salivary stone. Consider right submandibular gland removal.   PLAN:   Fall, chest wall injury. See last visit, no further falls but he continue with severe pain, increased with any sort of movement, still using a  walker. Requests something for pain because plain Tylenol is not working for him. Options discussed, we agreed on hydrocodone/acetaminophen. Side effects includes possible constipation and drowsiness which increased risk of falls.  He is aware of that and elected to try the medication, Rx sent.   Also I recommend x-ray to rule out a fracture, declined to do it today because he is in pain but states he hopefully will be able to do it tomorrow 04/07/2019. The   patient's wife will do the driving.   I discussed the assessment and treatment plan with the patient. The patient was provided an  opportunity to ask questions and all were answered. The patient agreed with the plan and demonstrated an understanding of the instructions.   The patient was advised to call back or seek an in-person evaluation if the symptoms worsen or if the condition fails to improve as anticipated.  I provided 20 minutes of non-face-to-face time during this encounter.  Kathlene November, MD

## 2019-04-06 NOTE — Assessment & Plan Note (Signed)
Fall, chest wall injury. See last visit, no further falls but he continue with severe pain, increased with any sort of movement, still using a walker. Requests something for pain because plain Tylenol is not working for him. Options discussed, we agreed on hydrocodone/acetaminophen. Side effects includes possible constipation and drowsiness which increased risk of falls.  He is aware of that and elected to try the medication, Rx sent.   Also I recommend x-ray to rule out a fracture, declined to do it today because he is in pain but states he hopefully will be able to do it tomorrow 04/07/2019. The   patient's wife will do the driving.

## 2019-04-08 ENCOUNTER — Other Ambulatory Visit: Payer: Self-pay

## 2019-04-08 ENCOUNTER — Telehealth: Payer: Self-pay | Admitting: Internal Medicine

## 2019-04-08 ENCOUNTER — Ambulatory Visit (HOSPITAL_BASED_OUTPATIENT_CLINIC_OR_DEPARTMENT_OTHER)
Admission: RE | Admit: 2019-04-08 | Discharge: 2019-04-08 | Disposition: A | Discharge status: Home / Self Care | Payer: Medicare HMO | Source: Ambulatory Visit | Attending: Internal Medicine | Admitting: Internal Medicine

## 2019-04-08 DIAGNOSIS — W19XXXD Unspecified fall, subsequent encounter: Secondary | ICD-10-CM | POA: Insufficient documentation

## 2019-04-08 DIAGNOSIS — J90 Pleural effusion, not elsewhere classified: Secondary | ICD-10-CM | POA: Insufficient documentation

## 2019-04-08 DIAGNOSIS — S2241XA Multiple fractures of ribs, right side, initial encounter for closed fracture: Secondary | ICD-10-CM | POA: Diagnosis not present

## 2019-04-08 DIAGNOSIS — R0781 Pleurodynia: Secondary | ICD-10-CM | POA: Diagnosis present

## 2019-04-08 NOTE — Telephone Encounter (Signed)
Please call patient, schedule a  follow-up appointment in 3 weeks  ============== The patient fell approximately 5 days ago, x-ray today show 7 rib fractures. I spoke to him:  he is "a lot better", no longer using a walker, able to go shopping today without major problems. Never use hydrocodone, Tylenol is sufficient. No fever chills. He would have met criteria for admission the day of the injury but at this point he is much improved. He reports that he is chest shape is normal even with deep breaths. Plan: Continue Tylenol Above with more falls, no heavy lifting Incentive spirometry w/ a balloon QID. ER if sudden onset of increased chest pain or difficulty breathing. Follow-up in 3 weeks. Discussed with the patient and his wife and they verbalized understanding.

## 2019-04-09 ENCOUNTER — Telehealth: Payer: Self-pay | Admitting: Internal Medicine

## 2019-04-09 DIAGNOSIS — S2241XA Multiple fractures of ribs, right side, initial encounter for closed fracture: Secondary | ICD-10-CM

## 2019-04-09 DIAGNOSIS — S2249XA Multiple fractures of ribs, unspecified side, initial encounter for closed fracture: Secondary | ICD-10-CM | POA: Insufficient documentation

## 2019-04-09 MED ORDER — NONFORMULARY OR COMPOUNDED ITEM: 0 refills | Status: DC

## 2019-04-09 NOTE — Telephone Encounter (Signed)
Spoke w/ Pt- informed I'd be emailing spirometer Rx to email provided shortly- PCP has to sign Rx. Pt verbalized understanding. 3 week f/u scheduled.

## 2019-04-09 NOTE — Telephone Encounter (Signed)
Pt called stating he needs a prescription for a spirometer. He would like it emailed to him at [email or web address] or fax to [facility]. Please let him know which method.

## 2019-04-13 ENCOUNTER — Telehealth: Payer: Self-pay | Admitting: Internal Medicine

## 2019-04-13 NOTE — Telephone Encounter (Signed)
[phone] 

## 2019-04-13 NOTE — Telephone Encounter (Signed)
Thank you, noted.

## 2019-04-13 NOTE — Telephone Encounter (Signed)
Pt states that he wanted to let Larose Kells know he was doing okay. Still have a lil swelling but he's doing okay

## 2019-04-13 NOTE — Telephone Encounter (Signed)
FYI

## 2019-04-26 ENCOUNTER — Encounter: Payer: Self-pay | Admitting: Internal Medicine

## 2019-04-26 ENCOUNTER — Ambulatory Visit (INDEPENDENT_AMBULATORY_CARE_PROVIDER_SITE_OTHER): Payer: Medicare HMO | Admitting: Internal Medicine

## 2019-04-26 ENCOUNTER — Other Ambulatory Visit: Payer: Self-pay

## 2019-04-26 ENCOUNTER — Ambulatory Visit (HOSPITAL_BASED_OUTPATIENT_CLINIC_OR_DEPARTMENT_OTHER)
Admission: RE | Admit: 2019-04-26 | Discharge: 2019-04-26 | Disposition: A | Discharge status: Home / Self Care | Payer: Medicare HMO | Source: Ambulatory Visit | Attending: Internal Medicine | Admitting: Internal Medicine

## 2019-04-26 VITALS — BP 158/87 | HR 76 | Temp 96.7°F | Resp 18 | Ht 70.0 in | Wt 203.2 lb

## 2019-04-26 DIAGNOSIS — S2241XD Multiple fractures of ribs, right side, subsequent encounter for fracture with routine healing: Secondary | ICD-10-CM

## 2019-04-26 DIAGNOSIS — R600 Localized edema: Secondary | ICD-10-CM | POA: Diagnosis not present

## 2019-04-26 DIAGNOSIS — I700 Atherosclerosis of aorta: Secondary | ICD-10-CM | POA: Diagnosis not present

## 2019-04-26 DIAGNOSIS — Z951 Presence of aortocoronary bypass graft: Secondary | ICD-10-CM | POA: Diagnosis not present

## 2019-04-26 DIAGNOSIS — I10 Essential (primary) hypertension: Secondary | ICD-10-CM

## 2019-04-26 MED ORDER — FUROSEMIDE 20 MG PO TABS: 20.0000 mg | ORAL_TABLET | Freq: Every day | ORAL | 1 refills | Status: DC

## 2019-04-26 NOTE — Patient Instructions (Addendum)
Please schedule Medicare Wellness with [name].    GO TO THE FRONT DESK Come back for blood work only in 10 days, please make an appointment   STOP BY THE FIRST FLOOR:  get the XR   Start furosemide 20 mg 1 tablet daily  Leg swelling: Leg elevation, try to be active at home  Check the  blood pressure 2 or 3 times a week BP GOAL is between 110/65 and  135/85. If it is consistently higher or lower, let me know

## 2019-04-26 NOTE — Progress Notes (Signed)
Subjective:    Patient ID: [name] Little, male    DOB: [birth year], 79 y.o.   MRN: [id number]  DOS:  04/26/2019 Type of visit - description: Follow-up Follow-up reference rib fractures. Overall feels better, less sore, still not able to sleep flat in bed. I notice his BP to be slightly elevated , also elevated at home.   Wt Readings from Last 3 Encounters:  04/26/19 203 lb 4 oz (92.2 kg)  12/02/18 206 lb 8 oz (93.7 kg)  04/23/18 200 lb 2 oz (90.8 kg)    Review of Systems Denies difficulty breathing or cough. Admits to some lower extremity edema  Past Medical History:  Diagnosis Date  . Allergic rhinitis   . Anxiety and depression   . Arthritis   . CAD (coronary artery disease)   . Diabetes mellitus   . GERD (gastroesophageal reflux disease)   . Headache(784.0) 06/2003   s/p neuro eval ? migraine  . Hyperlipidemia   . Hyperplastic colon polyp 2008  . Hypertension   . Iron deficiency    h/o  . Ischemic cardiomyopathy    EF had 30-35%. The most recent EF was 50% on echo in september 2009  . PUD (peptic ulcer disease) 7/11   EGD showed few small ulcers, esophagues strecthed  . Vision abnormalities     Past Surgical History:  Procedure Laterality Date  . CATARACT EXTRACTION Right 07-2012  . CORONARY ARTERY BYPASS GRAFT  2004   LIMA to the LADs, sequentail SVG to intermediated and obtuse marginal, sequential SVG to PDA and posterolaterla  . INGUINAL HERNIA REPAIR Bilateral   . TONSILLECTOMY      Allergies as of 04/26/2019      Reactions   Micardis [telmisartan]    REACTION: diarrhea      Medication List       Accurate as of April 26, 2019 11:59 PM. If you have any questions, ask your nurse or doctor.        amLODipine 2.5 MG tablet Commonly known as: NORVASC Take 1 tablet (2.5 mg total) by mouth daily.   aspirin 81 MG chewable tablet 2 daily.   atorvastatin 40 MG tablet Commonly known as: LIPITOR Take 1 tablet (40 mg total) by mouth at  bedtime.   azelastine 0.1 % nasal spray Commonly known as: ASTELIN Place 2 sprays into both nostrils at bedtime as needed for rhinitis.   CoQ10 400 MG Caps Take 1 capsule by mouth daily.   enalapril 10 MG tablet Commonly known as: VASOTEC TAKE 1 TABLET TWICE A DAY   fish oil-omega-3 fatty acids 1000 MG capsule Take 1 g by mouth daily.   FLAX SEED OIL PO Take by mouth.   fluticasone 50 MCG/ACT nasal spray Commonly known as: FLONASE Place 2 sprays into both nostrils daily.   furosemide 20 MG tablet Commonly known as: LASIX Take 1 tablet (20 mg total) by mouth daily. Started by: Kathlene November, MD   HYDROcodone-acetaminophen 5-325 MG tablet Commonly known as: NORCO/VICODIN Take 1 tablet by mouth every 6 (six) hours as needed.   metoprolol tartrate 100 MG tablet Commonly known as: LOPRESSOR Take 1 tablet (100 mg total) by mouth 2 (two) times daily.   NONFORMULARY OR COMPOUNDED ITEM Incentive Spirometer- Use at least four times daily- Dx:S22.49XA   omeprazole 40 MG capsule Commonly known as: PRILOSEC Take 1 capsule (40 mg total) by mouth daily.   tamsulosin 0.4 MG Caps capsule Commonly known as: FLOMAX Take 1 capsule (0.4  mg total) by mouth daily after supper.   TYLENOL 8 HOUR PO Take 1 tablet by mouth as needed.          Objective:   Physical Exam BP (!) 158/87 (BP Location: Left Arm, Patient Position: Sitting, Cuff Size: Normal)   Pulse 76   Temp (!) 96.7 F (35.9 C) (Temporal)   Resp 18   Ht 5\' 10"  (1.778 m)   Wt 203 lb 4 oz (92.2 kg)   SpO2 99%   BMI 29.16 kg/m   General:   Well developed, NAD, BMI noted. HEENT:  Normocephalic . Face symmetric, atraumatic Lungs:  CTA B Normal respiratory effort, no intercostal retractions, no accessory muscle use. Chest wall: Most of the pain is the right lateral wall near the armpit, there is no deformity, no TTP, no ecchymosis. Heart: RRR,  no murmur.  Lower extremities:  + /+++ edema R>L  Skin: Few blisters at  the right pretibial area Neurologic:  alert & oriented X3.  Speech normal, gait appropriate for age and unassisted Psych--  Cognition and judgment appear intact.  Cooperative with normal attention span and concentration.  Behavior appropriate. No anxious or depressed appearing.      Assessment     Assessment  DM with neuropathy (Occasional numbness) HTN: rx lasix 01-2015, creat increased, d/c lasix Hyperlipidemia Anxiety, depression, insomnia Chronic fatigue Chronic renal insufficiency   CV:  --CAD -- carotid artery disease, US done 10-2014 Hypogonadism DJD   Hematology:  -Iron deficiency: saw GI 02/2016, declined a GI workup. -Thrombocytopenia Neck mass, chronic saw ENT 12/2017 :DX chronic sialoadenitis, right submandibular salivary stone. Consider right submandibular gland removal.   PLAN:   Chest wall injury: Multiple fractures,  seems to be improving, had a pleural effusion on last x-ray, recheck a chest x-ray. HTN: Not well controlled.  Today is 158/87, at home he is getting similar readings. He is on amlodipine 2.5 mg qd (x a while), enalapril, metoprolol. Will add Lasix 20 mg daily, BMP in 10 days.  Monitor BPs. Leg  edema: New problem.  Noted on exam today, related to decreased mobility since he had a rib fracture?.  Encourage mobilization, prescribing Lasix, see above. RTC BMP in 10 days Follow-up 06/01/2019  This visit occurred during the SARS-CoV-2 public health emergency.  Safety protocols were in place, including screening questions prior to the visit, additional usage of staff PPE, and extensive cleaning of exam room while observing appropriate contact time as indicated for disinfecting solutions.

## 2019-04-26 NOTE — Progress Notes (Signed)
Pre visit review using our clinic review tool, if applicable. No additional management support is needed unless otherwise documented below in the visit note. 

## 2019-04-27 NOTE — Assessment & Plan Note (Addendum)
Chest wall injury: Multiple fractures,  seems to be improving, had a pleural effusion on last x-ray, recheck a chest x-ray. HTN: Not well controlled.  Today is 158/87, at home he is getting similar readings. He is on amlodipine 2.5 mg qd (x a while), enalapril, metoprolol. Will add Lasix 20 mg daily, BMP in 10 days.  Monitor BPs. Leg  edema: New problem.  Noted on exam today, related to decreased mobility since he had a rib fracture?.  Encourage mobilization, prescribing Lasix, see above. RTC BMP in 10 days Follow-up 06/01/2019

## 2019-05-06 ENCOUNTER — Other Ambulatory Visit (INDEPENDENT_AMBULATORY_CARE_PROVIDER_SITE_OTHER): Payer: Medicare HMO

## 2019-05-06 ENCOUNTER — Other Ambulatory Visit: Payer: Self-pay

## 2019-05-06 DIAGNOSIS — I10 Essential (primary) hypertension: Secondary | ICD-10-CM

## 2019-05-06 LAB — BASIC METABOLIC PANEL
BUN: 29 mg/dL — ABNORMAL HIGH (ref 6–23)
CO2: 31 mEq/L (ref 19–32)
Calcium: 9.5 mg/dL (ref 8.4–10.5)
Chloride: 100 mEq/L (ref 96–112)
Creatinine, Ser: 1.46 mg/dL (ref 0.40–1.50)
GFR: 46.52 mL/min — ABNORMAL LOW (ref >60.00)
Glucose, Bld: 96 mg/dL (ref 70–99)
Potassium: 4.1 mEq/L (ref 3.5–5.1)
Sodium: 138 mEq/L (ref 135–145)

## 2019-05-10 MED ORDER — FUROSEMIDE 20 MG PO TABS: 20.0000 mg | ORAL_TABLET | Freq: Every day | ORAL | 5 refills | Status: DC

## 2019-05-10 NOTE — Addendum Note (Signed)
Addended byDamita Dunnings D on: 05/10/2019 07:45 AM   Modules accepted: Orders

## 2019-05-24 ENCOUNTER — Other Ambulatory Visit: Payer: Self-pay

## 2019-05-24 MED ORDER — AMLODIPINE BESYLATE 2.5 MG PO TABS: 2.5000 mg | ORAL_TABLET | Freq: Every day | ORAL | 1 refills | Status: DC

## 2019-05-31 ENCOUNTER — Other Ambulatory Visit: Payer: Self-pay

## 2019-06-01 ENCOUNTER — Encounter: Payer: Self-pay | Admitting: Internal Medicine

## 2019-06-01 ENCOUNTER — Ambulatory Visit (INDEPENDENT_AMBULATORY_CARE_PROVIDER_SITE_OTHER): Payer: Medicare HMO | Admitting: Internal Medicine

## 2019-06-01 ENCOUNTER — Other Ambulatory Visit: Payer: Self-pay

## 2019-06-01 VITALS — BP 144/68 | HR 65 | Temp 97.4°F | Resp 18 | Ht 70.0 in | Wt 196.5 lb

## 2019-06-01 DIAGNOSIS — I10 Essential (primary) hypertension: Secondary | ICD-10-CM

## 2019-06-01 DIAGNOSIS — R634 Abnormal weight loss: Secondary | ICD-10-CM | POA: Diagnosis not present

## 2019-06-01 DIAGNOSIS — E1140 Type 2 diabetes mellitus with diabetic neuropathy, unspecified: Secondary | ICD-10-CM

## 2019-06-01 DIAGNOSIS — Z0000 Encounter for general adult medical examination without abnormal findings: Secondary | ICD-10-CM

## 2019-06-01 DIAGNOSIS — N402 Nodular prostate without lower urinary tract symptoms: Secondary | ICD-10-CM

## 2019-06-01 DIAGNOSIS — E785 Hyperlipidemia, unspecified: Secondary | ICD-10-CM

## 2019-06-01 LAB — COMPREHENSIVE METABOLIC PANEL
ALT: 21 U/L (ref 0–53)
AST: 18 U/L (ref 0–37)
Albumin: 4.3 g/dL (ref 3.5–5.2)
Alkaline Phosphatase: 94 U/L (ref 39–117)
BUN: 27 mg/dL — ABNORMAL HIGH (ref 6–23)
CO2: 30 mEq/L (ref 19–32)
Calcium: 9.5 mg/dL (ref 8.4–10.5)
Chloride: 97 mEq/L (ref 96–112)
Creatinine, Ser: 1.37 mg/dL (ref 0.40–1.50)
GFR: 50.05 mL/min — ABNORMAL LOW (ref >60.00)
Glucose, Bld: 128 mg/dL — ABNORMAL HIGH (ref 70–99)
Potassium: 4.2 mEq/L (ref 3.5–5.1)
Sodium: 137 mEq/L (ref 135–145)
Total Bilirubin: 1.2 mg/dL (ref 0.2–1.2)
Total Protein: 6.7 g/dL (ref 6.0–8.3)

## 2019-06-01 LAB — LIPID PANEL
Cholesterol: 142 mg/dL (ref 0–200)
HDL: 26.3 mg/dL — ABNORMAL LOW (ref >39.00)
LDL Cholesterol: 77 mg/dL (ref 0–99)
NonHDL: 115.37
Total CHOL/HDL Ratio: 5
Triglycerides: 190 mg/dL — ABNORMAL HIGH (ref 0.0–149.0)
VLDL: 38 mg/dL (ref 0.0–40.0)

## 2019-06-01 LAB — HEMOGLOBIN A1C: Hgb A1c MFr Bld: 6.8 % — ABNORMAL HIGH (ref 4.6–6.5)

## 2019-06-01 LAB — PSA: PSA: 0.88 ng/mL (ref 0.10–4.00)

## 2019-06-01 MED ORDER — FUROSEMIDE 20 MG PO TABS: 20.0000 mg | ORAL_TABLET | Freq: Every day | ORAL | 5 refills | Status: DC

## 2019-06-01 NOTE — Assessment & Plan Note (Signed)
Here for CPX DM: On Vasotec, otherwise diet controlled, check A1c HTN: Currently on amlodipine, Vasotec, Lasix, metoprolol.   Lasix was added at the last visit  D/t edema, f/u BMP was satisfactory,edema resolved. BP was slightly elevated today upon arrival, recheck 144/68.  No change, check a CMP Hyperlipidemia: On Lipitor, check FLP Chest wall injury, multiple fractures: Gradually improving, reports he never tried hydrocodone. Prostate nodule: Today I repeated the prostate exam, left side lobule seems slightly enlarged but not worse.  Previously felt left prostate nodule is almost resolved, he does have new, small, midline nodule.  Not tender.  Was referred to urology before but referral was not pursued.  Last PSA below 1.0. With such low PSA, chances for malignancy are very low.  We will recheck PSA and continue monitoring for now. 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  We agreed on observation.    RTC 4 months

## 2019-06-01 NOTE — Progress Notes (Signed)
Pre visit review using our clinic review tool, if applicable. No additional management support is needed unless otherwise documented below in the visit note. 

## 2019-06-01 NOTE — Progress Notes (Signed)
Subjective:    Patient ID: [name] Little, male    DOB: April 10, 1940, 79 y.o.   MRN: [id number]  DOS:  06/01/2019 Type of visit - description: CPX In addition to CPX we also talk about other issues. Recent chest wall injury, rib fractures: Pain decreased but not completely gone Lower extremity edema: Gone, on Lasix daily Prostate nodule, has not seen urology, L UTS much improved.  Wt Readings from Last 3 Encounters:  06/01/19 196 lb 8 oz (89.1 kg)  04/26/19 203 lb 4 oz (92.2 kg)  12/02/18 206 lb 8 oz (93.7 kg)     Review of Systems Denies depression or anxiety No blood in the urine  Other than above, a 14 point review of systems is negative     Past Medical History:  Diagnosis Date  . Allergic rhinitis   . Anxiety and depression   . Arthritis   . CAD (coronary artery disease)   . Diabetes mellitus   . GERD (gastroesophageal reflux disease)   . Headache(784.0) 06/2003   s/p neuro eval ? migraine  . Hyperlipidemia   . Hyperplastic colon polyp 2008  . Hypertension   . Iron deficiency    h/o  . Ischemic cardiomyopathy    EF had 30-35%. The most recent EF was 50% on echo in september 2009  . PUD (peptic ulcer disease) 7/11   EGD showed few small ulcers, esophagues strecthed  . Vision abnormalities     Past Surgical History:  Procedure Laterality Date  . CATARACT EXTRACTION Right 07-2012  . CORONARY ARTERY BYPASS GRAFT  2004   LIMA to the LADs, sequentail SVG to intermediated and obtuse marginal, sequential SVG to PDA and posterolaterla  . INGUINAL HERNIA REPAIR Bilateral   . TONSILLECTOMY     Family History  Problem Relation Age of Onset  . Diabetes Father   . Heart attack Father   . Heart attack Mother   . Cancer Paternal Grandfather        type unknown  . Colon cancer Neg Hx   . Prostate cancer Neg Hx     Allergies as of 06/01/2019      Reactions   Micardis [telmisartan]    REACTION: diarrhea      Medication List       Accurate as of June 01, 2019  9:00 PM. If you have any questions, ask your nurse or doctor.        STOP taking these medications   HYDROcodone-acetaminophen 5-325 MG tablet Commonly known as: NORCO/VICODIN Stopped by: Kathlene November, MD     TAKE these medications   amLODipine 2.5 MG tablet Commonly known as: NORVASC Take 1 tablet (2.5 mg total) by mouth daily.   aspirin 81 MG chewable tablet 2 daily.   atorvastatin 40 MG tablet Commonly known as: LIPITOR Take 1 tablet (40 mg total) by mouth at bedtime.   azelastine 0.1 % nasal spray Commonly known as: ASTELIN Place 2 sprays into both nostrils at bedtime as needed for rhinitis.   CoQ10 400 MG Caps Take 1 capsule by mouth daily.   enalapril 10 MG tablet Commonly known as: VASOTEC TAKE 1 TABLET TWICE A DAY   fish oil-omega-3 fatty acids 1000 MG capsule Take 1 g by mouth daily.   FLAX SEED OIL PO Take by mouth.   fluticasone 50 MCG/ACT nasal spray Commonly known as: FLONASE Place 2 sprays into both nostrils daily.   furosemide 20 MG tablet Commonly known as: LASIX Take  1 tablet (20 mg total) by mouth daily.   metoprolol tartrate 100 MG tablet Commonly known as: LOPRESSOR Take 1 tablet (100 mg total) by mouth 2 (two) times daily.   NONFORMULARY OR COMPOUNDED ITEM Incentive Spirometer- Use at least four times daily- Dx:S22.49XA   omeprazole 40 MG capsule Commonly known as: PRILOSEC Take 1 capsule (40 mg total) by mouth daily.   tamsulosin 0.4 MG Caps capsule Commonly known as: FLOMAX Take 1 capsule (0.4 mg total) by mouth daily after supper.   TYLENOL 8 HOUR PO Take 1 tablet by mouth as needed.          Objective:   Physical Exam BP (!) 144/68   Pulse 65   Temp (!) 97.4 F (36.3 C) (Temporal)   Resp 18   Ht 5\' 10"  (1.778 m)   Wt 196 lb 8 oz (89.1 kg)   SpO2 100%   BMI 28.19 kg/m  General: Well developed, NAD, BMI noted Neck: No  thyromegaly  HEENT:  Normocephalic . Face symmetric, atraumatic Lungs:  CTA  B Normal respiratory effort, no intercostal retractions, no accessory muscle use. Heart: RRR,  no murmur.  Abdomen:  Not distended, soft, non-tender. No rebound or rigidity.   Lower extremities: no pretibial edema bilaterally  Skin: Pretibial skin very dry, thin, slightly scaly but no warmth to touch. DRE: Prostate is not tender, minimally larger and more firm on the left?. Previously felt left-sided nodule is now almost resolved. He did have a 3 mm nodule at midline, proximally, nontender. Neurologic:  alert & oriented X3.  Speech normal, gait appropriate for age and unassisted Strength symmetric and appropriate for age.  Psych: Cognition and judgment appear intact.  Cooperative with normal attention span and concentration.  Behavior appropriate. No anxious or depressed appearing.     Assessment     Assessment  DM with neuropathy (Occasional numbness) HTN: rx lasix 01-2015, creat increased, d/c lasix Hyperlipidemia Anxiety, depression, insomnia Chronic fatigue Chronic renal insufficiency   CV:  --CAD -- carotid artery disease, US done 10-2014 Hypogonadism DJD   Hematology:  -Iron deficiency: saw GI 02/2016, declined a GI workup. -Thrombocytopenia Neck mass, chronic saw ENT 12/2017 :DX chronic sialoadenitis, right submandibular salivary stone. Consider right submandibular gland removal. GU: L UTS: Rx tamsulosin 04-2018  PLAN:   Here for CPX DM: On Vasotec, otherwise diet controlled, check A1c HTN: Currently on amlodipine, Vasotec, Lasix, metoprolol.   Lasix was added at the last visit  D/t edema, f/u BMP was satisfactory,edema resolved. BP was slightly elevated today upon arrival, recheck 144/68.  No change, check a CMP Hyperlipidemia: On Lipitor, check FLP Chest wall injury, multiple fractures: Gradually improving, reports he never tried hydrocodone. Prostate nodule: Today I repeated the prostate exam, left side lobule seems slightly enlarged but not worse.   Previously felt left prostate nodule is almost resolved, he does have new, small, midline nodule.  Not tender.  Was referred to urology before but referral was not pursued.  Last PSA below 1.0. With such low PSA, chances for malignancy are very low.  We will recheck PSA and continue monitoring for now. 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  We agreed on observation.    RTC 4 months  In addition to CPX, more than 20 minutes were spent on other medical problems.  This visit occurred during the SARS-CoV-2 public health emergency.  Safety protocols were  in place, including screening questions prior to the visit, additional usage of staff PPE, and extensive cleaning of exam room while observing appropriate contact time as indicated for disinfecting solutions.

## 2019-06-01 NOTE — Assessment & Plan Note (Signed)
-  Td 2014 ; pneumonia shot  2013 ; prevnar 2015; zostavax 2016, shingrex discussed before: not interested Covid: Had 2 shots -CCS:  colonoscopy 03/2006, 2 hyperplastic polyps.  Has iron deficiency, saw GI 02/2016 and declined GI workup -Prostate cancer screening: see comments under prostate nodule -Labs:  See above -Diet exercise: Discussed

## 2019-06-01 NOTE — Patient Instructions (Addendum)
Please schedule Medicare Wellness with [name].   Continue checking your blood pressures BP GOAL is between 110/65 and  135/85. If it is consistently higher or lower, let me know    GO TO THE LAB : Get the blood work     [place], please reschedule your appointments Come back for a checkup in 4 months

## 2019-06-28 DIAGNOSIS — H1132 Conjunctival hemorrhage, left eye: Secondary | ICD-10-CM | POA: Diagnosis not present

## 2019-07-07 DIAGNOSIS — R69 Illness, unspecified: Secondary | ICD-10-CM | POA: Diagnosis not present

## 2019-08-10 DIAGNOSIS — H5203 Hypermetropia, bilateral: Secondary | ICD-10-CM | POA: Diagnosis not present

## 2019-08-10 LAB — HM DIABETES EYE EXAM

## 2019-08-13 ENCOUNTER — Other Ambulatory Visit: Payer: Self-pay | Admitting: Internal Medicine

## 2019-08-19 ENCOUNTER — Encounter: Payer: Self-pay | Admitting: Internal Medicine

## 2019-09-18 ENCOUNTER — Other Ambulatory Visit: Payer: Self-pay | Admitting: Internal Medicine

## 2019-09-21 ENCOUNTER — Telehealth: Payer: Self-pay | Admitting: Internal Medicine

## 2019-09-21 NOTE — Telephone Encounter (Signed)
Will need to call downstairs to have these placed on CD. Their number is [phone].

## 2019-09-21 NOTE — Telephone Encounter (Signed)
I called him and left a voicemail with that number for medical records.

## 2019-09-21 NOTE — Telephone Encounter (Signed)
Patient needs copies of Xrays and regarding his fall and rib injuries starting in February, 2021. His insurance needs these for claims.

## 2019-10-01 ENCOUNTER — Ambulatory Visit: Payer: Medicare HMO | Admitting: Internal Medicine

## 2019-10-13 ENCOUNTER — Encounter: Payer: Self-pay | Admitting: Internal Medicine

## 2019-10-13 DIAGNOSIS — R69 Illness, unspecified: Secondary | ICD-10-CM | POA: Diagnosis not present

## 2019-10-15 ENCOUNTER — Encounter: Payer: Self-pay | Admitting: Internal Medicine

## 2019-10-21 ENCOUNTER — Other Ambulatory Visit: Payer: Self-pay

## 2019-10-21 ENCOUNTER — Encounter: Payer: Self-pay | Admitting: Internal Medicine

## 2019-10-21 ENCOUNTER — Ambulatory Visit (INDEPENDENT_AMBULATORY_CARE_PROVIDER_SITE_OTHER): Payer: Medicare HMO | Admitting: Internal Medicine

## 2019-10-21 VITALS — BP 138/64 | HR 67 | Temp 97.9°F | Resp 16 | Ht 70.0 in | Wt 191.1 lb

## 2019-10-21 DIAGNOSIS — I10 Essential (primary) hypertension: Secondary | ICD-10-CM

## 2019-10-21 DIAGNOSIS — I2510 Atherosclerotic heart disease of native coronary artery without angina pectoris: Secondary | ICD-10-CM | POA: Diagnosis not present

## 2019-10-21 DIAGNOSIS — E1140 Type 2 diabetes mellitus with diabetic neuropathy, unspecified: Secondary | ICD-10-CM | POA: Diagnosis not present

## 2019-10-21 DIAGNOSIS — R634 Abnormal weight loss: Secondary | ICD-10-CM | POA: Diagnosis not present

## 2019-10-21 LAB — BASIC METABOLIC PANEL
BUN: 27 mg/dL — ABNORMAL HIGH (ref 6–23)
CO2: 30 mEq/L (ref 19–32)
Calcium: 9.5 mg/dL (ref 8.4–10.5)
Chloride: 99 mEq/L (ref 96–112)
Creatinine, Ser: 1.51 mg/dL — ABNORMAL HIGH (ref 0.40–1.50)
GFR: 44.69 mL/min — ABNORMAL LOW (ref >60.00)
Glucose, Bld: 164 mg/dL — ABNORMAL HIGH (ref 70–99)
Potassium: 4.3 mEq/L (ref 3.5–5.1)
Sodium: 139 mEq/L (ref 135–145)

## 2019-10-21 LAB — CBC WITH DIFFERENTIAL/PLATELET
Basophils Absolute: 0.1 10*3/uL (ref 0.0–0.1)
Basophils Relative: 0.7 % (ref 0.0–3.0)
Eosinophils Absolute: 1.9 10*3/uL — ABNORMAL HIGH (ref 0.0–0.7)
Eosinophils Relative: 24.5 % — ABNORMAL HIGH (ref 0.0–5.0)
HCT: 48.5 % (ref 39.0–52.0)
Hemoglobin: 16.3 g/dL (ref 13.0–17.0)
Lymphocytes Relative: 20 % (ref 12.0–46.0)
Lymphs Abs: 1.6 10*3/uL (ref 0.7–4.0)
MCHC: 33.5 g/dL (ref 30.0–36.0)
MCV: 96.2 fl (ref 78.0–100.0)
Monocytes Absolute: 0.4 10*3/uL (ref 0.1–1.0)
Monocytes Relative: 5.3 % (ref 3.0–12.0)
Neutro Abs: 3.9 10*3/uL (ref 1.4–7.7)
Neutrophils Relative %: 49.5 % (ref 43.0–77.0)
Platelets: 110 10*3/uL — ABNORMAL LOW (ref 150.0–400.0)
RBC: 5.04 Mil/uL (ref 4.22–5.81)
RDW: 13.1 % (ref 11.5–15.5)
WBC: 7.8 10*3/uL (ref 4.0–10.5)

## 2019-10-21 LAB — TSH: TSH: 0.5 u[IU]/mL (ref 0.35–4.50)

## 2019-10-21 LAB — HEMOGLOBIN A1C: Hgb A1c MFr Bld: 6.6 % — ABNORMAL HIGH (ref 4.6–6.5)

## 2019-10-21 LAB — MAGNESIUM: Magnesium: 1.6 mg/dL (ref 1.5–2.5)

## 2019-10-21 NOTE — Progress Notes (Signed)
Pre visit review using our clinic review tool, if applicable. No additional management support is needed unless otherwise documented below in the visit note. 

## 2019-10-21 NOTE — Progress Notes (Signed)
Subjective:    Patient ID: [name] Little, male    DOB: February 04, 1941, 79 y.o.   MRN: [id number]  DOS:  10/21/2019 Type of visit - description: Follow-up Since the last office visit he is actually feeling great. Has noted again mild weight loss.  He is no doing anything different.  Wt Readings from Last 3 Encounters:  10/21/19 191 lb 2 oz (86.7 kg)  06/01/19 196 lb 8 oz (89.1 kg)  04/26/19 203 lb 4 oz (92.2 kg)     Review of Systems Denies any fever chills. No abdominal pain no blood in the stools. No cough No chest pain  Past Medical History:  Diagnosis Date  . Allergic rhinitis   . Anxiety and depression   . Arthritis   . CAD (coronary artery disease)   . Diabetes mellitus   . GERD (gastroesophageal reflux disease)   . Headache(784.0) 06/2003   s/p neuro eval ? migraine  . Hyperlipidemia   . Hyperplastic colon polyp 2008  . Hypertension   . Iron deficiency    h/o  . Ischemic cardiomyopathy    EF had 30-35%. The most recent EF was 50% on echo in september 2009  . PUD (peptic ulcer disease) 7/11   EGD showed few small ulcers, esophagues strecthed  . Vision abnormalities     Past Surgical History:  Procedure Laterality Date  . CATARACT EXTRACTION Right 07-2012  . CORONARY ARTERY BYPASS GRAFT  2004   LIMA to the LADs, sequentail SVG to intermediated and obtuse marginal, sequential SVG to PDA and posterolaterla  . INGUINAL HERNIA REPAIR Bilateral   . TONSILLECTOMY      Allergies as of 10/21/2019      Reactions   Micardis [telmisartan]    REACTION: diarrhea      Medication List       Accurate as of October 21, 2019 11:59 PM. If you have any questions, ask your nurse or doctor.        amLODipine 2.5 MG tablet Commonly known as: NORVASC TAKE 1 TABLET DAILY   aspirin 81 MG chewable tablet 2 daily.   atorvastatin 40 MG tablet Commonly known as: LIPITOR Take 1 tablet (40 mg total) by mouth at bedtime.   azelastine 0.1 % nasal spray Commonly known  as: ASTELIN Place 2 sprays into both nostrils at bedtime as needed for rhinitis.   CoQ10 400 MG Caps Take 1 capsule by mouth daily.   enalapril 10 MG tablet Commonly known as: VASOTEC TAKE 1 TABLET TWICE A DAY   fish oil-omega-3 fatty acids 1000 MG capsule Take 1 g by mouth daily.   FLAX SEED OIL PO Take by mouth.   fluticasone 50 MCG/ACT nasal spray Commonly known as: FLONASE Place 2 sprays into both nostrils daily.   furosemide 20 MG tablet Commonly known as: LASIX Take 1 tablet (20 mg total) by mouth daily.   metoprolol tartrate 100 MG tablet Commonly known as: LOPRESSOR Take 1 tablet (100 mg total) by mouth 2 (two) times daily.   NONFORMULARY OR COMPOUNDED ITEM Incentive Spirometer- Use at least four times daily- Dx:S22.49XA   omeprazole 40 MG capsule Commonly known as: PRILOSEC Take 1 capsule (40 mg total) by mouth daily.   tamsulosin 0.4 MG Caps capsule Commonly known as: FLOMAX Take 1 capsule (0.4 mg total) by mouth daily after supper.   TYLENOL 8 HOUR PO Take 1 tablet by mouth as needed.          Objective:  Physical Exam BP 138/64 (BP Location: Left Arm, Patient Position: Sitting, Cuff Size: Small)   Pulse 67   Temp 97.9 F (36.6 C) (Oral)   Resp 16   Ht 5\' 10"  (1.778 m)   Wt 191 lb 2 oz (86.7 kg)   SpO2 99%   BMI 27.42 kg/m  General:   Well developed, NAD, BMI noted. HEENT:  Normocephalic . Face symmetric, atraumatic Lungs:  CTA B Normal respiratory effort, no intercostal retractions, no accessory muscle use. Heart: Bradycardic, regular?,  no murmur.  Lower extremities: no pretibial edema bilaterally  Skin: Not pale. Not jaundice Neurologic:  alert & oriented X3.  Speech normal, gait appropriate for age and unassisted Psych--  Cognition and judgment appear intact.  Cooperative with normal attention span and concentration.  Behavior appropriate. No anxious or depressed appearing.      Assessment     Assessment  DM with  neuropathy (Occasional numbness) HTN: rx lasix 01-2015, creat increased, d/c lasix Hyperlipidemia Anxiety, depression, insomnia Chronic fatigue Chronic renal insufficiency   CV:  --CAD -- carotid artery disease, US done 10-2014 Hypogonadism DJD   Hematology:  -Iron deficiency: saw GI 02/2016, declined a GI workup. -Thrombocytopenia Neck mass, chronic saw ENT 12/2017 :DX chronic sialoadenitis, right submandibular salivary stone. Consider right submandibular gland removal. GU: L UTS: Rx tamsulosin 04-2018  PLAN:   DM: Diet controlled, last A1c 6.8, recheck. HTN: Seems controlled, continue amlodipine, Vasotec, Lasix, metoprolol.  BMP, CBC. High cholesterol: Last LDL 77, on Lipitor. CAD: Asymptomatic, EKG today: Sinus rhythm, frequent ventricular ectopy.  Arrange a cardiology referral for routine follow-up. Check a magnesium level Prostate nodule: See last visit, last PSA 0.8. Weight loss: Has lost some more weight, he is asymptomatic.  From Lasix?Marland Kitchen  Check TSH, otherwise observation Encourage flu shot this fall. RTC 4 months    This visit occurred during the SARS-CoV-2 public health emergency.  Safety protocols were in place, including screening questions prior to the visit, additional usage of staff PPE, and extensive cleaning of exam room while observing appropriate contact time as indicated for disinfecting solutions.

## 2019-10-21 NOTE — Patient Instructions (Addendum)
Please schedule Medicare Wellness with [name].    Check the  blood pressure regularly BP GOAL is between 110/65 and  135/85. If it is consistently higher or lower, let me know    GO TO THE LAB : Get the blood work     [name] Little, [place] back for a checkup in 4 months

## 2019-10-22 ENCOUNTER — Encounter: Payer: Self-pay | Admitting: Internal Medicine

## 2019-10-22 NOTE — Assessment & Plan Note (Signed)
DM: Diet controlled, last A1c 6.8, recheck. HTN: Seems controlled, continue amlodipine, Vasotec, Lasix, metoprolol.  BMP, CBC. High cholesterol: Last LDL 77, on Lipitor. CAD: Asymptomatic, EKG today: Sinus rhythm, frequent ventricular ectopy.  Arrange a cardiology referral for routine follow-up. Check a magnesium level Prostate nodule: See last visit, last PSA 0.8. Weight loss: Has lost some more weight, he is asymptomatic.  From Lasix?Marland Kitchen  Check TSH, otherwise observation Encourage flu shot this fall. RTC 4 months

## 2019-10-28 ENCOUNTER — Telehealth: Payer: Self-pay | Admitting: Internal Medicine

## 2019-10-28 NOTE — Progress Notes (Signed)
  Chronic Care Management   Outreach Note  10/28/2019 Name: [name] Little MRN: [id number] DOB: 1940/11/14  Referred by: Colon Branch, MD Reason for referral : No chief complaint on file.   An unsuccessful telephone outreach was attempted today. The patient was referred to the pharmacist for assistance with care management and care coordination.   Follow Up Plan:   Carley Perdue [employer] Scheduler

## 2019-10-28 NOTE — Telephone Encounter (Signed)
New Message:   [name] Little is returning your call to r/s his pharmacist appt. Please advise.

## 2019-10-28 NOTE — Progress Notes (Signed)
  Chronic Care Management   Note  10/28/2019 Name: ANTONE SUMMONS MRN: [id number] DOB: 1940-05-15  Mammie Lorenzo III is a 79 y.o. year old male who is a primary care patient of Colon Branch, MD. I reached out to Mammie Lorenzo III by phone today in response to a referral sent by Mr. ORLAND VISCONTI III's PCP, Colon Branch, MD.   Mr. Grealish was given information about Chronic Care Management services today including:  1. CCM service includes personalized support from designated clinical staff supervised by his physician, including individualized plan of care and coordination with other care providers 2. 24/7 contact phone numbers for assistance for urgent and routine care needs. 3. Service will only be billed when office clinical staff spend 20 minutes or more in a month to coordinate care. 4. Only one practitioner may furnish and bill the service in a calendar month. 5. The patient may stop CCM services at any time (effective at the end of the month) by phone call to the office staff.   Patient agreed to services and verbal consent obtained.   Follow up plan:   Carley Perdue [employer] Scheduler

## 2019-11-29 ENCOUNTER — Other Ambulatory Visit: Payer: Self-pay

## 2019-11-29 DIAGNOSIS — E1140 Type 2 diabetes mellitus with diabetic neuropathy, unspecified: Secondary | ICD-10-CM

## 2019-11-29 DIAGNOSIS — I10 Essential (primary) hypertension: Secondary | ICD-10-CM

## 2019-12-02 DIAGNOSIS — Z7189 Other specified counseling: Secondary | ICD-10-CM | POA: Insufficient documentation

## 2019-12-02 DIAGNOSIS — M7989 Other specified soft tissue disorders: Secondary | ICD-10-CM | POA: Insufficient documentation

## 2019-12-02 NOTE — Progress Notes (Signed)
Cardiology Office Note   Date:  12/03/2019   ID:  [name] Little Jun 28, 1940, MRN [id number]  PCP:  Colon Branch, MD  Cardiologist:   [name], MD   Chief Complaint  Patient presents with  . Foot Pain      History of Present Illness: [name] Little is a 79 y.o. male who presents for one year follow up of CAD.  Since I last saw him he has had no new cardiovascular problems.  He fell and broke his ribs earlier this year slipping on some black ice.  However, after recovering from that he has done well with yard work.  He does not have chest discomfort, neck or arm discomfort.  He does not have shortness of breath, PND or orthopnea.  He has no palpitations, presyncope or syncope.  His biggest problem is discoloration in his feet and some burning discomfort in his toes.   Past Medical History:  Diagnosis Date  . Allergic rhinitis   . Anxiety and depression   . Arthritis   . CAD (coronary artery disease)   . Diabetes mellitus   . GERD (gastroesophageal reflux disease)   . Headache(784.0) 06/2003   s/p neuro eval ? migraine  . Hyperlipidemia   . Hyperplastic colon polyp 2008  . Hypertension   . Iron deficiency    h/o  . Ischemic cardiomyopathy    EF had 30-35%. The most recent EF was 50% on echo in september 2009  . PUD (peptic ulcer disease) 7/11   EGD showed few small ulcers, esophagues strecthed  . Vision abnormalities     Past Surgical History:  Procedure Laterality Date  . CATARACT EXTRACTION Right 07-2012  . CORONARY ARTERY BYPASS GRAFT  2004   LIMA to the LADs, sequentail SVG to intermediated and obtuse marginal, sequential SVG to PDA and posterolaterla  . INGUINAL HERNIA REPAIR Bilateral   . TONSILLECTOMY       Current Outpatient Medications  Medication Sig Dispense Refill  . Acetaminophen (TYLENOL 8 HOUR PO) Take 1 tablet by mouth as needed.    Marland Kitchen amLODipine (NORVASC) 2.5 MG tablet TAKE 1 TABLET DAILY 90 tablet 1  . aspirin 81 MG  chewable tablet 2 daily.     Marland Kitchen atorvastatin (LIPITOR) 40 MG tablet Take 1 tablet (40 mg total) by mouth at bedtime. 90 tablet 3  . azelastine (ASTELIN) 0.1 % nasal spray Place 2 sprays into both nostrils at bedtime as needed for rhinitis. 30 mL 12  . Coenzyme Q10 (COQ10) 400 MG CAPS Take 1 capsule by mouth daily.    . enalapril (VASOTEC) 10 MG tablet TAKE 1 TABLET TWICE A DAY 180 tablet 3  . fish oil-omega-3 fatty acids 1000 MG capsule Take 1 g by mouth daily.     . Flaxseed, Linseed, (FLAX SEED OIL PO) Take by mouth.     . fluticasone (FLONASE) 50 MCG/ACT nasal spray Place 2 sprays into both nostrils daily.  6  . furosemide (LASIX) 20 MG tablet Take 1 tablet (20 mg total) by mouth daily. 30 tablet 5  . metoprolol tartrate (LOPRESSOR) 100 MG tablet Take 1 tablet (100 mg total) by mouth 2 (two) times daily. 180 tablet 1  . omeprazole (PRILOSEC) 40 MG capsule Take 1 capsule (40 mg total) by mouth daily. 90 capsule 3  . tamsulosin (FLOMAX) 0.4 MG CAPS capsule Take 1 capsule (0.4 mg total) by mouth daily after supper. 90 capsule 2   No current  facility-administered medications for this visit.    Allergies:   Micardis [telmisartan]    ROS:  Please see the history of present illness.   Otherwise, review of systems are positive for none.   All other systems are reviewed and negative.    PHYSICAL EXAM: VS:  BP (!) 148/72   Pulse (!) 57   Ht 5\' 10"  (1.778 m)   Wt 192 lb 6.4 oz (87.3 kg)   BMI 27.61 kg/m  , BMI Body mass index is 27.61 kg/m. GENERAL:  Well appearing NECK:  No jugular venous distention, waveform within normal limits, carotid upstroke brisk and symmetric, no bruits, no thyromegaly LUNGS:  Clear to auscultation bilaterally CHEST:  Well healed sternotomy scar. HEART:  PMI not displaced or sustained,S1 and S2 within normal limits, no S3, no S4, no clicks, no rubs, no murmurs ABD:  Flat, positive bowel sounds normal in frequency in pitch, no bruits, no rebound, no guarding, no  midline pulsatile mass, no hepatomegaly, no splenomegaly EXT:  2 plus pulses upper, 2+ posterior tibialis bilaterally, absent dorsalis pedis on the right, no edema, no cyanosis no clubbing   EKG:  EKG is ordered today. The ekg ordered today demonstrates sinus rhythm, rate 57, axis within normal limits, old anterior infarct, old anterior infarct, no change from previous   Recent Labs: 06/01/2019: ALT 21 10/21/2019: BUN 27; Creatinine, Ser 1.51; Hemoglobin 16.3; Magnesium 1.6; Platelets 110.0; Potassium 4.3; Sodium 139; TSH 0.50    Lipid Panel    Component Value Date/Time   CHOL 142 06/01/2019 0942   TRIG 190.0 (H) 06/01/2019 0942   HDL 26.30 (L) 06/01/2019 0942   CHOLHDL 5 06/01/2019 0942   VLDL 38.0 06/01/2019 0942   LDLCALC 77 06/01/2019 0942      Wt Readings from Last 3 Encounters:  12/03/19 192 lb 6.4 oz (87.3 kg)  10/21/19 191 lb 2 oz (86.7 kg)  06/01/19 196 lb 8 oz (89.1 kg)      Other studies Reviewed: Additional studies/ records that were reviewed today include: Labs. Review of the above records demonstrates:  Please see elsewhere in the note.     ASSESSMENT AND PLAN:  CAD -  The patient has no new chest discomfort.  We will continue with risk reduction.  No further testing is indicated.  CARDIOMYOPATHY - He had a mildy reduced EF in 2016.     However, he has no heart failure symptoms.  There is been no change in exam.  I suspect he is euvolemic.  No change in therapy.   HYPERTENSION -  The blood pressure is  at target.  No change in therapy.    HYPERLIPIDEMIA -  LDL 77 with an HDL 26.  This is near target.  He will continue the meds as listed.  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  No further imaging is indicated.  No change in therapy or further imaging.   FOOT PAIN- He has had some discomfort in his feet he has mildly reduced pulses.  I am going to check ABIs.  He has neuropathy and is also suggested benfotiamine.   COVID EDUCATION -  He  has had his vaccine.  Current medicines are reviewed at length with the patient today.  The patient does not have concerns regarding medicines.  The following changes have been made:  no change  Labs/ tests ordered today include:   Orders Placed This Encounter  Procedures  . EKG 12-Lead  . VAS [place] ABI  WITH/WO TBI     Disposition:   FU with me in 12 months.     Signed, [name], MD  12/03/2019 11:42 AM    [facility]

## 2019-12-03 ENCOUNTER — Ambulatory Visit (INDEPENDENT_AMBULATORY_CARE_PROVIDER_SITE_OTHER): Payer: Medicare HMO | Admitting: Cardiology

## 2019-12-03 ENCOUNTER — Encounter: Payer: Self-pay | Admitting: Cardiology

## 2019-12-03 ENCOUNTER — Other Ambulatory Visit: Payer: Self-pay

## 2019-12-03 VITALS — BP 148/72 | HR 57 | Ht 70.0 in | Wt 192.4 lb

## 2019-12-03 DIAGNOSIS — Z7189 Other specified counseling: Secondary | ICD-10-CM

## 2019-12-03 DIAGNOSIS — M7989 Other specified soft tissue disorders: Secondary | ICD-10-CM

## 2019-12-03 DIAGNOSIS — I2510 Atherosclerotic heart disease of native coronary artery without angina pectoris: Secondary | ICD-10-CM | POA: Diagnosis not present

## 2019-12-03 DIAGNOSIS — I10 Essential (primary) hypertension: Secondary | ICD-10-CM | POA: Diagnosis not present

## 2019-12-03 DIAGNOSIS — E785 Hyperlipidemia, unspecified: Secondary | ICD-10-CM | POA: Diagnosis not present

## 2019-12-03 NOTE — Patient Instructions (Signed)
Medication Instructions:  No changes  may purchase  Benfotiamine  over the  Counter -for neuropathy *If you need a refill on your cardiac medications before your next appointment, please call your pharmacy*   Lab Work: Not needed   Testing/Procedures:  will  Be schedule at [place] has requested that you have an ankle brachial index (ABI). During this test an ultrasound and blood pressure cuff are used to evaluate the arteries that supply the arms and legs with blood. Allow thirty minutes for this exam. There are no restrictions or special instructions.    Follow-Up: At [facility], you and your health needs are our priority.  As part of our continuing mission to provide you with exceptional heart care, we have created designated Provider Care Teams.  These Care Teams include your primary Cardiologist (physician) and Advanced Practice Providers (APPs -  Physician Assistants and Nurse Practitioners) who all work together to provide you with the care you need, when you need it.    Your next appointment:   12 month(s)  The format for your next appointment:   In Person  Provider:   [name], MD

## 2019-12-07 ENCOUNTER — Telehealth: Payer: Self-pay | Admitting: Pharmacist

## 2019-12-07 ENCOUNTER — Other Ambulatory Visit: Payer: Self-pay | Admitting: Cardiology

## 2019-12-07 DIAGNOSIS — R0989 Other specified symptoms and signs involving the circulatory and respiratory systems: Secondary | ICD-10-CM

## 2019-12-07 DIAGNOSIS — L819 Disorder of pigmentation, unspecified: Secondary | ICD-10-CM

## 2019-12-07 NOTE — Progress Notes (Signed)
    Chronic Care Management Pharmacy Assistant   Name: DEANNA BOEHLKE  MRN: [id number] DOB: 22-Apr-1940  Reason for Encounter: Medication Review/ Initial Questions for Pharmacist visit on 12-08-19.  PCP : Colon Branch, MD  Allergies:   Allergies  Allergen Reactions  . Micardis [Telmisartan]     REACTION: diarrhea    Medications: Outpatient Encounter Medications as of 12/07/2019  Medication Sig  . Acetaminophen (TYLENOL 8 HOUR PO) Take 1 tablet by mouth as needed.  Marland Kitchen amLODipine (NORVASC) 2.5 MG tablet TAKE 1 TABLET DAILY  . aspirin 81 MG chewable tablet 2 daily.   Marland Kitchen atorvastatin (LIPITOR) 40 MG tablet Take 1 tablet (40 mg total) by mouth at bedtime.  Marland Kitchen azelastine (ASTELIN) 0.1 % nasal spray Place 2 sprays into both nostrils at bedtime as needed for rhinitis.  . Coenzyme Q10 (COQ10) 400 MG CAPS Take 1 capsule by mouth daily.  . enalapril (VASOTEC) 10 MG tablet TAKE 1 TABLET TWICE A DAY  . fish oil-omega-3 fatty acids 1000 MG capsule Take 1 g by mouth daily.   . Flaxseed, Linseed, (FLAX SEED OIL PO) Take by mouth.   . fluticasone (FLONASE) 50 MCG/ACT nasal spray Place 2 sprays into both nostrils daily.  . furosemide (LASIX) 20 MG tablet Take 1 tablet (20 mg total) by mouth daily.  . metoprolol tartrate (LOPRESSOR) 100 MG tablet Take 1 tablet (100 mg total) by mouth 2 (two) times daily.  Marland Kitchen omeprazole (PRILOSEC) 40 MG capsule Take 1 capsule (40 mg total) by mouth daily.  . tamsulosin (FLOMAX) 0.4 MG CAPS capsule Take 1 capsule (0.4 mg total) by mouth daily after supper.   No facility-administered encounter medications on file as of 12/07/2019.    Current Diagnosis: Patient Active Problem List   Diagnosis Date Noted  . Educated about COVID-19 virus infection 12/02/2019  . Leg swelling 12/02/2019  . Multiple rib fractures 04/09/2019  . Dyslipidemia 02/10/2018  . Chronic sialoadenitis 12/18/2017  . Coronary artery disease involving native coronary artery of native heart  without angina pectoris 11/21/2016  . Right arm numbness 09/11/2016  . PCP NOTES >>>>>>>>>>>>>>>>>>>>>> 11/02/2014  . Fatigue-- sleepy 04/20/2014  . Eye fatigue 12/07/2013  . Carotid arterial disease ([place]) 10/18/2013  . Sinusitis, chronic  (ill defined L face pressure) 02/05/2012  . Annual physical exam >>>>>>>>>>>>>>>>>>>>>>>>>>> 08/21/2010  . RENAL INSUFFICIENCY, CHRONIC 04/26/2010  . ALLERGIC RHINITIS 04/23/2010  . NECK MASS 07/25/2009  . Diabetes mellitus with neuropathy ([place]) 11/15/2008  . THROMBOCYTOPENIA 06/06/2008  . HYPOGONADISM, MALE 02/04/2008  . CAD 06/01/2007  . DJD (degenerative joint disease) 06/01/2007  . Hyperlipidemia 06/24/2006  . Anxiety-depression- insomnia 06/24/2006  . Essential hypertension 06/24/2006  . GERD 06/24/2006  . HEADACHE 06/24/2006    Goals Addressed   None     Chronic Care Management   Outreach Note  12/07/2019 Name: NIELS CRANSHAW MRN: [id number] DOB: 08-11-1940  Referred by: Colon Branch, MD Reason for referral : Chronic Care Management   An unsuccessful telephone outreach was attempted today. The patient was referred to the pharmacist for assistance with care management and care coordination.       Follow-Up:  Pharmacist Review   [place] Shannon, [place] [facility] [place] Pharmacist Assistant [phone]

## 2019-12-07 NOTE — Progress Notes (Signed)
° ° °  Chronic Care Management Pharmacy Assistant   Name: [name] Little  MRN: [id number] DOB: 08-18-1940  Reason for Encounter: Initial Questions   PCP : Colon Branch, MD  Allergies:   Allergies  Allergen Reactions   Micardis [Telmisartan]     REACTION: diarrhea    Medications: Outpatient Encounter Medications as of 12/07/2019  Medication Sig   Acetaminophen (TYLENOL 8 HOUR PO) Take 1 tablet by mouth as needed.   amLODipine (NORVASC) 2.5 MG tablet TAKE 1 TABLET DAILY   aspirin 81 MG chewable tablet 2 daily.    atorvastatin (LIPITOR) 40 MG tablet Take 1 tablet (40 mg total) by mouth at bedtime.   azelastine (ASTELIN) 0.1 % nasal spray Place 2 sprays into both nostrils at bedtime as needed for rhinitis.   Coenzyme Q10 (COQ10) 400 MG CAPS Take 1 capsule by mouth daily.   enalapril (VASOTEC) 10 MG tablet TAKE 1 TABLET TWICE A DAY   fish oil-omega-3 fatty acids 1000 MG capsule Take 1 g by mouth daily.    Flaxseed, Linseed, (FLAX SEED OIL PO) Take by mouth.    fluticasone (FLONASE) 50 MCG/ACT nasal spray Place 2 sprays into both nostrils daily.   furosemide (LASIX) 20 MG tablet Take 1 tablet (20 mg total) by mouth daily.   metoprolol tartrate (LOPRESSOR) 100 MG tablet Take 1 tablet (100 mg total) by mouth 2 (two) times daily.   omeprazole (PRILOSEC) 40 MG capsule Take 1 capsule (40 mg total) by mouth daily.   tamsulosin (FLOMAX) 0.4 MG CAPS capsule Take 1 capsule (0.4 mg total) by mouth daily after supper.   No facility-administered encounter medications on file as of 12/07/2019.    Current Diagnosis: Patient Active Problem List   Diagnosis Date Noted   Educated about COVID-19 virus infection 12/02/2019   Leg swelling 12/02/2019   Multiple rib fractures 04/09/2019   Dyslipidemia 02/10/2018   Chronic sialoadenitis 12/18/2017   Coronary artery disease involving native coronary artery of native heart without angina pectoris 11/21/2016   Right arm numbness  09/11/2016   PCP NOTES >>>>>>>>>>>>>>>>>>>>>> 11/02/2014   Fatigue-- sleepy 04/20/2014   Eye fatigue 12/07/2013   Carotid arterial disease ([place]) 10/18/2013   Sinusitis, chronic  (ill defined L face pressure) 02/05/2012   Annual physical exam >>>>>>>>>>>>>>>>>>>>>>>>>>> 08/21/2010   RENAL INSUFFICIENCY, CHRONIC 04/26/2010   ALLERGIC RHINITIS 04/23/2010   NECK MASS 07/25/2009   Diabetes mellitus with neuropathy ([place]) 11/15/2008   THROMBOCYTOPENIA 06/06/2008   HYPOGONADISM, MALE 02/04/2008   CAD 06/01/2007   DJD (degenerative joint disease) 06/01/2007   Hyperlipidemia 06/24/2006   Anxiety-depression- insomnia 06/24/2006   Essential hypertension 06/24/2006   GERD 06/24/2006   HEADACHE 06/24/2006    Goals Addressed   None     Chronic Care Management   Outreach Note  12/08/2019 Name: [name] Little MRN: [id number] DOB: 09-Oct-1940  Referred by: Colon Branch, MD Reason for referral : Chronic Care Management   An unsuccessful telephone outreach was attempted today. The patient was referred to the pharmacist for assistance with care management and care coordination. Contact was made by [place] Shannon on 12-07-2019 on 5:12pm. Patient was not available and a message was left.   Follow-Up:  Pharmacist Review   Fanny Skates, [place] Pharmacist Assistant [phone]

## 2019-12-08 ENCOUNTER — Ambulatory Visit: Payer: Medicare HMO | Admitting: Pharmacist

## 2019-12-08 ENCOUNTER — Other Ambulatory Visit: Payer: Self-pay

## 2019-12-08 VITALS — BP 160/56 | HR 54 | Temp 97.9°F | Wt 195.0 lb

## 2019-12-08 DIAGNOSIS — I10 Essential (primary) hypertension: Secondary | ICD-10-CM

## 2019-12-08 DIAGNOSIS — K219 Gastro-esophageal reflux disease without esophagitis: Secondary | ICD-10-CM

## 2019-12-08 DIAGNOSIS — E1140 Type 2 diabetes mellitus with diabetic neuropathy, unspecified: Secondary | ICD-10-CM

## 2019-12-08 DIAGNOSIS — E785 Hyperlipidemia, unspecified: Secondary | ICD-10-CM

## 2019-12-08 NOTE — Chronic Care Management (AMB) (Signed)
Chronic Care Management Pharmacy  Name: [name] Little  MRN: [id number] DOB: 1940/07/24  Chief Complaint/ HPI  [name] Little,  79 y.o. , male presents for their Initial CCM visit with the clinical pharmacist In office.  PCP : Colon Branch, MD  Their chronic conditions include: Hypertension, Hyperlipidemia/CAD, Diabetes, BPH, GERD, Allergic Rhinitis  Office Visits: 10/21/19: Visit w/ Dr. Larose Kells - Referral to cardio with frequent ventricular ectopy. No med changes noted.  Consult Visit: 12/03/19: [place] visit w/ Dr. Percival Spanish - No med changes noted  Medications: Outpatient Encounter Medications as of 12/08/2019  Medication Sig  . Acetaminophen (TYLENOL 8 HOUR PO) Take 1 tablet by mouth as needed.  Marland Kitchen amLODipine (NORVASC) 2.5 MG tablet TAKE 1 TABLET DAILY  . aspirin 81 MG chewable tablet 2 daily.   Marland Kitchen atorvastatin (LIPITOR) 40 MG tablet Take 1 tablet (40 mg total) by mouth at bedtime.  . Coenzyme Q10 (COQ10) 400 MG CAPS Take 1 capsule by mouth daily.  . enalapril (VASOTEC) 10 MG tablet TAKE 1 TABLET TWICE A [name] Little  . fish oil-omega-3 fatty acids 1000 MG capsule Take 1 g by mouth daily.   . Flaxseed, Linseed, (FLAX SEED OIL PO) Take by mouth.   . fluticasone (FLONASE) 50 MCG/ACT nasal spray Place 2 sprays into both nostrils daily.  . furosemide (LASIX) 20 MG tablet Take 1 tablet (20 mg total) by mouth daily.  . metoprolol tartrate (LOPRESSOR) 100 MG tablet Take 1 tablet (100 mg total) by mouth 2 (two) times daily.  Marland Kitchen omeprazole (PRILOSEC) 40 MG capsule Take 1 capsule (40 mg total) by mouth daily.  . tamsulosin (FLOMAX) 0.4 MG CAPS capsule Take 1 capsule (0.4 mg total) by mouth daily after supper.  . [DISCONTINUED] azelastine (ASTELIN) 0.1 % nasal spray Place 2 sprays into both nostrils at bedtime as needed for rhinitis.   No facility-administered encounter medications on file as of 12/08/2019.   SDOH Screenings   Alcohol Screen:   . Last Alcohol Screening Score (AUDIT): Not  on file  Depression (PHQ2-9):   . PHQ-2 Score: Not on file  Financial Resource Strain:   . Difficulty of Paying Living Expenses: Not on file  Food Insecurity:   . Worried About Charity fundraiser in the Last Year: Not on file  . Ran Out of Food in the Last Year: Not on file  Housing:   . Last Housing Risk Score: Not on file  Physical Activity:   . Days of Exercise per Week: Not on file  . Minutes of Exercise per Session: Not on file  Social Connections:   . Frequency of Communication with Friends and Family: Not on file  . Frequency of Social Gatherings with Friends and Family: Not on file  . Attends Religious Services: Not on file  . Active Member of Clubs or Organizations: Not on file  . Attends Archivist Meetings: Not on file  . Marital Status: Not on file  Stress:   . Feeling of Stress : Not on file  Tobacco Use: Medium Risk  . Smoking Tobacco Use: Former Smoker  . Smokeless Tobacco Use: Never Used  Transportation Needs:   . Film/video editor (Medical): Not on file  . Lack of Transportation (Non-Medical): Not on file     Current Diagnosis/Assessment:  Goals Addressed            This Visit's Progress   . Chronic Care Management Pharmacy Care Plan  CARE PLAN ENTRY (see longitudinal plan of care for additional care plan information)  Current Barriers:  . Chronic Disease Management support, education, and care coordination needs related to Hypertension, Hyperlipidemia/CAD, Diabetes, BPH, GERD, Allergic Rhinitis   Hypertension BP Readings from Last 3 Encounters:  12/08/19 (!) 160/56  12/03/19 (!) 148/72  10/21/19 138/64   . Pharmacist Clinical Goal(s): o Over the next 90 days, patient will work with PharmD and providers to achieve BP goal <140/90 . Current regimen:  . Amlodipine 2.85m daily . Enalapril 161mtwice daily . Metoprolol tartrate 10038mwice daily . Furosemide 20m39mily . Interventions: o Requested patient to check BP 1-2  times per week . Patient self care activities - Over the next 90 days, patient will: o Check BP 1-2 times per week, document, and provide at future appointments o Ensure daily salt intake < 2300 mg/Pinchus Weckwerth  Hyperlipidemia/CAD Lab Results  Component Value Date/Time   LDLCALC 77 06/01/2019 09:42 AM   . Pharmacist Clinical Goal(s): o Over the next 90 days, patient will work with PharmD and providers to achieve LDL goal < 70 . Current regimen:  . Atorvastatin 40mg66mly . Aspirin 81mg 36maily HS (recommended by provider per pt) . Coenzyme Q10 400mg d72m . Fish Oil 1000mg da51m. Flaxseed oil . Interventions: o Discussed LDL goal  . Patient self care activities - Over the next 90 days, patient will: o Maintain cholesterol medication regimen.   Diabetes Lab Results  Component Value Date/Time   HGBA1C 6.6 (H) 10/21/2019 10:47 AM   HGBA1C 6.8 (H) 06/01/2019 09:42 AM   . Pharmacist Clinical Goal(s): o Over the next 90 days, patient will work with PharmD and providers to maintain A1c goal <7% . Current regimen:  o Diet and exercise management   . Interventions: o Discussed diet and exercise  o Discussed goal to drink at least one bottle of water per [name] Little . Patient self care activities - Over the next 90 days, patient will: o Maintain a1c <7%  GERD . Pharmacist Clinical Goal(s) o Over the next 90 days, patient will work with PharmD and providers to reduce symptoms associated with GERD and reduce polypharmacy . Current regimen:  o Omeprazole 40mg dai15m Interventions: o Discussed the risk/benefit of continuation vs discontinuation of long term PPI use  o Collaboration with provider regarding medication management (consider decreasing omeprazole to 20mg dail30m Patient self care activities - Over the next 90 days, patient will: o Consider decreasing omeprazole to 20mg daily33m Dr. Paz approvaLarose KellsMedication management . Pharmacist Clinical Goal(s): o Over the next 90 days,  patient will work with PharmD and providers to maintain optimal medication adherence . Current pharmacy: [facility] . Interventions o Comprehensive medication review performed. o Continue current medication management strategy . Patient self care activities - Over the next 90 days, patient will: o Focus on medication adherence by filling and taking medications appropriately  o Take medications as prescribed o Report any questions or concerns to PharmD and/or provider(s)  Initial goal documentation       Social Hx:  Married 49 years. No children.  Two toy poodles (Elsa and SScientist, clinical (histocompatibility and immunogenetics)e) [place] ([place]). 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  Wife retired secretary. 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 His wife likes them moreso than he does.  Used to collect stamps. Likes to read. Bill O'ReilMikki Santees non-fiction.  Hypertension   BP goal is:  <  140/90  Office blood pressures are  BP Readings from Last 3 Encounters:  12/08/19 (!) 160/56  12/03/19 (!) 148/72  10/21/19 138/64   Patient checks BP at home several times per month Patient home BP readings are ranging: Unable to assess  Patient has failed these meds in the past: telmisartan (diarrhea) Patient is currently uncontrolled on the following medications:  . Amlodipine 2.11m daily . Enalapril 149mtwice daily . Metoprolol tartrate 10063mwice daily . Furosemide 26m78mily  Reports he was taking it daily, but this caused him to worry, so he only checks it occasionally or when he feels weird.  Denies chest pain and dizziness. Reports he has headaches L>R due to deviated septum, but this is due to his severe allergies.   Wonders about grogginess...metoprolol??  Plan -Consider checking BP 1-2 times per week -Continue current medications    Hyperlipidemia/CAD   LDL goal <70  Lipid Panel     Component Value Date/Time   CHOL 142 06/01/2019 0942   TRIG 190.0 (H)  06/01/2019 0942   HDL 26.30 (L) 06/01/2019 0942   LDLCALC 77 06/01/2019 0942    Hepatic Function Latest Ref Rng & Units 06/01/2019 04/23/2018 01/16/2017  Total Protein 6.0 - 8.3 g/dL 6.7 6.0 6.5  Albumin 3.5 - 5.2 g/dL 4.3 4.0 4.0  AST 0 - 37 U/L _0 ALT 0 - 53 U/L _1 Alk Phosphatase 39 - 117 U/L 94 55 54  Total Bilirubin 0.2 - 1.2 mg/dL 1.2 1.0 0.8  Bilirubin, Direct 0.0 - 0.3 mg/dL - - -     The ASCVD Risk score (GoffSan Mateot al., 2013) failed to calculate for the following reasons:   The 2013 ASCVD risk score is only valid for ages 40 t3079  38atient has failed these meds in past: simvastatin (inefficacy?) Patient is currently uncontrolled on the following medications:  . Atorvastatin 40mg78mly . Aspirin 81mg 19maily HS (recommended by provider per pt) . Coenzyme Q10 400mg d14m . Fish Oil 1000mg da71m. Flaxseed oil  We discussed:  Discussed LDL goal  Plan -Continue current medications   Diabetes   A1c goal <7%  Recent Relevant Labs: Lab Results  Component Value Date/Time   HGBA1C 6.6 (H) 10/21/2019 10:47 AM   HGBA1C 6.8 (H) 06/01/2019 09:42 AM   GFR 44.69 (L) 10/21/2019 10:47 AM   GFR 50.05 (L) 06/01/2019 09:42 AM   MICROALBUR 0.4 03/31/2009 09:11 AM   MICROALBUR 0.2 10/05/2007 12:00 AM    Last diabetic Eye exam:  Lab Results  Component Value Date/Time   HMDIABEYEEXA No Retinopathy 08/10/2019 12:00 AM    Last diabetic Foot exam: No results found for: HMDIABFOOTEX   Checking BG: Never  Patient has failed these meds in past: None noted Patient is currently controlled on the following medications: . None  Diet B - Cereal (shredded wheat, cheerios); oatmeal with fruit L - Chic-fil-a; turkey sKuwaith D - Frozen meals; turkey, [place]; meat loaf; salads Snacks - "I'd rather not say"; cookies  Drinks - "I don't like water" "If I drink a bottle of water a Josimar Corning, it's a miracle", unsweet tea;   Exercise Works out in the yard a lot.  We  discussed: diet and exercise extensively  Plan -Goal to drink at least one bottle of water per Canisha Issac -Continue control with diet and exercise  BPH   PSA  Date Value Ref Range Status  06/01/2019 0.88 0.10 - 4.00 ng/mL  Final    Comment:    Test performed using Access Hybritech PSA Assay, a parmagnetic partical, chemiluminecent immunoassay.  12/02/2018 0.95 0.10 - 4.00 ng/mL Final    Comment:    Test performed using Access Hybritech PSA Assay, a parmagnetic partical, chemiluminecent immunoassay.  04/23/2018 1.51 0.10 - 4.00 ng/mL Final    Comment:    Test performed using Access Hybritech PSA Assay, a parmagnetic partical, chemiluminecent immunoassay.     Patient has failed these meds in past: None noted  Patient is currently controlled on the following medications:  . Tamsulosin 0.79m daily  Overnight Urination: 3-4 times (sometimes he feels like he has to go, but nothing comes out) Dribble vs Stream: Mixture of both Bladder Emptying: Yes  Plan -Continue current medications  GERD   Patient has failed these meds in past: None noted  Patient is currently controlled on the following medications:  . Omeprazole 454mdaily  Breakthrough Sx: None Breakthrough Tx: None  Wonders if he should still be taking omeprazole. He states he has taken for 8-10 years  We discussed: the risk/benefit of continuation vs discontinuation of long term PPI use  Plan -Consider decreasing omeprazole to 2069maily to see if patient tolerates this dose pending Dr. PazLarose Kellsproval    Allergic Rhinitis    Patient has failed these meds in past: oral meds not effective Patient is currently uncontrolled on the following medications: . Azelastine 0.1% nasal spray 2 sprays in both nostrils at bedtime . Fluticasone 54m48masal spray  2 sprays in both nostrils daily  States at night time he gets "stopped up" and gets headaches. Reports he has a deviated septum that causes a build up of pressure Pt states  he has been evaluated by ENT. Unsure of what further to do to assist patient  Plan -Continue current medications   Vaccines   Reviewed and discussed patient's vaccination history.    Immunization History  Administered Date(s) Administered  . Influenza Split 01/09/2011, 11/27/2011, 11/04/2013  . Influenza Whole 01/14/2007, 11/30/2008, 12/06/2009, 11/20/2012  . Influenza, High Dose Seasonal PF 11/20/2012, 12/22/2017, 10/29/2018, 10/13/2019  . Influenza-Unspecified 10/26/2014, 11/11/2015, 12/05/2016  . Moderna SARS-COVID-2 Vaccination 04/18/2019, 05/17/2019  . Pneumococcal Conjugate-13 10/18/2013  . Pneumococcal Polysaccharide-23 03/05/2003, 03/31/2009, 09/11/2011  . Td 10/05/2007  . Tdap 08/05/2012  . Zoster 03/18/2014   We discussed Shingrix, but he states he is not interested in this vaccine    Medication Management   [facility] Uses pill box? Yes Pt reports he only forgets to take his meds 1-2 times per year.  Loves his vitamins  Miscellaneous Meds Acetaminophen 8hr as needed Furosemide 20mg13mly  Meds to D/C from list   We discussed: Current pharmacy is preferred with insurance plan and patient is satisfied with pharmacy services  Plan  Continue current medication management strategy   Follow up:  1 month in person visit  KanesMelvenia Beam PharmD Clinical Pharmacist [phone]

## 2019-12-09 ENCOUNTER — Other Ambulatory Visit: Payer: Self-pay | Admitting: Internal Medicine

## 2019-12-16 ENCOUNTER — Other Ambulatory Visit: Payer: Self-pay

## 2019-12-16 ENCOUNTER — Ambulatory Visit (HOSPITAL_COMMUNITY)
Admission: RE | Admit: 2019-12-16 | Discharge: 2019-12-16 | Disposition: A | Discharge status: Home / Self Care | Payer: Medicare HMO | Source: Ambulatory Visit | Attending: Internal Medicine | Admitting: Internal Medicine

## 2019-12-16 DIAGNOSIS — R0989 Other specified symptoms and signs involving the circulatory and respiratory systems: Secondary | ICD-10-CM | POA: Insufficient documentation

## 2019-12-16 DIAGNOSIS — L819 Disorder of pigmentation, unspecified: Secondary | ICD-10-CM | POA: Diagnosis not present

## 2019-12-23 ENCOUNTER — Telehealth: Payer: Self-pay | Admitting: Cardiology

## 2019-12-23 NOTE — Telephone Encounter (Signed)
Called and spoke to patient. He states that he had lower extremity dopplers done yesterday and was told at the appointment that he would need to see Dr. Gwenlyn Found or Dr. Fletcher Anon. Made patient aware that Dr. Percival Spanish will need to review his results first before placing a referral and that we will call him to review his results and give any recommendations that he has. Patient verbalized understanding and thanked me for the call.

## 2019-12-23 NOTE — Telephone Encounter (Signed)
Patient had a LE Arterial Doppler done on 12/16/19. He states that they did find some issues with his legs. He says he was to be referred to Dr. Gwenlyn Found or Dr. Fletcher Anon. I don't see a referral or any notes indicating this. Please advise.

## 2020-01-01 NOTE — Patient Instructions (Signed)
Visit Information  Goals Addressed            This Visit's Progress    Chronic Care Management Pharmacy Care Plan       CARE PLAN ENTRY (see longitudinal plan of care for additional care plan information)  Current Barriers:   Chronic Disease Management support, education, and care coordination needs related to Hypertension, Hyperlipidemia/CAD, Diabetes, BPH, GERD, Allergic Rhinitis   Hypertension BP Readings from Last 3 Encounters:  12/08/19 (!) 160/56  12/03/19 (!) 148/72  10/21/19 138/64    Pharmacist Clinical Goal(s): o Over the next 90 days, patient will work with PharmD and providers to achieve BP goal <140/90  Current regimen:   Amlodipine 2.5mg  daily  Enalapril 10mg  twice daily  Metoprolol tartrate 100mg  twice daily  Furosemide 20mg  daily  Interventions: o Requested patient to check BP 1-2 times per week  Patient self care activities - Over the next 90 days, patient will: o Check BP 1-2 times per week, document, and provide at future appointments o Ensure daily salt intake < 2300 mg/[name] Little  Hyperlipidemia/CAD Lab Results  Component Value Date/Time   LDLCALC 77 06/01/2019 09:42 AM    Pharmacist Clinical Goal(s): o Over the next 90 days, patient will work with PharmD and providers to achieve LDL goal < 70  Current regimen:   Atorvastatin 40mg  daily  Aspirin 81mg  #2 daily HS (recommended by provider per pt)  Coenzyme Q10 400mg  daily  Fish Oil 1000mg  daily  Flaxseed oil  Interventions: o Discussed LDL goal   Patient self care activities - Over the next 90 days, patient will: o Maintain cholesterol medication regimen.   Diabetes Lab Results  Component Value Date/Time   HGBA1C 6.6 (H) 10/21/2019 10:47 AM   HGBA1C 6.8 (H) 06/01/2019 09:42 AM    Pharmacist Clinical Goal(s): o Over the next 90 days, patient will work with PharmD and providers to maintain A1c goal <7%  Current regimen:  o Diet and exercise management     Interventions: o Discussed diet and exercise  o Discussed goal to drink at least one bottle of water per [name] Little  Patient self care activities - Over the next 90 days, patient will: o Maintain a1c <7%  GERD  Pharmacist Clinical Goal(s) o Over the next 90 days, patient will work with PharmD and providers to reduce symptoms associated with GERD and reduce polypharmacy  Current regimen:  o Omeprazole 40mg  daily  Interventions: o Discussed the risk/benefit of continuation vs discontinuation of long term PPI use  o Collaboration with provider regarding medication management (consider decreasing omeprazole to 20mg  daily)  Patient self care activities - Over the next 90 days, patient will: o Consider decreasing omeprazole to 20mg  daily per Dr. Larose Kells approval  Medication management  Pharmacist Clinical Goal(s): o Over the next 90 days, patient will work with PharmD and providers to maintain optimal medication adherence  [facility]  Interventions o Comprehensive medication review performed. o Continue current medication management strategy  Patient self care activities - Over the next 90 days, patient will: o Focus on medication adherence by filling and taking medications appropriately  o Take medications as prescribed o Report any questions or concerns to PharmD and/or provider(s)  Initial goal documentation        [name] Little was given information about Chronic Care Management services today including:  1. CCM service includes personalized support from designated clinical staff supervised by his physician, including individualized plan of care and coordination with other  care providers 2. 24/7 contact phone numbers for assistance for urgent and routine care needs. 3. Standard insurance, coinsurance, copays and deductibles apply for chronic care management only during months in which we provide at least 20 minutes of these services. Most insurances  cover these services at 100%, however patients may be responsible for any copay, coinsurance and/or deductible if applicable. This service may help you avoid the need for more expensive face-to-face services. 4. Only one practitioner may furnish and bill the service in a calendar month. 5. The patient may stop CCM services at any time (effective at the end of the month) by phone call to the office staff.  Patient agreed to services and verbal consent obtained.   The patient verbalized understanding of instructions provided today and agreed to receive a mailed copy of patient instruction and/or educational materials. Face to Face appointment with pharmacist scheduled for:  01/14/2020  De Blanch, PharmD Clinical Pharmacist [facility] [phone]   How to Take Your Blood Pressure You can take your blood pressure at home with a machine. You may need to check your blood pressure at home:  To check if you have high blood pressure (hypertension).  To check your blood pressure over time.  To make sure your blood pressure medicine is working. Supplies needed: You will need a blood pressure machine, or monitor. You can buy one at a drugstore or online. When choosing one:  Choose one with an arm cuff.  Choose one that wraps around your upper arm. Only one finger should fit between your arm and the cuff.  Do not choose one that measures your blood pressure from your wrist or finger. Your doctor can suggest a monitor. How to prepare Avoid these things for 30 minutes before checking your blood pressure:  Drinking caffeine.  Drinking alcohol.  Eating.  Smoking.  Exercising. Five minutes before checking your blood pressure:  Pee.  Sit in a dining chair. Avoid sitting in a soft couch or armchair.  Be quiet. Do not talk. How to take your blood pressure Follow the instructions that came with your machine. If you have a digital blood pressure  monitor, these may be the instructions: 1. Sit up straight. 2. Place your feet on the floor. Do not cross your ankles or legs. 3. Rest your left arm at the level of your heart. You may rest it on a table, desk, or chair. 4. Pull up your shirt sleeve. 5. Wrap the blood pressure cuff around the upper part of your left arm. The cuff should be 1 inch (2.5 cm) above your elbow. It is best to wrap the cuff around bare skin. 6. Fit the cuff snugly around your arm. You should be able to place only one finger between the cuff and your arm. 7. Put the cord inside the groove of your elbow. 8. Press the power button. 9. Sit quietly while the cuff fills with air and loses air. 10. Write down the numbers on the screen. 11. Wait 2-3 minutes and then repeat steps 1-10. What do the numbers mean? Two numbers make up your blood pressure. The first number is called systolic pressure. The second is called diastolic pressure. An example of a blood pressure reading is "120 over 80" (or 120/80). If you are an adult and do not have a medical condition, use this guide to find out if your blood pressure is normal: Normal  First number: below 120.  Second number: below 80.  Elevated  First number: 120-129.  Second number: below 80. Hypertension stage 1  First number: 130-139.  Second number: 80-89. Hypertension stage 2  First number: 140 or above.  Second number: 50 or above. Your blood pressure is above normal even if only the top or bottom number is above normal. Follow these instructions at home:  Check your blood pressure as often as your doctor tells you to.  Take your monitor to your next doctor's appointment. Your doctor will: ? Make sure you are using it correctly. ? Make sure it is working right.  Make sure you understand what your blood pressure numbers should be.  Tell your doctor if your medicines are causing side effects. Contact a doctor if:  Your blood pressure keeps being  high. Get help right away if:  Your first blood pressure number is higher than 180.  Your second blood pressure number is higher than 120. This information is not intended to replace advice given to you by your health care provider. Make sure you discuss any questions you have with your health care provider. Document Revised: 01/31/2017 Document Reviewed: 07/28/2015 Elsevier Patient Education  2020 [employer].

## 2020-01-03 NOTE — Telephone Encounter (Signed)
Patient aware of results and has appointment 11/23

## 2020-01-14 ENCOUNTER — Ambulatory Visit: Payer: Medicare HMO

## 2020-01-25 ENCOUNTER — Encounter: Payer: Self-pay | Admitting: Cardiovascular Disease

## 2020-01-25 ENCOUNTER — Other Ambulatory Visit: Payer: Self-pay

## 2020-01-25 ENCOUNTER — Ambulatory Visit: Payer: Medicare HMO | Admitting: Cardiovascular Disease

## 2020-01-25 VITALS — BP 148/72 | HR 74 | Ht 70.0 in | Wt 195.0 lb

## 2020-01-25 DIAGNOSIS — R0989 Other specified symptoms and signs involving the circulatory and respiratory systems: Secondary | ICD-10-CM | POA: Diagnosis not present

## 2020-01-25 DIAGNOSIS — I739 Peripheral vascular disease, unspecified: Secondary | ICD-10-CM | POA: Diagnosis not present

## 2020-01-25 MED ORDER — SODIUM CHLORIDE 0.9% FLUSH: 3.0000 mL | Freq: Two times a day (BID) | INTRAVENOUS | Status: AC

## 2020-01-25 NOTE — Patient Instructions (Addendum)
    [address] [place] [address] Dept: [phone] Loc: [place] [facility]  01/25/2020   GO TO [address] [place] ON Monday 01-31-20 @ 11:25 AM FOR COVID TESTING  You are scheduled for a Cardiac Catheterization on Monday, December 2 with Dr. [name].  1. Please arrive at the [facility] (Main Entrance A) at [facility]: [place], [place] [address] at 6:00 AM (This time is two hours before your procedure to ensure your preparation). Free valet parking service is available.   Special note: Every effort is made to have your procedure done on time. Please understand that emergencies sometimes delay scheduled procedures.  2. Diet: Do not eat solid foods after midnight.  The patient may have clear liquids until 5am upon the day of the procedure.  3. Labs: You will need to have blood drawn on TODAY  4. Medication instructions in preparation for your procedure:  DO NOT TAKE FUROSEMIDE THE MORNING OF THE PROCEDURE  On the morning of your procedure, take your Aspirin and any morning medicines NOT listed above.  You may use sips of water.  5. Plan for one night stay--bring personal belongings. 6. Bring a current list of your medications and current insurance cards. 7. You MUST have a responsible person to drive you home. 8. Someone MUST be with you the first 24 hours after you arrive home or your discharge will be delayed. 9. Please wear clothes that are easy to get on and off and wear slip-on shoes.  Thank you for allowing [place] to care for you!   -- [facility]   Your physician has requested that you have a lower extremity arterial duplex. This test is an ultrasound of the arteries in the legs or arms. It looks at arterial blood flow in the legs and arms. Allow one hour for Lower and Upper  Arterial scans. There are no restrictions or special instructions 1 WEEK AFTER PROCEDURE  Your physician recommends that you schedule a follow-up appointment in: 2 [place]

## 2020-01-25 NOTE — Addendum Note (Signed)
Addended by: Cristopher Estimable on: 01/25/2020 02:21 PM   Modules accepted: Orders, SmartSet

## 2020-01-25 NOTE — H&P (View-Only) (Signed)
01/25/2020 [name] Little   26-Mar-1940  [id number]  Primary Physician Colon Branch, MD Primary Cardiologist: Lorretta Harp MD Garret Reddish, [place], [place]  HPI:  [name] Little is a 79 y.o. 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  He was referred by Dr. Percival Spanish, his cardiologist, for evaluation and treatment of symptomatic PAD.  He has a history of diabetes, hypertension hyperlipidemia.  Both his parents had CAD.  He had CABG in 2004 and has moderate LV dysfunction.  He denies chest pain or shortness of breath.  His last 2D echo 12/22/2014 revealed an EF of 45 to 50%.  He tells me that when he walks uphill he gets right hip and buttock claudication which is consistent with his recent Doppler studies performed 12/16/2019 revealing a right ABI of 0.78 with a high-frequency signal in his right common iliac artery.   Current Meds  Medication Sig  . Acetaminophen (TYLENOL 8 HOUR PO) Take 1 tablet by mouth as needed.  Marland Kitchen amLODipine (NORVASC) 2.5 MG tablet TAKE 1 TABLET DAILY  . aspirin 81 MG chewable tablet 2 daily.   Marland Kitchen atorvastatin (LIPITOR) 40 MG tablet Take 1 tablet (40 mg total) by mouth at bedtime.  Marland Kitchen azelastine (ASTELIN) 0.1 % nasal spray Place 2 sprays into both nostrils at bedtime as needed for rhinitis or allergies.  . Coenzyme Q10 (COQ10) 400 MG CAPS Take 1 capsule by mouth daily.  . enalapril (VASOTEC) 10 MG tablet TAKE 1 TABLET TWICE A DAY  . fish oil-omega-3 fatty acids 1000 MG capsule Take 1 g by mouth daily.   . Flaxseed, Linseed, (FLAX SEED OIL PO) Take by mouth.   . fluticasone (FLONASE) 50 MCG/ACT nasal spray Place 2 sprays into both nostrils daily.  . furosemide (LASIX) 20 MG tablet Take 1 tablet (20 mg total) by mouth daily.  . metoprolol tartrate (LOPRESSOR) 100 MG tablet Take 1 tablet (100 mg total) by mouth 2 (two) times daily.  Marland Kitchen omeprazole (PRILOSEC) 40 MG capsule Take 1 capsule  (40 mg total) by mouth daily.  . tamsulosin (FLOMAX) 0.4 MG CAPS capsule Take 1 capsule (0.4 mg total) by mouth daily after supper.  Marland Kitchen VITAMIN D, CHOLECALCIFEROL, PO Take 1 capsule by mouth daily.     Allergies  Allergen Reactions  . Micardis [Telmisartan]     REACTION: diarrhea    Social History   Socioeconomic History  . Marital status: Married    Spouse name: Not on file  . Number of children: 0  . Years of education: Not on file  . Highest education level: Not on file  Occupational History  . Occupation: retired Freight forwarder   Tobacco Use  . Smoking status: Former Smoker    Types: Cigarettes    Quit date: 12/16/1976    Years since quitting: 43.1  . Smokeless tobacco: Never Used  Substance and Sexual Activity  . Alcohol use: No    Alcohol/week: 0.0 standard drinks  . Drug use: No  . Sexual activity: Not on file  Other Topics Concern  . Not on file  Social History Narrative   Married , lives w/ wife , no children         Social Determinants of Health   Financial Resource Strain:   . Difficulty of Paying Living Expenses: Not on file  Food Insecurity:   . Worried About Charity fundraiser in the Last Year: Not  on file  . Ran Out of Food in the Last Year: Not on file  Transportation Needs:   . Lack of Transportation (Medical): Not on file  . Lack of Transportation (Non-Medical): Not on file  Physical Activity:   . Days of Exercise per Week: Not on file  . Minutes of Exercise per Session: Not on file  Stress:   . Feeling of Stress : Not on file  Social Connections:   . Frequency of Communication with Friends and Family: Not on file  . Frequency of Social Gatherings with Friends and Family: Not on file  . Attends Religious Services: Not on file  . Active Member of Clubs or Organizations: Not on file  . Attends Archivist Meetings: Not on file  . Marital Status: Not on file  Intimate Partner Violence:   . Fear of Current or Ex-Partner: Not on file  .  Emotionally Abused: Not on file  . Physically Abused: Not on file  . Sexually Abused: Not on file     Review of Systems: General: negative for chills, fever, night sweats or weight changes.  Cardiovascular: negative for chest pain, dyspnea on exertion, edema, orthopnea, palpitations, paroxysmal nocturnal dyspnea or shortness of breath Dermatological: negative for rash Respiratory: negative for cough or wheezing Urologic: negative for hematuria Abdominal: negative for nausea, vomiting, diarrhea, bright red blood per rectum, melena, or hematemesis Neurologic: negative for visual changes, syncope, or dizziness All other systems reviewed and are otherwise negative except as noted above.    Blood pressure (!) 148/72, pulse 74, height 5\' 10"  (1.778 m), weight 195 lb (88.5 kg).  General appearance: alert and no distress Neck: no adenopathy, no carotid bruit, no JVD, supple, symmetrical, trachea midline and thyroid not enlarged, symmetric, no tenderness/mass/nodules Lungs: clear to auscultation bilaterally Heart: regular rate and rhythm, S1, S2 normal, no murmur, click, rub or gallop Extremities: extremities normal, atraumatic, no cyanosis or edema Pulses: 2+ and symmetric Diminished right pedal pulse Skin: Skin color, texture, turgor normal. No rashes or lesions Neurologic: Alert and oriented X 3, normal strength and tone. Normal symmetric reflexes. Normal coordination and gait  EKG not performed today  ASSESSMENT AND PLAN:   Peripheral arterial disease [facility]) Mr. Savoca was referred to me by Dr. Percival Spanish for symptomatic PAD.  He does complain of right hip claudication.  He also has CAD status post remote CABG.  He had lower extremity arterial Doppler studies performed in our office 12/16/2019 revealing a right ABI 0.78 and a left of 1.24.  He has a high-frequency signal in his proximal right common iliac artery and wishes to proceed with angiography and endovascular therapy for lifestyle  limiting claudication.      Lorretta Harp MD FACP,FACC,FAHA, [facility] 01/25/2020 1:51 PM

## 2020-01-25 NOTE — Progress Notes (Signed)
01/25/2020 PIO [name]   20-Nov-1940  [id number]  Primary Physician Colon Branch, MD Primary Cardiologist: Lorretta Harp MD Garret Reddish, [place], [place]  HPI:  [name] Little is a 79 y.o. 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  He was referred by Dr. Percival Spanish, his cardiologist, for evaluation and treatment of symptomatic PAD.  He has a history of diabetes, hypertension hyperlipidemia.  Both his parents had CAD.  He had CABG in 2004 and has moderate LV dysfunction.  He denies chest pain or shortness of breath.  His last 2D echo 12/22/2014 revealed an EF of 45 to 50%.  He tells me that when he walks uphill he gets right hip and buttock claudication which is consistent with his recent Doppler studies performed 12/16/2019 revealing a right ABI of 0.78 with a high-frequency signal in his right common iliac artery.   Current Meds  Medication Sig  . Acetaminophen (TYLENOL 8 HOUR PO) Take 1 tablet by mouth as needed.  Marland Kitchen amLODipine (NORVASC) 2.5 MG tablet TAKE 1 TABLET DAILY  . aspirin 81 MG chewable tablet 2 daily.   Marland Kitchen atorvastatin (LIPITOR) 40 MG tablet Take 1 tablet (40 mg total) by mouth at bedtime.  Marland Kitchen azelastine (ASTELIN) 0.1 % nasal spray Place 2 sprays into both nostrils at bedtime as needed for rhinitis or allergies.  . Coenzyme Q10 (COQ10) 400 MG CAPS Take 1 capsule by mouth daily.  . enalapril (VASOTEC) 10 MG tablet TAKE 1 TABLET TWICE A DAY  . fish oil-omega-3 fatty acids 1000 MG capsule Take 1 g by mouth daily.   . Flaxseed, Linseed, (FLAX SEED OIL PO) Take by mouth.   . fluticasone (FLONASE) 50 MCG/ACT nasal spray Place 2 sprays into both nostrils daily.  . furosemide (LASIX) 20 MG tablet Take 1 tablet (20 mg total) by mouth daily.  . metoprolol tartrate (LOPRESSOR) 100 MG tablet Take 1 tablet (100 mg total) by mouth 2 (two) times daily.  Marland Kitchen omeprazole (PRILOSEC) 40 MG capsule Take 1 capsule  (40 mg total) by mouth daily.  . tamsulosin (FLOMAX) 0.4 MG CAPS capsule Take 1 capsule (0.4 mg total) by mouth daily after supper.  Marland Kitchen VITAMIN D, CHOLECALCIFEROL, PO Take 1 capsule by mouth daily.     Allergies  Allergen Reactions  . Micardis [Telmisartan]     REACTION: diarrhea    Social History   Socioeconomic History  . Marital status: Married    Spouse name: Not on file  . Number of children: 0  . Years of education: Not on file  . Highest education level: Not on file  Occupational History  . Occupation: retired Freight forwarder   Tobacco Use  . Smoking status: Former Smoker    Types: Cigarettes    Quit date: 12/16/1976    Years since quitting: 43.1  . Smokeless tobacco: Never Used  Substance and Sexual Activity  . Alcohol use: No    Alcohol/week: 0.0 standard drinks  . Drug use: No  . Sexual activity: Not on file  Other Topics Concern  . Not on file  Social History Narrative   Married , lives w/ wife , no children         Social Determinants of Health   Financial Resource Strain:   . Difficulty of Paying Living Expenses: Not on file  Food Insecurity:   . Worried About Charity fundraiser in the Last Year: Not  on file  . Ran Out of Food in the Last Year: Not on file  Transportation Needs:   . Lack of Transportation (Medical): Not on file  . Lack of Transportation (Non-Medical): Not on file  Physical Activity:   . Days of Exercise per Week: Not on file  . Minutes of Exercise per Session: Not on file  Stress:   . Feeling of Stress : Not on file  Social Connections:   . Frequency of Communication with Friends and Family: Not on file  . Frequency of Social Gatherings with Friends and Family: Not on file  . Attends Religious Services: Not on file  . Active Member of Clubs or Organizations: Not on file  . Attends Archivist Meetings: Not on file  . Marital Status: Not on file  Intimate Partner Violence:   . Fear of Current or Ex-Partner: Not on file  .  Emotionally Abused: Not on file  . Physically Abused: Not on file  . Sexually Abused: Not on file     Review of Systems: General: negative for chills, fever, night sweats or weight changes.  Cardiovascular: negative for chest pain, dyspnea on exertion, edema, orthopnea, palpitations, paroxysmal nocturnal dyspnea or shortness of breath Dermatological: negative for rash Respiratory: negative for cough or wheezing Urologic: negative for hematuria Abdominal: negative for nausea, vomiting, diarrhea, bright red blood per rectum, melena, or hematemesis Neurologic: negative for visual changes, syncope, or dizziness All other systems reviewed and are otherwise negative except as noted above.    Blood pressure (!) 148/72, pulse 74, height 5\' 10"  (1.778 m), weight 195 lb (88.5 kg).  General appearance: alert and no distress Neck: no adenopathy, no carotid bruit, no JVD, supple, symmetrical, trachea midline and thyroid not enlarged, symmetric, no tenderness/mass/nodules Lungs: clear to auscultation bilaterally Heart: regular rate and rhythm, S1, S2 normal, no murmur, click, rub or gallop Extremities: extremities normal, atraumatic, no cyanosis or edema Pulses: 2+ and symmetric Diminished right pedal pulse Skin: Skin color, texture, turgor normal. No rashes or lesions Neurologic: Alert and oriented X 3, normal strength and tone. Normal symmetric reflexes. Normal coordination and gait  EKG not performed today  ASSESSMENT AND PLAN:   Peripheral arterial disease [facility]) [name] was referred to me by Dr. Percival Spanish for symptomatic PAD.  He does complain of right hip claudication.  He also has CAD status post remote CABG.  He had lower extremity arterial Doppler studies performed in our office 12/16/2019 revealing a right ABI 0.78 and a left of 1.24.  He has a high-frequency signal in his proximal right common iliac artery and wishes to proceed with angiography and endovascular therapy for lifestyle  limiting claudication.      Lorretta Harp MD FACP,FACC,FAHA, [facility] 01/25/2020 1:51 PM

## 2020-01-25 NOTE — Assessment & Plan Note (Signed)
[name] Little was referred to me by Dr. Percival Spanish for symptomatic PAD.  He does complain of right hip claudication.  He also has CAD status post remote CABG.  He had lower extremity arterial Doppler studies performed in our office 12/16/2019 revealing a right ABI 0.78 and a left of 1.24.  He has a high-frequency signal in his proximal right common iliac artery and wishes to proceed with angiography and endovascular therapy for lifestyle limiting claudication.

## 2020-01-26 LAB — BASIC METABOLIC PANEL
BUN/Creatinine Ratio: 15 (ref 10–24)
BUN: 20 mg/dL (ref 8–27)
CO2: 26 mmol/L (ref 20–29)
Calcium: 9.5 mg/dL (ref 8.6–10.2)
Chloride: 99 mmol/L (ref 96–106)
Creatinine, Ser: 1.36 mg/dL — ABNORMAL HIGH (ref 0.76–1.27)
GFR calc Af Amer: 56 mL/min/{1.73_m2} — ABNORMAL LOW (ref >59)
GFR calc non Af Amer: 49 mL/min/{1.73_m2} — ABNORMAL LOW (ref >59)
Glucose: 121 mg/dL — ABNORMAL HIGH (ref 65–99)
Potassium: 5 mmol/L (ref 3.5–5.2)
Sodium: 140 mmol/L (ref 134–144)

## 2020-01-26 LAB — CBC
Hematocrit: 47 % (ref 37.5–51.0)
Hemoglobin: 15.9 g/dL (ref 13.0–17.7)
MCH: 31.4 pg (ref 26.6–33.0)
MCHC: 33.8 g/dL (ref 31.5–35.7)
MCV: 93 fL (ref 79–97)
Platelets: 127 10*3/uL — ABNORMAL LOW (ref 150–450)
RBC: 5.06 x10E6/uL (ref 4.14–5.80)
RDW: 12.5 % (ref 11.6–15.4)
WBC: 6.1 10*3/uL (ref 3.4–10.8)

## 2020-01-31 ENCOUNTER — Other Ambulatory Visit (HOSPITAL_COMMUNITY)
Admission: RE | Admit: 2020-01-31 | Discharge: 2020-01-31 | Disposition: A | Discharge status: Home / Self Care | Payer: Medicare HMO | Source: Ambulatory Visit | Attending: Cardiovascular Disease | Admitting: Cardiovascular Disease

## 2020-01-31 DIAGNOSIS — Z20822 Contact with and (suspected) exposure to covid-19: Secondary | ICD-10-CM | POA: Insufficient documentation

## 2020-01-31 DIAGNOSIS — Z01818 Encounter for other preprocedural examination: Secondary | ICD-10-CM | POA: Diagnosis not present

## 2020-01-31 LAB — SARS CORONAVIRUS 2 (TAT 6-24 HRS): SARS Coronavirus 2: NEGATIVE

## 2020-02-01 ENCOUNTER — Telehealth: Payer: Self-pay | Admitting: *Deleted

## 2020-02-01 ENCOUNTER — Telehealth: Payer: Self-pay | Admitting: Cardiology

## 2020-02-01 NOTE — Telephone Encounter (Signed)
Pt contacted pre-catheterization scheduled at [facility] for: Thursday February 03, 2020 11:30 AM Verified arrival time and place: [facility]) at: 6 AM-pre-procedure hydration   No solid food after midnight prior to cath, clear liquids until 5 AM day of procedure.  Hold: Lasix-day before and day of procedure-GFR 42 Enalapril-day before and day of procedure-GFR 49  Except hold medications AM meds can be  taken pre-cath with sips of water including: ASA 81 mg   Confirmed patient has responsible adult to drive home post procedure and be with patient first 24 hours after arriving home: yes  You are allowed ONE visitor in the waiting room during the time you are at the hospital for your procedure. Both you and your visitor must wear a mask once you enter the hospital.       COVID-19 Pre-Screening Questions:  . In the past 14 days have you had any symptoms concerning for COVID-19 infection (fever, chills, cough, or new shortness of breath)? no . In the past 14 days have you been around anyone with known Covid 19? no  Reviewed procedure/mask/visitor instructions, COVID-19 questions with patient.

## 2020-02-01 NOTE — Telephone Encounter (Signed)
Patient states he is returning a call to go over his medications with him prior to his procedure on 02/03/20

## 2020-02-01 NOTE — Telephone Encounter (Signed)
Returned patient's call. He states he was returning a call for Janetta but I do not see a phone note for this. He already spoke with Webb Silversmith this morning about pre-procedural instructions including switch medications to hold for the procedure. I reviewed these instructions with him. I am not sure who else called him.  While on the phone, patient expressed concern about appointment on 02/10/2020 for repeat lower extremity ultrasounds (aorta/IVC/iliacs). He states he does not think he will be able to stay NPO until 2pm for this and is requesting an earlier appointment time. I will route message to schedulers to see if they can help with this.  Darreld Mclean, PA-C 02/01/2020 5:32 PM

## 2020-02-03 ENCOUNTER — Other Ambulatory Visit: Payer: Self-pay

## 2020-02-03 ENCOUNTER — Encounter (HOSPITAL_COMMUNITY)
Admission: RE | Disposition: A | Discharge status: Home / Self Care | Payer: Self-pay | Source: Home / Self Care | Attending: Cardiovascular Disease

## 2020-02-03 ENCOUNTER — Encounter (HOSPITAL_COMMUNITY): Payer: Self-pay | Admitting: Cardiovascular Disease

## 2020-02-03 ENCOUNTER — Ambulatory Visit (HOSPITAL_COMMUNITY)
Admission: RE | Admit: 2020-02-03 | Discharge: 2020-02-04 | Disposition: A | Discharge status: Home / Self Care | Payer: Medicare HMO | Attending: Cardiovascular Disease | Admitting: Cardiovascular Disease

## 2020-02-03 DIAGNOSIS — E785 Hyperlipidemia, unspecified: Secondary | ICD-10-CM | POA: Diagnosis not present

## 2020-02-03 DIAGNOSIS — Z7982 Long term (current) use of aspirin: Secondary | ICD-10-CM | POA: Diagnosis not present

## 2020-02-03 DIAGNOSIS — Z79899 Other long term (current) drug therapy: Secondary | ICD-10-CM | POA: Insufficient documentation

## 2020-02-03 DIAGNOSIS — E1122 Type 2 diabetes mellitus with diabetic chronic kidney disease: Secondary | ICD-10-CM | POA: Diagnosis not present

## 2020-02-03 DIAGNOSIS — K219 Gastro-esophageal reflux disease without esophagitis: Secondary | ICD-10-CM | POA: Diagnosis not present

## 2020-02-03 DIAGNOSIS — E1151 Type 2 diabetes mellitus with diabetic peripheral angiopathy without gangrene: Secondary | ICD-10-CM | POA: Diagnosis present

## 2020-02-03 DIAGNOSIS — I129 Hypertensive chronic kidney disease with stage 1 through stage 4 chronic kidney disease, or unspecified chronic kidney disease: Secondary | ICD-10-CM | POA: Diagnosis not present

## 2020-02-03 DIAGNOSIS — N183 Chronic kidney disease, stage 3 unspecified: Secondary | ICD-10-CM | POA: Insufficient documentation

## 2020-02-03 DIAGNOSIS — Z87891 Personal history of nicotine dependence: Secondary | ICD-10-CM | POA: Insufficient documentation

## 2020-02-03 DIAGNOSIS — I6529 Occlusion and stenosis of unspecified carotid artery: Secondary | ICD-10-CM | POA: Diagnosis not present

## 2020-02-03 DIAGNOSIS — I739 Peripheral vascular disease, unspecified: Secondary | ICD-10-CM | POA: Diagnosis present

## 2020-02-03 DIAGNOSIS — I10 Essential (primary) hypertension: Secondary | ICD-10-CM | POA: Diagnosis present

## 2020-02-03 DIAGNOSIS — Z8249 Family history of ischemic heart disease and other diseases of the circulatory system: Secondary | ICD-10-CM | POA: Insufficient documentation

## 2020-02-03 DIAGNOSIS — I2510 Atherosclerotic heart disease of native coronary artery without angina pectoris: Secondary | ICD-10-CM | POA: Diagnosis present

## 2020-02-03 DIAGNOSIS — N189 Chronic kidney disease, unspecified: Secondary | ICD-10-CM | POA: Diagnosis present

## 2020-02-03 DIAGNOSIS — Z951 Presence of aortocoronary bypass graft: Secondary | ICD-10-CM | POA: Diagnosis not present

## 2020-02-03 DIAGNOSIS — R0989 Other specified symptoms and signs involving the circulatory and respiratory systems: Secondary | ICD-10-CM

## 2020-02-03 DIAGNOSIS — I779 Disorder of arteries and arterioles, unspecified: Secondary | ICD-10-CM | POA: Diagnosis present

## 2020-02-03 DIAGNOSIS — E1140 Type 2 diabetes mellitus with diabetic neuropathy, unspecified: Secondary | ICD-10-CM | POA: Diagnosis present

## 2020-02-03 DIAGNOSIS — I70211 Atherosclerosis of native arteries of extremities with intermittent claudication, right leg: Secondary | ICD-10-CM | POA: Diagnosis not present

## 2020-02-03 HISTORY — PX: PERIPHERAL VASCULAR INTERVENTION: CATH118257

## 2020-02-03 HISTORY — PX: ABDOMINAL AORTAGRAM: SHX5706

## 2020-02-03 HISTORY — PX: ABDOMINAL AORTOGRAM W/LOWER EXTREMITY: CATH118223

## 2020-02-03 LAB — GLUCOSE, CAPILLARY
Glucose-Capillary: 107 mg/dL — ABNORMAL HIGH (ref 70–99)
Glucose-Capillary: 109 mg/dL — ABNORMAL HIGH (ref 70–99)

## 2020-02-03 LAB — POCT ACTIVATED CLOTTING TIME
Activated Clotting Time: 307 seconds
Activated Clotting Time: 252 seconds
Activated Clotting Time: 214 seconds
Activated Clotting Time: 184 seconds

## 2020-02-03 SURGERY — ABDOMINAL AORTOGRAM W/LOWER EXTREMITY
Anesthesia: LOCAL | Laterality: Right

## 2020-02-03 MED ORDER — MIDAZOLAM HCL 2 MG/2ML IJ SOLN
INTRAMUSCULAR | Status: DC | PRN
  Administered 2020-02-03 (×2): 1 mg via INTRAVENOUS

## 2020-02-03 MED ORDER — SODIUM CHLORIDE 0.9% FLUSH: 3.0000 mL | INTRAVENOUS | Status: DC | PRN

## 2020-02-03 MED ORDER — AMLODIPINE BESYLATE 2.5 MG PO TABS
2.5000 mg | ORAL_TABLET | Freq: Every day | ORAL | Status: DC
  Administered 2020-02-04: 2.5 mg via ORAL
  Filled 2020-02-03 (×2): qty 1

## 2020-02-03 MED ORDER — ASPIRIN EC 81 MG PO TBEC
81.0000 mg | DELAYED_RELEASE_TABLET | Freq: Every day | ORAL | Status: DC
  Filled 2020-02-03: qty 1

## 2020-02-03 MED ORDER — NITROGLYCERIN IN D5W 200-5 MCG/ML-% IV SOLN
INTRAVENOUS | Status: AC
  Filled 2020-02-03: qty 250

## 2020-02-03 MED ORDER — MORPHINE SULFATE (PF) 2 MG/ML IV SOLN: 2.0000 mg | INTRAVENOUS | Status: DC | PRN

## 2020-02-03 MED ORDER — CLOPIDOGREL BISULFATE 75 MG PO TABS
75.0000 mg | ORAL_TABLET | Freq: Every day | ORAL | Status: DC
  Administered 2020-02-04: 75 mg via ORAL
  Filled 2020-02-03: qty 1

## 2020-02-03 MED ORDER — HYDRALAZINE HCL 20 MG/ML IJ SOLN: 5.0000 mg | INTRAMUSCULAR | Status: DC | PRN

## 2020-02-03 MED ORDER — SODIUM CHLORIDE 0.9% FLUSH
3.0000 mL | Freq: Two times a day (BID) | INTRAVENOUS | Status: DC
  Administered 2020-02-04: 3 mL via INTRAVENOUS

## 2020-02-03 MED ORDER — ACETAMINOPHEN 325 MG PO TABS
650.0000 mg | ORAL_TABLET | ORAL | Status: DC | PRN
  Administered 2020-02-03 – 2020-02-04 (×2): 650 mg via ORAL
  Filled 2020-02-03 (×2): qty 2

## 2020-02-03 MED ORDER — FENTANYL CITRATE (PF) 100 MCG/2ML IJ SOLN
INTRAMUSCULAR | Status: DC | PRN
  Administered 2020-02-03 (×2): 25 ug via INTRAVENOUS

## 2020-02-03 MED ORDER — ENALAPRIL MALEATE 5 MG PO TABS
10.0000 mg | ORAL_TABLET | Freq: Two times a day (BID) | ORAL | Status: DC
  Administered 2020-02-04: 10 mg via ORAL
  Filled 2020-02-03 (×3): qty 2

## 2020-02-03 MED ORDER — LIDOCAINE HCL (PF) 1 % IJ SOLN
INTRAMUSCULAR | Status: AC
  Filled 2020-02-03: qty 30

## 2020-02-03 MED ORDER — CLOPIDOGREL BISULFATE 300 MG PO TABS
ORAL_TABLET | ORAL | Status: AC
  Filled 2020-02-03: qty 1

## 2020-02-03 MED ORDER — NITROGLYCERIN 1 MG/10 ML FOR IR/CATH LAB
INTRA_ARTERIAL | Status: AC
  Filled 2020-02-03: qty 10

## 2020-02-03 MED ORDER — HEPARIN (PORCINE) IN NACL 1000-0.9 UT/500ML-% IV SOLN
INTRAVENOUS | Status: AC
  Filled 2020-02-03: qty 500

## 2020-02-03 MED ORDER — SODIUM CHLORIDE 0.9 % IV SOLN: 250.0000 mL | INTRAVENOUS | Status: DC | PRN

## 2020-02-03 MED ORDER — VERAPAMIL HCL 2.5 MG/ML IV SOLN
INTRAVENOUS | Status: AC
  Filled 2020-02-03: qty 2

## 2020-02-03 MED ORDER — FENTANYL CITRATE (PF) 100 MCG/2ML IJ SOLN
INTRAMUSCULAR | Status: AC
  Filled 2020-02-03: qty 2

## 2020-02-03 MED ORDER — TRAMADOL HCL 50 MG PO TABS: 50.0000 mg | ORAL_TABLET | Freq: Three times a day (TID) | ORAL | Status: DC | PRN

## 2020-02-03 MED ORDER — VIPERSLIDE LUBRICANT OPTIME: TOPICAL | Status: DC | PRN

## 2020-02-03 MED ORDER — ONDANSETRON HCL 4 MG/2ML IJ SOLN: 4.0000 mg | Freq: Four times a day (QID) | INTRAMUSCULAR | Status: DC | PRN

## 2020-02-03 MED ORDER — CLOPIDOGREL BISULFATE 300 MG PO TABS
ORAL_TABLET | ORAL | Status: DC | PRN
  Administered 2020-02-03: 300 mg via ORAL

## 2020-02-03 MED ORDER — PANTOPRAZOLE SODIUM 40 MG PO TBEC
40.0000 mg | DELAYED_RELEASE_TABLET | Freq: Every day | ORAL | Status: DC
  Administered 2020-02-04: 40 mg via ORAL
  Filled 2020-02-03 (×2): qty 1

## 2020-02-03 MED ORDER — LIDOCAINE HCL (PF) 1 % IJ SOLN
INTRAMUSCULAR | Status: DC | PRN
  Administered 2020-02-03: 30 mL via INTRADERMAL

## 2020-02-03 MED ORDER — HEPARIN SODIUM (PORCINE) 1000 UNIT/ML IJ SOLN
INTRAMUSCULAR | Status: DC | PRN
  Administered 2020-02-03: 9000 [IU] via INTRAVENOUS

## 2020-02-03 MED ORDER — HEPARIN (PORCINE) IN NACL 1000-0.9 UT/500ML-% IV SOLN
INTRAVENOUS | Status: DC | PRN
  Administered 2020-02-03 (×2): 500 mL

## 2020-02-03 MED ORDER — SODIUM CHLORIDE 0.9 % WEIGHT BASED INFUSION
3.0000 mL/kg/h | INTRAVENOUS | Status: DC
  Administered 2020-02-03: 3 mL/kg/h via INTRAVENOUS

## 2020-02-03 MED ORDER — SODIUM CHLORIDE 0.9 % IV SOLN
INTRAVENOUS | Status: AC | PRN
  Administered 2020-02-03: 250 mL via INTRAVENOUS

## 2020-02-03 MED ORDER — IODIXANOL 320 MG/ML IV SOLN
INTRAVENOUS | Status: DC | PRN
  Administered 2020-02-03: 160 mL

## 2020-02-03 MED ORDER — LABETALOL HCL 5 MG/ML IV SOLN: 10.0000 mg | INTRAVENOUS | Status: DC | PRN

## 2020-02-03 MED ORDER — FUROSEMIDE 20 MG PO TABS
20.0000 mg | ORAL_TABLET | Freq: Every day | ORAL | Status: DC
  Administered 2020-02-04: 20 mg via ORAL
  Filled 2020-02-03 (×2): qty 1

## 2020-02-03 MED ORDER — SODIUM CHLORIDE 0.9 % IV SOLN: INTRAVENOUS | Status: AC

## 2020-02-03 MED ORDER — ASPIRIN 81 MG PO CHEW: 162.0000 mg | CHEWABLE_TABLET | Freq: Every day | ORAL | Status: DC

## 2020-02-03 MED ORDER — SODIUM CHLORIDE 0.9 % WEIGHT BASED INFUSION
1.0000 mL/kg/h | INTRAVENOUS | Status: DC
  Administered 2020-02-03: 1 mL/kg/h via INTRAVENOUS

## 2020-02-03 MED ORDER — ASPIRIN 81 MG PO CHEW: 81.0000 mg | CHEWABLE_TABLET | ORAL | Status: DC

## 2020-02-03 MED ORDER — HEPARIN SODIUM (PORCINE) 1000 UNIT/ML IJ SOLN
INTRAMUSCULAR | Status: AC
  Filled 2020-02-03: qty 1

## 2020-02-03 MED ORDER — ATORVASTATIN CALCIUM 40 MG PO TABS
40.0000 mg | ORAL_TABLET | Freq: Every day | ORAL | Status: DC
  Administered 2020-02-03: 40 mg via ORAL
  Filled 2020-02-03: qty 1

## 2020-02-03 MED ORDER — MIDAZOLAM HCL 2 MG/2ML IJ SOLN
INTRAMUSCULAR | Status: AC
  Filled 2020-02-03: qty 2

## 2020-02-03 MED ORDER — NITROGLYCERIN 1 MG/10 ML FOR IR/CATH LAB
INTRA_ARTERIAL | Status: DC | PRN
  Administered 2020-02-03: 200 ug via INTRA_ARTERIAL

## 2020-02-03 MED ORDER — METOPROLOL TARTRATE 100 MG PO TABS
100.0000 mg | ORAL_TABLET | Freq: Two times a day (BID) | ORAL | Status: DC
  Administered 2020-02-03: 100 mg via ORAL
  Filled 2020-02-03 (×2): qty 1

## 2020-02-03 SURGICAL SUPPLY — 25 items
BALLN MUSTANG 5.0X20 75 (BALLOONS) ×3
BALLOON MUSTANG 5.0X20 75 (BALLOONS) ×2 IMPLANT
CATH ANGIO 5F PIGTAIL 65CM (CATHETERS) ×3 IMPLANT
CATH STRAIGHT 5FR 65CM (CATHETERS) ×3 IMPLANT
DEVICE CONTINUOUS FLUSH (MISCELLANEOUS) ×3 IMPLANT
DIAMONDBACK SOLID OAS 2.0MM (CATHETERS) ×3
GUIDEWIRE ANGLED .035X150CM (WIRE) ×6 IMPLANT
KIT ENCORE 26 ADVANTAGE (KITS) ×3 IMPLANT
KIT PV (KITS) ×3 IMPLANT
LUBRICANT VIPERSLIDE CORONARY (MISCELLANEOUS) ×3 IMPLANT
SHEATH BRITE TIP 7FR 35CM (SHEATH) ×3 IMPLANT
SHEATH PINNACLE 5F 10CM (SHEATH) ×3 IMPLANT
SHEATH PINNACLE 7F 10CM (SHEATH) ×3 IMPLANT
SHEATH PROBE COVER 6X72 (BAG) ×3 IMPLANT
STENT VIABAHN 8X39X80 VBX (Permanent Stent) ×3 IMPLANT
STOPCOCK MORSE 400PSI 3WAY (MISCELLANEOUS) ×3 IMPLANT
SYR MEDRAD MARK 7 150ML (SYRINGE) ×3 IMPLANT
SYSTEM DIMNDBCK SLD OAS 2.0MM (CATHETERS) ×2 IMPLANT
TAPE VIPERTRACK RADIOPAQ (MISCELLANEOUS) ×2 IMPLANT
TAPE VIPERTRACK RADIOPAQUE (MISCELLANEOUS) ×3
TRANSDUCER W/STOPCOCK (MISCELLANEOUS) ×3 IMPLANT
TRAY PV CATH (CUSTOM PROCEDURE TRAY) ×3 IMPLANT
TUBING CIL FLEX 10 FLL-RA (TUBING) ×3 IMPLANT
WIRE HITORQ VERSACORE ST 145CM (WIRE) ×3 IMPLANT
WIRE VIPER ADVANCE .017X335CM (WIRE) ×3 IMPLANT

## 2020-02-03 NOTE — Telephone Encounter (Signed)
Attempted to contact patient to re-schedule test. Wife of patient answered and stated the patient was having another procedure done today. She asked [place] to call back tomorrow and speak with the patient himself.

## 2020-02-03 NOTE — Interval H&P Note (Signed)
History and Physical Interval Note:  02/03/2020 12:24 PM  [name] Little  has presented today for surgery, with the diagnosis of abnormal dopplers.  The various methods of treatment have been discussed with the patient and family. After consideration of risks, benefits and other options for treatment, the patient has consented to  Procedure(s): ABDOMINAL AORTOGRAM W/LOWER EXTREMITY (N/A) as a surgical intervention.  The patient's history has been reviewed, patient examined, no change in status, stable for surgery.  I have reviewed the patient's chart and labs.  Questions were answered to the patient's satisfaction.     [name]

## 2020-02-03 NOTE — Progress Notes (Signed)
Site area: Right groin a 7 french arterial sheath was removed  Site Prior to Removal:  Level 0  Pressure Applied For 50 MINUTES    Bedrest Beginning at 1700pm  Manual:   Yes.    Patient Status During Pull:  stable  Post Pull Groin Site:  Level 1- bruise  Post Pull Instructions Given:  Yes.    Post Pull Pulses Present:  Yes.    Dressing Applied:  Yes.    Comments:

## 2020-02-04 ENCOUNTER — Encounter (HOSPITAL_COMMUNITY): Payer: Self-pay | Admitting: Cardiovascular Disease

## 2020-02-04 ENCOUNTER — Other Ambulatory Visit: Payer: Self-pay | Admitting: Cardiovascular Disease

## 2020-02-04 DIAGNOSIS — R0989 Other specified symptoms and signs involving the circulatory and respiratory systems: Secondary | ICD-10-CM

## 2020-02-04 DIAGNOSIS — N183 Chronic kidney disease, stage 3 unspecified: Secondary | ICD-10-CM

## 2020-02-04 DIAGNOSIS — I739 Peripheral vascular disease, unspecified: Secondary | ICD-10-CM

## 2020-02-04 DIAGNOSIS — E1140 Type 2 diabetes mellitus with diabetic neuropathy, unspecified: Secondary | ICD-10-CM | POA: Diagnosis not present

## 2020-02-04 DIAGNOSIS — I10 Essential (primary) hypertension: Secondary | ICD-10-CM

## 2020-02-04 DIAGNOSIS — E782 Mixed hyperlipidemia: Secondary | ICD-10-CM | POA: Diagnosis not present

## 2020-02-04 DIAGNOSIS — E1151 Type 2 diabetes mellitus with diabetic peripheral angiopathy without gangrene: Secondary | ICD-10-CM | POA: Diagnosis not present

## 2020-02-04 LAB — BASIC METABOLIC PANEL
Anion gap: 9 (ref 5–15)
BUN: 16 mg/dL (ref 8–23)
CO2: 22 mmol/L (ref 22–32)
Calcium: 8.8 mg/dL — ABNORMAL LOW (ref 8.9–10.3)
Chloride: 106 mmol/L (ref 98–111)
Creatinine, Ser: 1.36 mg/dL — ABNORMAL HIGH (ref 0.61–1.24)
GFR, Estimated: 53 mL/min — ABNORMAL LOW (ref >60)
Glucose, Bld: 111 mg/dL — ABNORMAL HIGH (ref 70–99)
Potassium: 3.9 mmol/L (ref 3.5–5.1)
Sodium: 137 mmol/L (ref 135–145)

## 2020-02-04 LAB — CBC
HCT: 44 % (ref 39.0–52.0)
Hemoglobin: 15.3 g/dL (ref 13.0–17.0)
MCH: 32.1 pg (ref 26.0–34.0)
MCHC: 34.8 g/dL (ref 30.0–36.0)
MCV: 92.2 fL (ref 80.0–100.0)
Platelets: 136 10*3/uL — ABNORMAL LOW (ref 150–400)
RBC: 4.77 MIL/uL (ref 4.22–5.81)
RDW: 12.2 % (ref 11.5–15.5)
WBC: 7.3 10*3/uL (ref 4.0–10.5)
nRBC: 0 % (ref 0.0–0.2)

## 2020-02-04 MED ORDER — CLOPIDOGREL BISULFATE 75 MG PO TABS
75.0000 mg | ORAL_TABLET | Freq: Every day | ORAL | 3 refills | Status: DC
Start: 2020-02-05 — End: 2021-02-21

## 2020-02-04 MED ORDER — ASPIRIN 81 MG PO TBEC
81.0000 mg | DELAYED_RELEASE_TABLET | Freq: Every day | ORAL | 3 refills | Status: DC
Start: 2020-02-05 — End: 2023-01-10

## 2020-02-04 MED ORDER — METOPROLOL TARTRATE 50 MG PO TABS: 50.0000 mg | ORAL_TABLET | Freq: Two times a day (BID) | ORAL | Status: DC

## 2020-02-04 MED ORDER — METOPROLOL TARTRATE 50 MG PO TABS
50.0000 mg | ORAL_TABLET | Freq: Two times a day (BID) | ORAL | 3 refills | Status: DC
Start: 2020-02-04 — End: 2021-02-21

## 2020-02-04 MED ORDER — PANTOPRAZOLE SODIUM 40 MG PO TBEC
40.0000 mg | DELAYED_RELEASE_TABLET | Freq: Every day | ORAL | 3 refills | Status: DC
Start: 2020-02-05 — End: 2021-02-21

## 2020-02-04 MED FILL — METOPROLOL TARTRATE 50 MG T: 50 | 30 days supply | Qty: 60 | Fill #0

## 2020-02-04 MED FILL — PANTOPRAZOLE SOD DR 40 MG T: 40 | 30 days supply | Qty: 30 | Fill #0

## 2020-02-04 MED FILL — CLOPIDOGREL 75 MG TABLET: 75 | 30 days supply | Qty: 30 | Fill #0

## 2020-02-04 MED FILL — ASPIRIN LOW DOSE 81 MG TBEC: 81 | 90 days supply | Qty: 90 | Fill #0

## 2020-02-04 NOTE — Progress Notes (Signed)
Patient ambulating independently in the hall with a steady gait with NT. Denies any lightheadedness, dizziness, shortness of breath or chest pain. Prior to ambulating heart rate in the low 60's, with ambulation HR elevated to 77 and decreased to low 60's after resting. Patient has not been given metoprolol at this time. Will check with cardiology before medication given so they can evaluate dose.

## 2020-02-04 NOTE — Plan of Care (Signed)
Reviewed AVS with patient with understanding.

## 2020-02-04 NOTE — Discharge Summary (Addendum)
Discharge Summary    Patient ID: [name] Little MRN: [id number]; DOB: 1940/09/30  Admit date: 02/03/2020 Discharge date: 02/04/2020  Primary Care Provider: Colon Branch, MD  Primary Cardiologist: [name], MD  Primary Electrophysiologist:  None   Discharge Diagnoses    Principal Problem:   Peripheral arterial disease [facility]) Active Problems:   Diabetes mellitus with neuropathy [facility])   Hyperlipidemia   Essential hypertension   Coronary atherosclerosis   GERD   RENAL INSUFFICIENCY, CHRONIC   Carotid arterial disease ([place])   Dyslipidemia    Diagnostic Studies/Procedures    Abdominal aortogram 02/03/20: Procedures Performed:               1.  Ultrasound-guided right common femoral access               2.  Abdominal aortogram/bilateral iliac angiogram               3.  Diamondback orbital rotational atherectomy right common iliac artery               4.  VBX covered stenting right common iliac artery   Final Impression: Successful orbital atherectomy followed by VBX covered stenting of a physiologically significant mid right common iliac artery stenosis in the setting of lifestyle limiting claudication.  The patient did receive 300 mg of p.o. Plavix at the end of the case.  The sheath will be removed and pressure held once ACT falls below 170.  Patient will be gently hydrated overnight and discharged home in the morning.  We will get lower extremity arterial Doppler studies of mine with line office next week and I will see him back 1 to 2 weeks thereafter.  _____________   History of Present Illness     [name] Little is a 79 y.o. male with a history of DM, HTN, HLD, and is s/p CABG in 2004 with moderate LV dysfunction. He was referred to Dr. Gwenlyn Found by Dr. Percival Spanish for claudication. During Dr. Kennon Holter evaluation, he denied chest pain or shortness of breath.  His last 2D echo 12/22/2014 revealed an EF of 45 to 50%.  He reported that when he walks uphill he gets  right hip and buttock claudication which is consistent with his recent Doppler studies performed 12/16/2019 revealing a right ABI of 0.78 with a high-frequency signal in his right common iliac artery.    Hospital Course     Consultants: none  PAD He presented for planned abdominal aortogram for right common iliac artery stenosis with lifestyle limiting claudication. He was loaded with 300 mg plavix and treated with orbital atherectomy followed by VBX covered stenting. He tolerated the procedure well. He has ambulated today without issues. Right groin site C/D/I.    PVCs Sinus bradycardia First degree heart block Pt has been on 100 mg lopressor BID for BP from his PCP. Given his heart rates here of 40-60s, will reduce this to 50 mg BID. If he has an increase in PVCs, we can titrate lopressor as needed.   CAD s/p remote CABG (2004) No chest pain. Risk factor management. Continue ASA, BB, statin.   Cardiomyopathy Mildly reduced EF in 2016 - EF 45-50%. Euvolemic.    Hyperlipidemia with LDL goal < 70 06/01/2019: Cholesterol 142; HDL 26.30; LDL Cholesterol 77; Triglycerides 190.0; VLDL 38.0 He has been on 40 mg lipitor. Repeat lipids at next annual exam - if still above 70, would recommend increasing lipitor to 80 mg.    Hypertension Home  medications include 2.5 mg norvasc, 10 mg enalapril, and reduced lopressor to 50 mg BID.   Carotid artery stenosis Mild disease in 2019.   CKD stage Little sCr stable at 1.36 (1.36)    Pt may discharge home after he ambulated with nursing. HR has been in the 40-60s  - pt denies dizziness. I have reduced in lopressor to 50 mg BID. If more pressure support needed, I would first titrate norvasc.    Did the patient have an acute coronary syndrome (MI, NSTEMI, STEMI, etc) this admission?:  No                               Did the patient have a percutaneous coronary intervention (stent / angioplasty)?:  Yes.     Cath/PCI Registry Performance &  Quality Measures: 1. Aspirin prescribed? - Yes 2. ADP Receptor Inhibitor (Plavix/Clopidogrel, Brilinta/Ticagrelor or Effient/Prasugrel) prescribed (includes medically managed patients)? - Yes 3. High Intensity Statin (Lipitor 40-80mg  or Crestor 20-40mg ) prescribed? - Yes 4. For EF <40%, was ACEI/ARB prescribed? - Not Applicable (EF >/= 15%) 5. For EF <40%, Aldosterone Antagonist (Spironolactone or Eplerenone) prescribed? - Not Applicable (EF >/= 17%) 6. Cardiac Rehab Phase II ordered? - Yes       _____________  Discharge Vitals Blood pressure (!) 135/52, pulse 61, temperature 98.1 F (36.7 C), temperature source Oral, resp. rate 18, height 5\' 10"  (1.778 m), weight 87.4 kg, SpO2 93 %.  Filed Weights   02/03/20 0616 02/04/20 0510  Weight: 85.3 kg 87.4 kg   Physical Exam Constitutional:      General: He is not in acute distress.    Appearance: Normal appearance.  Eyes:     Extraocular Movements: Extraocular movements intact.  Neck:     Vascular: No carotid bruit.  Cardiovascular:     Rate and Rhythm: Normal rate and regular rhythm.     Heart sounds: Normal heart sounds.  Pulmonary:     Effort: Pulmonary effort is normal.     Breath sounds: Normal breath sounds. No wheezing.  Abdominal:     General: Bowel sounds are normal.     Palpations: Abdomen is soft.  Musculoskeletal:     Right lower leg: No edema.     Left lower leg: No edema.  Skin:    General: Skin is warm and dry.  Neurological:     Mental Status: He is alert and oriented to person, place, and time.  Psychiatric:        Mood and Affect: Mood normal.    Groin access site C/D/I, some bruising, no hematoma  Labs & Radiologic Studies    CBC Recent Labs    02/04/20 0229  WBC 7.3  HGB 15.3  HCT 44.0  MCV 92.2  PLT 616*   Basic Metabolic Panel Recent Labs    02/04/20 0229  NA 137  K 3.9  CL 106  CO2 22  GLUCOSE 111*  BUN 16  CREATININE 1.36*  CALCIUM 8.8*   Liver Function Tests No results for  input(s): AST, ALT, ALKPHOS, BILITOT, PROT, ALBUMIN in the last 72 hours. No results for input(s): LIPASE, AMYLASE in the last 72 hours. High Sensitivity Troponin:   No results for input(s): TROPONINIHS in the last 720 hours.  BNP Invalid input(s): POCBNP D-Dimer No results for input(s): DDIMER in the last 72 hours. Hemoglobin A1C No results for input(s): HGBA1C in the last 72 hours. Fasting Lipid Panel No results  for input(s): CHOL, HDL, LDLCALC, TRIG, CHOLHDL, LDLDIRECT in the last 72 hours. Thyroid Function Tests No results for input(s): TSH, T4TOTAL, T3FREE, THYROIDAB in the last 72 hours.  Invalid input(s): FREET3 _____________  PERIPHERAL VASCULAR CATHETERIZATION  Result Date: 02/03/2020  [id number] LOCATION:  FACILITY[facility] PHYSICIAN: Nanetta Batty, M.D. [birth year] DATE OF PROCEDURE:  02/03/2020 DATE OF DISCHARGE: PV Angiogram/Intervention History obtained from chart review.[name] Little is a 79 y.o. mildly overweight married Caucasian male with no children who is retired from Clinical cytogeneticist a Chief of Staff.  He was referred by Dr. Antoine Poche, his cardiologist, for evaluation and treatment of symptomatic PAD.  He has a history of diabetes, hypertension hyperlipidemia.  Both his parents had CAD.  He had CABG in 2004 and has moderate LV dysfunction.  He denies chest pain or shortness of breath.  His last 2D echo 12/22/2014 revealed an EF of 45 to 50%.  He tells me that when he walks uphill he gets right hip and buttock claudication which is consistent with his recent Doppler studies performed 12/16/2019 revealing a right ABI of 0.78 with a high-frequency signal in his right common iliac artery. Pre Procedure Diagnosis: Peripheral arterial disease Post Procedure Diagnosis: Peripheral arterial disease Operators: Dr. Nanetta Batty Procedures Performed:  1.  Ultrasound-guided right common femoral access  2.  Abdominal aortogram/bilateral iliac angiogram  3.  Diamondback orbital  rotational atherectomy right common iliac artery  4.  VBX covered stenting right common iliac artery PROCEDURE DESCRIPTION: The patient was brought to the second floor [place] [facility] cath lab in the the postabsorptive state. He was premedicated with IV Versed and fentanyl. His right groin was prepped and shaved in usual sterile fashion. Xylocaine 1% was used for local anesthesia. A 5 French sheath was inserted into the right common iliac artery using standard Seldinger technique.  Ultrasound was used to identify the right common femoral artery and obtained access.  A digital image was captured and placed the patient's chart.  A 5 French pigtail catheters placed the distal abdominal aorta.  Distal abdominal aortography and bilateral iliac angiography were performed in the AP and oblique views.  Omnipaque dye was used for the entirety of the case.  Retrograde aortic pressures monitored during the case.  A 5 French endhole catheter was placed across the right common iliac artery stenosis and pullback rating was performed after administration of 200 mcg of intra-arterial nitroglycerin via the SideArm sheath.  There is a gradient noted.  Angiographic Data: 1: Abdominal aortic-moderately atherosclerotic and fluoroscopically calcified 2: Left lower extremity-left iliac system was free of significant disease 3: Right lower extremity-calcified 60 to 70% mid right common iliac artery stenosis with a 50 mm pullback gradient.   Mr. Corro has a physiologist the significant calcified mid right common iliac artery stenosis.  We will proceed with orbital atherectomy followed by VBX covered stenting. Procedure Description: The 5 French sheath was exchanged over an 035 versa core wire for a 7 [place] Brite tip sheath.  The patient received 9 calciums of heparin with an ACT of 307.  Total contrast given to the patient was 160 cc.  I placed a Viper wire in the mid abdominal aorta and performed orbital atherectomy with a  2 mm bur up to 120,000 RPMs using CXI.  Following this I predilated the lesion in the right common iliac artery with a 5 mm x 2 cm balloon and stented with a 8 mm x 39 mm long VBX covered stent deployed at  10 to 12 atm resulting reduction of a 60% calcified mid right common iliac artery stenosis to 0% residual.  The patient tolerated the procedure well.  There is no evidence of dissection or perforation.  The bright tip sheath was exchanged over an 035 wire for short 7 French sheath.  A right common femoral angiogram was performed and unfortunately Perclose could not be performed because of proximity to the takeoff of the profunda artery. Final Impression: Successful orbital atherectomy followed by VBX covered stenting of a physiologically significant mid right common iliac artery stenosis in the setting of lifestyle limiting claudication.  The patient did receive 300 mg of p.o. Plavix at the end of the case.  The sheath will be removed and pressure held once ACT falls below 170.  Patient will be gently hydrated overnight and discharged home in the morning.  We will get lower extremity arterial Doppler studies of mine with line office next week and I will see him back 1 to 2 weeks thereafter. [name]. MD, [facility] 02/03/2020 1:52 PM   Disposition   Pt is being discharged home today in good condition.  Follow-up Plans & Appointments     Follow-up Information    Lorretta Harp, MD Follow up on 02/16/2020.   Specialties: Cardiology, Radiology Why: 11:45 am Contact information: [address] [place] [address] [place] Follow up on 02/10/2020.   Specialty: Cardiology Why: 2:00 pm Contact information: [place] [phone]             Discharge Instructions    Diet - low sodium heart healthy   Complete by: As directed    Discharge instructions   Complete by: As directed     No driving for 1 week. No lifting over 5 lbs for 1 week. No sexual activity for 1 week. Keep procedure site clean & dry. If you notice increased pain, swelling, bleeding or pus, call/return!  You may shower, but no soaking baths/hot tubs/pools for 1 week.   Increase activity slowly   Complete by: As directed    No wound care   Complete by: As directed       Discharge Medications   Allergies as of 02/04/2020      Reactions   Micardis [telmisartan]    REACTION: diarrhea      Medication List    STOP taking these medications   aspirin 81 MG chewable tablet Replaced by: aspirin 81 MG EC tablet   omeprazole 40 MG capsule Commonly known as: PRILOSEC Replaced by: pantoprazole 40 MG tablet     TAKE these medications   acetaminophen 500 MG tablet Commonly known as: TYLENOL Take 1,000 mg by mouth every 6 (six) hours as needed for mild pain, moderate pain or headache (Allergies).   amLODipine 2.5 MG tablet Commonly known as: NORVASC TAKE 1 TABLET DAILY   aspirin 81 MG EC tablet Take 1 tablet (81 mg total) by mouth daily. Swallow whole. Start taking on: February 05, 2020 Replaces: aspirin 81 MG chewable tablet   atorvastatin 40 MG tablet Commonly known as: LIPITOR Take 1 tablet (40 mg total) by mouth at bedtime.   azelastine 0.1 % nasal spray Commonly known as: ASTELIN Place 2 sprays into both nostrils at bedtime as needed for rhinitis or allergies. What changed: when to take this   clopidogrel 75 MG tablet Commonly known as: PLAVIX Take 1 tablet (75  mg total) by mouth daily with breakfast. Start taking on: February 05, 2020   CoQ10 400 MG Caps Take 400 mg by mouth daily.   enalapril 10 MG tablet Commonly known as: VASOTEC TAKE 1 TABLET TWICE A DAY   fish oil-omega-3 fatty acids 1000 MG capsule Take 1 g by mouth daily.   FLAX SEED OIL PO Take 1,300 mg by mouth daily at 12 noon. Omega 3   fluticasone 50 MCG/ACT nasal spray Commonly known as: FLONASE Place 1 spray  into both nostrils daily.   furosemide 20 MG tablet Commonly known as: LASIX Take 1 tablet (20 mg total) by mouth daily.   metoprolol tartrate 50 MG tablet Commonly known as: LOPRESSOR Take 1 tablet (50 mg total) by mouth 2 (two) times daily. What changed:   medication strength  how much to take   pantoprazole 40 MG tablet Commonly known as: PROTONIX Take 1 tablet (40 mg total) by mouth daily. Start taking on: February 05, 2020 Replaces: omeprazole 40 MG capsule   Vitamin D3 25 MCG tablet Commonly known as: Vitamin D Take 1,000 Units by mouth daily.          Outstanding Labs/Studies   Follow up has been arranged  Duration of Discharge Encounter   Greater than 30 minutes including physician time.  Signed, Tami Lin Duke, PA 02/04/2020, 11:24 AM  I have examined the patient and reviewed assessment and plan and discussed with patient.  Agree with above as stated.    GEN: Well nourished, well developed, in no acute distress  HEENT: normal  Neck: no JVD, carotid bruits, or masses Cardiac: RRR; no murmurs, rubs, or gallops,no edema  Respiratory:  clear to auscultation bilaterally, normal work of breathing GI: soft, nontender, nondistended,  MS: no deformity or atrophy 3+ PT pulses bilaterally; no right groin hematoma, mild bruising present Skin: warm and dry, no rash Neuro:  Strength and sensation are intact Psych: euthymic mood, full affect  Labs stable. Continue DAPT given peripheral stent.   Larae Grooms

## 2020-02-09 ENCOUNTER — Other Ambulatory Visit: Payer: Self-pay | Admitting: Cardiology

## 2020-02-09 ENCOUNTER — Other Ambulatory Visit: Payer: Self-pay | Admitting: Internal Medicine

## 2020-02-10 ENCOUNTER — Ambulatory Visit (HOSPITAL_BASED_OUTPATIENT_CLINIC_OR_DEPARTMENT_OTHER)
Admission: RE | Admit: 2020-02-10 | Discharge: 2020-02-10 | Disposition: A | Discharge status: Home / Self Care | Payer: Medicare HMO | Source: Ambulatory Visit | Attending: Cardiology | Admitting: Cardiology

## 2020-02-10 ENCOUNTER — Other Ambulatory Visit (HOSPITAL_COMMUNITY): Payer: Self-pay | Admitting: Cardiovascular Disease

## 2020-02-10 ENCOUNTER — Ambulatory Visit (HOSPITAL_COMMUNITY)
Admission: RE | Admit: 2020-02-10 | Discharge: 2020-02-10 | Disposition: A | Discharge status: Home / Self Care | Payer: Medicare HMO | Source: Ambulatory Visit | Attending: Cardiology | Admitting: Cardiology

## 2020-02-10 ENCOUNTER — Other Ambulatory Visit: Payer: Self-pay

## 2020-02-10 DIAGNOSIS — I739 Peripheral vascular disease, unspecified: Secondary | ICD-10-CM

## 2020-02-10 DIAGNOSIS — Z95820 Peripheral vascular angioplasty status with implants and grafts: Secondary | ICD-10-CM | POA: Diagnosis not present

## 2020-02-10 DIAGNOSIS — Z95828 Presence of other vascular implants and grafts: Secondary | ICD-10-CM

## 2020-02-10 DIAGNOSIS — R0989 Other specified symptoms and signs involving the circulatory and respiratory systems: Secondary | ICD-10-CM | POA: Insufficient documentation

## 2020-02-15 ENCOUNTER — Ambulatory Visit: Payer: Medicare HMO | Admitting: Internal Medicine

## 2020-02-16 ENCOUNTER — Encounter: Payer: Self-pay | Admitting: Cardiovascular Disease

## 2020-02-16 ENCOUNTER — Ambulatory Visit: Payer: Medicare HMO | Admitting: Cardiovascular Disease

## 2020-02-16 ENCOUNTER — Other Ambulatory Visit: Payer: Self-pay

## 2020-02-16 VITALS — BP 142/62 | HR 60 | Ht 70.0 in | Wt 196.0 lb

## 2020-02-16 DIAGNOSIS — I739 Peripheral vascular disease, unspecified: Secondary | ICD-10-CM

## 2020-02-16 NOTE — Progress Notes (Signed)
02/16/2020 [name] Little   October 17, 1940  [id number]  Primary Physician Colon Branch, MD Primary Cardiologist: Lorretta Harp MD Garret Reddish, [place], [place]  HPI:  [name] Little is a 79 y.o.  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  He was referred by Dr. Percival Spanish, his cardiologist, for evaluation and treatment of symptomatic PAD.  I last saw him in the office 01/25/2020. He has a history of diabetes, hypertension hyperlipidemia.  Both his parents had CAD.  He had CABG in 2004 and has moderate LV dysfunction.  He denies chest pain or shortness of breath.  His last 2D echo 12/22/2014 revealed an EF of 45 to 50%.  He tells me that when he walks uphill he gets right hip and buttock claudication which is consistent with his recent Doppler studies performed 12/16/2019 revealing a right ABI of 0.78 with a high-frequency signal in his right common iliac artery.  I performed peripheral angiography on him 02/03/2020 revealing a 60 to 70% calcified mid right common iliac artery stenosis with a 50 mm gradient.  I performed orbital atherectomy followed by VBX covered stenting with excellent result.  He was discharged home the following day.  His follow-up Dopplers performed 02/10/2020 revealed a normal right ABI with normal velocities.  His claudication has completely resolved.   Current Meds  Medication Sig  . acetaminophen (TYLENOL) 500 MG tablet Take 1,000 mg by mouth every 6 (six) hours as needed for mild pain, moderate pain or headache (Allergies).   Marland Kitchen amLODipine (NORVASC) 2.5 MG tablet TAKE 1 TABLET DAILY (Patient taking differently: Take 2.5 mg by mouth daily.)  . aspirin EC 81 MG EC tablet Take 1 tablet (81 mg total) by mouth daily. Swallow whole.  Marland Kitchen atorvastatin (LIPITOR) 40 MG tablet Take 1 tablet (40 mg total) by mouth at bedtime.  Marland Kitchen azelastine (ASTELIN) 0.1 % nasal spray Place 2 sprays into both nostrils at  bedtime as needed for rhinitis or allergies. (Patient taking differently: Place 2 sprays into both nostrils at bedtime.)  . clopidogrel (PLAVIX) 75 MG tablet Take 1 tablet (75 mg total) by mouth daily with breakfast.  . Coenzyme Q10 (COQ10) 400 MG CAPS Take 400 mg by mouth daily.   . enalapril (VASOTEC) 10 MG tablet TAKE 1 TABLET BY MOUTH 2 TIMES DAILY  . fish oil-omega-3 fatty acids 1000 MG capsule Take 1 g by mouth daily.  . Flaxseed, Linseed, (FLAX SEED OIL PO) Take 1,300 mg by mouth daily at 12 noon. Omega 3  . fluticasone (FLONASE) 50 MCG/ACT nasal spray Place 1 spray into both nostrils daily.   . furosemide (LASIX) 20 MG tablet Take 1 tablet (20 mg total) by mouth daily.  . metoprolol tartrate (LOPRESSOR) 50 MG tablet Take 1 tablet (50 mg total) by mouth 2 (two) times daily.  . pantoprazole (PROTONIX) 40 MG tablet Take 1 tablet (40 mg total) by mouth daily.  . Vitamin D3 (VITAMIN D) 25 MCG tablet Take 1,000 Units by mouth daily.   Current Facility-Administered Medications for the 02/16/20 encounter (Office Visit) with Lorretta Harp, MD  Medication  . sodium chloride flush (NS) 0.9 % injection 3 mL     Allergies  Allergen Reactions  . Micardis [Telmisartan]     REACTION: diarrhea    Social History   Socioeconomic History  . Marital status: Married    Spouse name: Not on file  . Number  of children: 0  . Years of education: Not on file  . Highest education level: Not on file  Occupational History  . Occupation: retired Freight forwarder   Tobacco Use  . Smoking status: Former Smoker    Types: Cigarettes    Quit date: 12/16/1976    Years since quitting: 43.1  . Smokeless tobacco: Never Used  Vaping Use  . Vaping Use: Never used  Substance and Sexual Activity  . Alcohol use: No    Alcohol/week: 0.0 standard drinks  . Drug use: No  . Sexual activity: Not on file  Other Topics Concern  . Not on file  Social History Narrative   Married , lives w/ wife , no children          Social Determinants of Health   Financial Resource Strain: Not on file  Food Insecurity: Not on file  Transportation Needs: Not on file  Physical Activity: Not on file  Stress: Not on file  Social Connections: Not on file  Intimate Partner Violence: Not on file     Review of Systems: General: negative for chills, fever, night sweats or weight changes.  Cardiovascular: negative for chest pain, dyspnea on exertion, edema, orthopnea, palpitations, paroxysmal nocturnal dyspnea or shortness of breath Dermatological: negative for rash Respiratory: negative for cough or wheezing Urologic: negative for hematuria Abdominal: negative for nausea, vomiting, diarrhea, bright red blood per rectum, melena, or hematemesis Neurologic: negative for visual changes, syncope, or dizziness All other systems reviewed and are otherwise negative except as noted above.    Blood pressure (!) 142/62, pulse 60, height 5\' 10"  (1.778 m), weight 196 lb (88.9 kg).  General appearance: alert and no distress Neck: no adenopathy, no carotid bruit, no JVD, supple, symmetrical, trachea midline and thyroid not enlarged, symmetric, no tenderness/mass/nodules Lungs: clear to auscultation bilaterally Heart: regular rate and rhythm, S1, S2 normal, no murmur, click, rub or gallop Extremities: extremities normal, atraumatic, no cyanosis or edema Pulses: 2+ and symmetric Skin: Skin color, texture, turgor normal. No rashes or lesions Neurologic: Alert and oriented X 3, normal strength and tone. Normal symmetric reflexes. Normal coordination and gait  EKG not performed today  ASSESSMENT AND PLAN:   Peripheral arterial disease (HCC) History of PAD status post right external iliac artery orbital atherectomy, PTA and covered stenting by myself 02/03/2020 because of right hip buttock and lower extremity claudication.  His follow-up Doppler studies performed 02/10/2020 revealed abnormal right ABI with normal frequencies.  His  symptoms have completely resolved.      Lorretta Harp MD FACP,FACC,FAHA, [facility] 02/16/2020 12:05 PM

## 2020-02-16 NOTE — Patient Instructions (Signed)
Medication Instructions:  Your physician recommends that you continue on your current medications as directed. Please refer to the Current Medication list given to you today.   *If you need a refill on your cardiac medications before your next appointment, please call your pharmacy*  Lab Work: NONE   Testing/Procedures: THE OFFICE WILL CALL YOU TO SCHEDULE YOUR 1 YEAR FOLLOW UP STUDIES   Follow-Up: At [facility] [place], you and your health needs are our priority.  As part of our continuing mission to provide you with exceptional heart care, we have created designated Provider Care Teams.  These Care Teams include your primary Cardiologist (physician) and Advanced Practice Providers (APPs -  Physician Assistants and Nurse Practitioners) who all work together to provide you with the care you need, when you need it.  We recommend signing up for the patient portal called "MyChart".  Sign up information is provided on this After Visit Summary.  MyChart is used to connect with patients for Virtual Visits (Telemedicine).  Patients are able to view lab/test results, encounter notes, upcoming appointments, etc.  Non-urgent messages can be sent to your provider as well.   To learn more about what you can do with MyChart, go to [email or web address].    Your next appointment:   12 month(s)  You will receive a reminder letter in the mail two months in advance. If you don't receive a letter, please call our office to schedule the follow-up appointment.  The format for your next appointment:   In Person  Provider:   DR Gwenlyn Found

## 2020-02-16 NOTE — Assessment & Plan Note (Signed)
History of PAD status post right external iliac artery orbital atherectomy, PTA and covered stenting by myself 02/03/2020 because of right hip buttock and lower extremity claudication.  His follow-up Doppler studies performed 02/10/2020 revealed abnormal right ABI with normal frequencies.  His symptoms have completely resolved.

## 2020-02-22 ENCOUNTER — Ambulatory Visit: Payer: Medicare HMO | Admitting: Internal Medicine

## 2020-02-23 ENCOUNTER — Telehealth: Payer: Self-pay | Admitting: Internal Medicine

## 2020-02-23 NOTE — Telephone Encounter (Signed)
Okay with me, thank you 

## 2020-02-23 NOTE — Telephone Encounter (Signed)
Patient would like to transfer care to the [facility] location. He states that he's not happy with current physician and would like to see Dr. Ethelene Hal. Please advise if this is okay.  Patients contact info: [phone]

## 2020-02-27 ENCOUNTER — Other Ambulatory Visit: Payer: Self-pay | Admitting: Internal Medicine

## 2020-03-01 ENCOUNTER — Other Ambulatory Visit: Payer: Self-pay | Admitting: Internal Medicine

## 2020-03-20 ENCOUNTER — Telehealth: Payer: Self-pay | Admitting: Pharmacist

## 2020-03-20 NOTE — Progress Notes (Addendum)
Chronic Care Management Pharmacy Assistant   Name: [name] Little  MRN: [id number] DOB: November 18, 1940  Reason for Encounter: HTN Disease State  Patient Questions:  1.  Have you seen any other providers since your last visit? Yes  2.  Any changes in your medicines or health? Yes    PCP : Colon Branch, MD   Their chronic conditions include: Hypertension, Hyperlipidemia/CAD, Diabetes, BPH, GERD, Allergic Rhinitis  Office Visits: None since their last CCM visit with the clinical pharmacist on 12-08-2019.  Consults: 02-16-2020 (Cardio) Patient presented in the office for a follow up. Discussed the results of dopplers performed s/p cardiac cath. Revealed a normal ABO with normal velocities. Claudication has been resolved.  02-10-2020 (Cardio)  HC US AORTA IVC ILIACS DOPPLER and PR DUPLEX LARGE VESSEL(S),COMPLETE  01-25-2020 (Cardio) Patient presented in the office as referral by /dr. Percival Spanish for symptomatic PAD. Patient c/o right hip claudication. After reviewing the results of study, patient decided to continue with cardiac catheterization.  12-16-2019 (Cardio) Lower extremity doppler performed for decreased pulses in feet.  Hospitalizations: 02-03-2020- Cardiac Catheterization at [facility]    Allergies:   Allergies  Allergen Reactions   Micardis [Telmisartan]     REACTION: diarrhea    Medications: Outpatient Encounter Medications as of 03/20/2020  Medication Sig   acetaminophen (TYLENOL) 500 MG tablet Take 1,000 mg by mouth every 6 (six) hours as needed for mild pain, moderate pain or headache (Allergies).    amLODipine (NORVASC) 2.5 MG tablet Take 1 tablet (2.5 mg total) by mouth daily.   aspirin EC 81 MG EC tablet Take 1 tablet (81 mg total) by mouth daily. Swallow whole.   atorvastatin (LIPITOR) 40 MG tablet Take 1 tablet (40 mg total) by mouth at bedtime.   azelastine (ASTELIN) 0.1 % nasal spray Place 2 sprays into both nostrils at bedtime as needed for  rhinitis or allergies. (Patient taking differently: Place 2 sprays into both nostrils at bedtime.)   clopidogrel (PLAVIX) 75 MG tablet Take 1 tablet (75 mg total) by mouth daily with breakfast.   Coenzyme Q10 (COQ10) 400 MG CAPS Take 400 mg by mouth daily.    enalapril (VASOTEC) 10 MG tablet TAKE 1 TABLET BY MOUTH 2 TIMES DAILY   fish oil-omega-3 fatty acids 1000 MG capsule Take 1 g by mouth daily.   Flaxseed, Linseed, (FLAX SEED OIL PO) Take 1,300 mg by mouth daily at 12 noon. Omega 3   fluticasone (FLONASE) 50 MCG/ACT nasal spray Place 1 spray into both nostrils daily.    furosemide (LASIX) 20 MG tablet Take 1 tablet (20 mg total) by mouth daily.   metoprolol tartrate (LOPRESSOR) 50 MG tablet Take 1 tablet (50 mg total) by mouth 2 (two) times daily.   pantoprazole (PROTONIX) 40 MG tablet Take 1 tablet (40 mg total) by mouth daily.   Vitamin D3 (VITAMIN D) 25 MCG tablet Take 1,000 Units by mouth daily.   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   Coronary artery disease involving native coronary artery of native heart without angina pectoris 11/21/2016   Right arm numbness 09/11/2016   PCP  NOTES >>>>>>>>>>>>>>>>>>>>>> 11/02/2014   Fatigue-- sleepy 04/20/2014   Eye fatigue 12/07/2013   Carotid arterial disease ([place]) 10/18/2013   Sinusitis, chronic  (ill defined L face pressure) 02/05/2012   Annual physical exam >>>>>>>>>>>>>>>>>>>>>>>>>>> 08/21/2010   RENAL INSUFFICIENCY, CHRONIC 04/26/2010   ALLERGIC RHINITIS 04/23/2010   NECK MASS 07/25/2009   Diabetes mellitus with neuropathy ([place]) 11/15/2008    THROMBOCYTOPENIA 06/06/2008   HYPOGONADISM, MALE 02/04/2008   Coronary atherosclerosis 06/01/2007   DJD (degenerative joint disease) 06/01/2007   Hyperlipidemia 06/24/2006   Anxiety-depression- insomnia 06/24/2006   Essential hypertension 06/24/2006   GERD 06/24/2006   HEADACHE 06/24/2006    Goals Addressed   None    Reviewed chart prior to disease state call. Spoke with patient regarding BP  Recent Office Vitals: BP Readings from Last 3 Encounters:  02/16/20 (!) 142/62  02/04/20 (!) 135/52  01/25/20 (!) 148/72   Pulse Readings from Last 3 Encounters:  02/16/20 60  02/04/20 61  01/25/20 74    Wt Readings from Last 3 Encounters:  02/16/20 196 lb (88.9 kg)  02/04/20 192 lb 10.9 oz (87.4 kg)  01/25/20 195 lb (88.5 kg)     Kidney Function Lab Results  Component Value Date/Time   CREATININE 1.36 (H) 02/04/2020 02:29 AM   CREATININE 1.36 (H) 01/25/2020 02:26 PM   GFR 44.69 (L) 10/21/2019 10:47 AM   GFRNONAA 53 (L) 02/04/2020 02:29 AM   GFRAA 56 (L) 01/25/2020 02:26 PM    BMP Latest Ref Rng & Units 02/04/2020 01/25/2020 10/21/2019  Glucose 70 - 99 mg/dL 111(H) 121(H) 164(H)  BUN 8 - 23 mg/dL 16 20 27(H)  Creatinine 0.61 - 1.24 mg/dL 1.36(H) 1.36(H) 1.51(H)  BUN/Creat Ratio 10 - 24 - 15 -  Sodium 135 - 145 mmol/L 137 140 139  Potassium 3.5 - 5.1 mmol/L 3.9 5.0 4.3  Chloride 98 - 111 mmol/L 106 99 99  CO2 22 - 32 mmol/L 22 26 30   Calcium 8.9 - 10.3 mg/dL 8.8(L) 9.5 9.5    Current antihypertensive regimen:  Amlodipine 2.5 mg daily Enalapril 10 mg twice daily Metoprolol tartrate 100mg  twice daily Furosemide 20mg  daily  How often are you checking your Blood Pressure? infrequently  Patient was asked to check BP 1-2 times per week.  Current home BP readings: Did not have any to provide. Asked that he begin to check his BP a couple of times a week and record them for future calls.  What recent interventions/DTPs have been made by any provider to improve Blood Pressure  control since last CPP Visit: Aspirin 81 mg and Plavix 75 mg tabs added to medication regimen  Any recent hospitalizations or ED visits since last visit with CPP? Yes   What diet changes have been made to improve Blood Pressure Control?  None  What exercise is being done to improve your Blood Pressure Control?  None  Adherence Review: Is the patient currently on ACE/ARB medication? Yes Enalapril 10 mg daily Does the patient have >5 day gap between last estimated fill dates? No CPP please confirm   Follow-Up:  Pharmacist Review   Fanny Skates, [place] Pharmacist Assistant [phone]  6 minutes spent in review, coordination, and documentation.  Reviewed by: Beverly Milch, PharmD Clinical Pharmacist [facility] [phone]

## 2020-05-12 NOTE — Telephone Encounter (Signed)
Please see message regarding transfer.Marland KitchenMarland KitchenMarland Kitchen

## 2020-05-15 NOTE — Telephone Encounter (Signed)
Okay 

## 2020-06-07 ENCOUNTER — Encounter: Payer: Self-pay | Admitting: Internal Medicine

## 2020-06-22 ENCOUNTER — Other Ambulatory Visit: Payer: Self-pay | Admitting: Internal Medicine

## 2020-06-28 ENCOUNTER — Other Ambulatory Visit: Payer: Self-pay

## 2020-06-29 ENCOUNTER — Encounter: Payer: Self-pay | Admitting: Family Medicine

## 2020-06-29 ENCOUNTER — Ambulatory Visit (INDEPENDENT_AMBULATORY_CARE_PROVIDER_SITE_OTHER): Payer: Medicare HMO | Admitting: Family Medicine

## 2020-06-29 VITALS — BP 151/72 | Temp 97.9°F | Ht 70.0 in | Wt 199.6 lb

## 2020-06-29 DIAGNOSIS — N1832 Chronic kidney disease, stage 3b: Secondary | ICD-10-CM | POA: Diagnosis not present

## 2020-06-29 DIAGNOSIS — E1140 Type 2 diabetes mellitus with diabetic neuropathy, unspecified: Secondary | ICD-10-CM | POA: Diagnosis not present

## 2020-06-29 DIAGNOSIS — I10 Essential (primary) hypertension: Secondary | ICD-10-CM

## 2020-06-29 DIAGNOSIS — E785 Hyperlipidemia, unspecified: Secondary | ICD-10-CM | POA: Diagnosis not present

## 2020-06-29 NOTE — Progress Notes (Signed)
Established Patient Office Visit  Subjective:  Patient ID: [name] Little, male    DOB: March 02, 1941  Age: 79 y.o. MRN: [id number]  CC:  Chief Complaint  Patient presents with  . Transitions Of Care    TOC from Dr. Larose Kells, patient states that he will sometimes have pain in left leg mostly happens in the mornings. Allergies bothering him, neuropathy in toes on both feet patient would like checked.     HPI [name] Little presents for transfer of care and follow-up of type 2 diabetes, hypertension, vitamin D deficiency, vascular disease and elevated cholesterol.  Blood pressure typically runs in the 140s over 70s range on his current regimen.  Last LDL cholesterol 8 months ago was77 .  Hemoglobin A1c was 6.6.  Takes 1000 units of vitamin D daily.  Renal function has been in decline.  He has never taken any medicine for his diabetes he tells me.  Past Medical History:  Diagnosis Date  . Allergic rhinitis   . Anxiety and depression   . Arthritis   . CAD (coronary artery disease)   . Diabetes mellitus   . GERD (gastroesophageal reflux disease)   . Headache(784.0) 06/2003   s/p neuro eval ? migraine  . Hyperlipidemia   . Hyperplastic colon polyp 2008  . Hypertension   . Iron deficiency    h/o  . Ischemic cardiomyopathy    EF had 30-35%. The most recent EF was 50% on echo in september 2009  . PUD (peptic ulcer disease) 7/11   EGD showed few small ulcers, esophagues strecthed  . Vision abnormalities     Past Surgical History:  Procedure Laterality Date  . ABDOMINAL AORTAGRAM  02/03/2020  . ABDOMINAL AORTOGRAM W/LOWER EXTREMITY N/A 02/03/2020   Procedure: ABDOMINAL AORTOGRAM W/LOWER EXTREMITY;  Surgeon: Lorretta Harp, MD;  Location: [facility];  Service: Cardiovascular;  Laterality: N/A;  . CATARACT EXTRACTION Right 07-2012  . CORONARY ARTERY BYPASS GRAFT  2004   LIMA to the LADs, sequentail SVG to intermediated and obtuse marginal, sequential SVG to PDA and  posterolaterla  . INGUINAL HERNIA REPAIR Bilateral   . PERIPHERAL VASCULAR INTERVENTION Right 02/03/2020   Procedure: PERIPHERAL VASCULAR INTERVENTION;  Surgeon: Lorretta Harp, MD;  Location: [facility];  Service: Cardiovascular;  Laterality: Right;  . TONSILLECTOMY      Family History  Problem Relation Age of Onset  . Diabetes Father   . Heart attack Father   . Heart attack Mother   . Cancer Paternal Grandfather        type unknown  . Colon cancer Neg Hx   . Prostate cancer Neg Hx     Social History   Socioeconomic History  . Marital status: Married    Spouse name: Not on file  . Number of children: 0  . Years of education: Not on file  . Highest education level: Not on file  Occupational History  . Occupation: retired Freight forwarder   Tobacco Use  . Smoking status: Former Smoker    Types: Cigarettes    Quit date: 12/16/1976    Years since quitting: 43.5  . Smokeless tobacco: Never Used  Vaping Use  . Vaping Use: Never used  Substance and Sexual Activity  . Alcohol use: No    Alcohol/week: 0.0 standard drinks  . Drug use: No  . Sexual activity: Not on file  Other Topics Concern  . Not on file  Social History Narrative   Married ,  lives w/ wife , no children         Social Determinants of Health   Financial Resource Strain: Not on file  Food Insecurity: Not on file  Transportation Needs: Not on file  Physical Activity: Not on file  Stress: Not on file  Social Connections: Not on file  Intimate Partner Violence: Not on file    Outpatient Medications Prior to Visit  Medication Sig Dispense Refill  . acetaminophen (TYLENOL) 500 MG tablet Take 1,000 mg by mouth every 6 (six) hours as needed for mild pain, moderate pain or headache (Allergies).     Marland Kitchen amLODipine (NORVASC) 2.5 MG tablet Take 1 tablet (2.5 mg total) by mouth daily. 90 tablet 0  . aspirin EC 81 MG EC tablet Take 1 tablet (81 mg total) by mouth daily. Swallow whole. 90 tablet 3  . atorvastatin  (LIPITOR) 40 MG tablet Take 1 tablet (40 mg total) by mouth at bedtime. 90 tablet 0  . azelastine (ASTELIN) 0.1 % nasal spray Place 2 sprays into both nostrils at bedtime as needed for rhinitis or allergies. (Patient taking differently: Place 2 sprays into both nostrils at bedtime.) 30 mL 12  . clopidogrel (PLAVIX) 75 MG tablet Take 1 tablet (75 mg total) by mouth daily with breakfast. 90 tablet 3  . Coenzyme Q10 (COQ10) 400 MG CAPS Take 400 mg by mouth daily.     . enalapril (VASOTEC) 10 MG tablet TAKE 1 TABLET BY MOUTH 2 TIMES DAILY 180 tablet 2  . fish oil-omega-3 fatty acids 1000 MG capsule Take 1 g by mouth daily.    . Flaxseed, Linseed, (FLAX SEED OIL PO) Take 1,300 mg by mouth daily at 12 noon. Omega 3    . fluticasone (FLONASE) 50 MCG/ACT nasal spray Place 1 spray into both nostrils daily.   6  . furosemide (LASIX) 20 MG tablet Take 1 tablet (20 mg total) by mouth daily. 30 tablet 0  . metoprolol tartrate (LOPRESSOR) 50 MG tablet Take 1 tablet (50 mg total) by mouth 2 (two) times daily. 180 tablet 3  . pantoprazole (PROTONIX) 40 MG tablet Take 1 tablet (40 mg total) by mouth daily. 90 tablet 3  . Vitamin D3 (VITAMIN D) 25 MCG tablet Take 1,000 Units by mouth daily.     Facility-Administered Medications Prior to Visit  Medication Dose Route Frequency Provider Last Rate Last Admin  . sodium chloride flush (NS) 0.9 % injection 3 mL  3 mL Intravenous Q12H Lorretta Harp, MD        Allergies  Allergen Reactions  . Micardis [Telmisartan]     REACTION: diarrhea    ROS Review of Systems  Constitutional: Negative.   HENT: Negative.   Eyes: Negative for photophobia and visual disturbance.  Respiratory: Negative.   Cardiovascular: Negative.   Gastrointestinal: Negative.   Endocrine: Negative for polyphagia and polyuria.  Genitourinary: Negative.   Musculoskeletal: Positive for gait problem.  Skin: Negative for pallor and rash.  Neurological: Positive for numbness. Negative for  weakness.  Psychiatric/Behavioral: Negative.       Objective:    Physical Exam Vitals and nursing note reviewed.  Constitutional:      General: He is not in acute distress.    Appearance: Normal appearance. He is not ill-appearing, toxic-appearing or diaphoretic.  HENT:     Head: Normocephalic and atraumatic.     Right Ear: Tympanic membrane, ear canal and external ear normal.     Left Ear: Tympanic membrane, ear canal and  external ear normal.     Mouth/Throat:     Mouth: Mucous membranes are moist.     Pharynx: Oropharynx is clear. No oropharyngeal exudate or posterior oropharyngeal erythema.  Eyes:     General: No scleral icterus.       Right eye: No discharge.        Left eye: No discharge.     Extraocular Movements: Extraocular movements intact.     Conjunctiva/sclera: Conjunctivae normal.     Pupils: Pupils are equal, round, and reactive to light.  Cardiovascular:     Rate and Rhythm: Normal rate and regular rhythm.     Pulses:          Dorsalis pedis pulses are 1+ on the right side and 1+ on the left side.       Posterior tibial pulses are 1+ on the right side and 1+ on the left side.  Pulmonary:     Effort: Pulmonary effort is normal.     Breath sounds: Normal breath sounds.  Musculoskeletal:     Cervical back: No rigidity or tenderness.  Lymphadenopathy:     Cervical: No cervical adenopathy.  Skin:    General: Skin is warm and dry.  Neurological:     Mental Status: He is alert and oriented to person, place, and time.  Psychiatric:        Mood and Affect: Mood normal.        Behavior: Behavior normal.     Diabetic Foot Exam - Simple   Simple Foot Form Diabetic Foot exam was performed with the following findings: Yes 06/29/2020  2:07 PM  Visual Inspection See comments: Yes Sensation Testing See comments: Yes Pulse Check Posterior Tibialis and Dorsalis pulse intact bilaterally: Yes Comments Feet are cavus there are no lesions or rashes.  Sensory to light  touch is diminished at the toes.    BP (!) 151/72   Temp 97.9 F (36.6 C) (Temporal)   Ht 5\' 10"  (1.778 m)   Wt 199 lb 9.6 oz (90.5 kg)   BMI 28.64 kg/m  Wt Readings from Last 3 Encounters:  06/29/20 199 lb 9.6 oz (90.5 kg)  02/16/20 196 lb (88.9 kg)  02/04/20 192 lb 10.9 oz (87.4 kg)     Health Maintenance Due  Topic Date Due  . HEMOGLOBIN A1C  04/22/2020    There are no preventive care reminders to display for this patient.  Lab Results  Component Value Date   TSH 0.50 10/21/2019   Lab Results  Component Value Date   WBC 7.3 02/04/2020   HGB 15.3 02/04/2020   HCT 44.0 02/04/2020   MCV 92.2 02/04/2020   PLT 136 (L) 02/04/2020   Lab Results  Component Value Date   NA 137 02/04/2020   K 3.9 02/04/2020   CO2 22 02/04/2020   GLUCOSE 111 (H) 02/04/2020   BUN 16 02/04/2020   CREATININE 1.36 (H) 02/04/2020   BILITOT 1.2 06/01/2019   ALKPHOS 94 06/01/2019   AST 18 06/01/2019   ALT 21 06/01/2019   PROT 6.7 06/01/2019   ALBUMIN 4.3 06/01/2019   CALCIUM 8.8 (L) 02/04/2020   ANIONGAP 9 02/04/2020   GFR 44.69 (L) 10/21/2019   Lab Results  Component Value Date   CHOL 142 06/01/2019   Lab Results  Component Value Date   HDL 26.30 (L) 06/01/2019   Lab Results  Component Value Date   LDLCALC 77 06/01/2019   Lab Results  Component Value Date   TRIG  190.0 (H) 06/01/2019   Lab Results  Component Value Date   CHOLHDL 5 06/01/2019   Lab Results  Component Value Date   HGBA1C 6.6 (H) 10/21/2019      Assessment & Plan:   Problem List Items Addressed This Visit      Cardiovascular and Mediastinum   Essential hypertension   Relevant Orders   Basic metabolic panel   CBC     Endocrine   Type 2 diabetes mellitus with diabetic neuropathy, without long-term current use of insulin ([place]) - Primary   Relevant Orders   Basic metabolic panel   Hemoglobin A1c   Urinalysis, Routine w reflex microscopic   Microalbumin / creatinine urine ratio      Genitourinary   Stage 3b chronic kidney disease ([place])   Relevant Orders   Basic metabolic panel     Other   Dyslipidemia   Relevant Orders   LDL cholesterol, direct      No orders of the defined types were placed in this encounter.   Follow-up: Return in about 3 months (around 09/28/2020), or Please drink lots of water..   Continue current medications.  We will wait for laboratory results to make any needed adjustments. Libby Maw, MD

## 2020-06-30 LAB — CBC
HCT: 47.5 % (ref 39.0–52.0)
Hemoglobin: 15.4 g/dL (ref 13.0–17.0)
MCHC: 32.5 g/dL (ref 30.0–36.0)
MCV: 93.3 fl (ref 78.0–100.0)
Platelets: 135 10*3/uL — ABNORMAL LOW (ref 150.0–400.0)
RBC: 5.09 Mil/uL (ref 4.22–5.81)
RDW: 15 % (ref 11.5–15.5)
WBC: 6.6 10*3/uL (ref 4.0–10.5)

## 2020-06-30 LAB — URINALYSIS, ROUTINE W REFLEX MICROSCOPIC
Bilirubin Urine: NEGATIVE
Hgb urine dipstick: NEGATIVE
Ketones, ur: NEGATIVE
Leukocytes,Ua: NEGATIVE
Nitrite: NEGATIVE
Specific Gravity, Urine: 1.02 (ref 1.000–1.030)
Total Protein, Urine: NEGATIVE
Urine Glucose: NEGATIVE
Urobilinogen, UA: 0.2 (ref 0.0–1.0)
WBC, UA: NONE SEEN — AB (ref 0–?)
pH: 5 (ref 5.0–8.0)

## 2020-06-30 LAB — BASIC METABOLIC PANEL
BUN: 28 mg/dL — ABNORMAL HIGH (ref 6–23)
CO2: 30 mEq/L (ref 19–32)
Calcium: 9.5 mg/dL (ref 8.4–10.5)
Chloride: 101 mEq/L (ref 96–112)
Creatinine, Ser: 1.53 mg/dL — ABNORMAL HIGH (ref 0.40–1.50)
GFR: 42.64 mL/min — ABNORMAL LOW (ref >60.00)
Glucose, Bld: 109 mg/dL — ABNORMAL HIGH (ref 70–99)
Potassium: 4.7 mEq/L (ref 3.5–5.1)
Sodium: 140 mEq/L (ref 135–145)

## 2020-06-30 LAB — MICROALBUMIN / CREATININE URINE RATIO
Creatinine,U: 103.5 mg/dL
Microalb Creat Ratio: 1 mg/g (ref 0.0–30.0)
Microalb, Ur: 1 mg/dL (ref 0.0–1.9)

## 2020-06-30 LAB — LDL CHOLESTEROL, DIRECT: Direct LDL: 82 mg/dL

## 2020-06-30 LAB — HEMOGLOBIN A1C: Hgb A1c MFr Bld: 7 % — ABNORMAL HIGH (ref 4.6–6.5)

## 2020-07-04 ENCOUNTER — Other Ambulatory Visit: Payer: Self-pay | Admitting: Internal Medicine

## 2020-07-07 ENCOUNTER — Telehealth: Payer: Self-pay | Admitting: Family Medicine

## 2020-07-07 NOTE — Telephone Encounter (Signed)
Pharmacy changed

## 2020-07-07 NOTE — Telephone Encounter (Signed)
FYI:Pt is wanting Dr. Ethelene Hal to know he wants all his scripts to go to [facility].

## 2020-07-10 ENCOUNTER — Other Ambulatory Visit: Payer: Self-pay

## 2020-07-10 MED ORDER — AMLODIPINE BESYLATE 2.5 MG PO TABS: 2.5000 mg | ORAL_TABLET | Freq: Every day | ORAL | 0 refills | Status: DC

## 2020-07-10 MED ORDER — AZELASTINE HCL 0.1 % NA SOLN: 2.0000 | Freq: Every evening | NASAL | 12 refills | Status: DC | PRN

## 2020-07-10 MED ORDER — ATORVASTATIN CALCIUM 40 MG PO TABS: 40.0000 mg | ORAL_TABLET | Freq: Every day | ORAL | 0 refills | Status: DC

## 2020-07-10 MED ORDER — FUROSEMIDE 20 MG PO TABS: 20.0000 mg | ORAL_TABLET | Freq: Every day | ORAL | 0 refills | Status: DC

## 2020-07-12 ENCOUNTER — Ambulatory Visit: Payer: Medicare HMO | Admitting: Cardiovascular Disease

## 2020-07-12 ENCOUNTER — Encounter: Payer: Self-pay | Admitting: Cardiovascular Disease

## 2020-07-12 ENCOUNTER — Other Ambulatory Visit: Payer: Self-pay

## 2020-07-12 VITALS — BP 144/70 | HR 70 | Ht 70.0 in | Wt 202.0 lb

## 2020-07-12 DIAGNOSIS — I2510 Atherosclerotic heart disease of native coronary artery without angina pectoris: Secondary | ICD-10-CM

## 2020-07-12 DIAGNOSIS — E785 Hyperlipidemia, unspecified: Secondary | ICD-10-CM

## 2020-07-12 DIAGNOSIS — I739 Peripheral vascular disease, unspecified: Secondary | ICD-10-CM

## 2020-07-12 NOTE — Assessment & Plan Note (Signed)
History of PAD status post orbital atherectomy, PTA and covered stenting of a highly calcified right common iliac artery stenosis 02/03/2020.  The left iliac was widely patent.  His follow-up Dopplers performed a week later were completely normal.  His claudication has resolved.  We will continue to follow his Doppler studies on an annual basis.

## 2020-07-12 NOTE — Patient Instructions (Signed)
Medication Instructions:  Your physician recommends that you continue on your current medications as directed. Please refer to the Current Medication list given to you today.  *If you need a refill on your cardiac medications before your next appointment, please call your pharmacy*   Testing/Procedures: Dr. Gwenlyn Found has recommended that you have an Ultrasound of your AORTA/IVC/ILIACS.   To prepare for this test:  . No food after 11PM the night before. Water is OK. (Don't drink liquids if you have been instructed not to for ANOTHER test).  . Avoid foods that produce bowel gas, for 24 hours prior to exam (see below). . No breakfast, no chewing gum, no smoking or carbonated beverages. . Patient may take morning medications with water. . Come in for test at least 15 minutes early to register.  Your physician has requested that you have an ankle brachial index (ABI). During this test an ultrasound and blood pressure cuff are used to evaluate the arteries that supply the arms and legs with blood. Allow thirty minutes for this exam. There are no restrictions or special instructions.  These procedures are done at [place]. 2nd Floor. To be done in June 2022.   Follow-Up: At [facility], you and your health needs are our priority.  As part of our continuing mission to provide you with exceptional heart care, we have created designated Provider Care Teams.  These Care Teams include your primary Cardiologist (physician) and Advanced Practice Providers (APPs -  Physician Assistants and Nurse Practitioners) who all work together to provide you with the care you need, when you need it.  We recommend signing up for the patient portal called "MyChart".  Sign up information is provided on this After Visit Summary.  MyChart is used to connect with patients for Virtual Visits (Telemedicine).  Patients are able to view lab/test results, encounter notes, upcoming appointments, etc.  Non-urgent messages  can be sent to your provider as well.   To learn more about what you can do with MyChart, go to [email or web address].    Your next appointment:   No future appointments made at this time. We will see you on an as needed basis.  Provider:   [name], MD

## 2020-07-12 NOTE — Progress Notes (Signed)
07/12/2020 [name] Little   June 22, 1940  [id number]  Primary Physician [name] Maw, MD Primary Cardiologist: [name] Harp MD FACP, [facility], [place], [place]  HPI:  [name] Little is a 80 y.o.  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 He was referred by Dr. Percival Little, his cardiologist, for evaluation and treatment of symptomatic PAD.  I last saw him in the office 01/25/2020.He has a history of diabetes, hypertension hyperlipidemia. Both his parents had CAD. He had CABG in 2004 and has moderate LV dysfunction. He denies chest pain or shortness of breath. His last 2D echo 12/22/2014 revealed an EF of 45 to 50%. He tells me that when he walks uphill he gets right hip and buttock claudication which is consistent with his recent Doppler studies performed 12/16/2019 revealing a right ABI of 0.78 with a high-frequency signal in his right common iliac artery.  I performed peripheral angiography on him 02/03/2020 revealing a 60 to 70% calcified mid right common iliac artery stenosis with a 50 mm gradient.  I performed orbital atherectomy followed by VBX covered stenting with excellent result.  He was discharged home the following day.  His follow-up Dopplers performed 02/10/2020 revealed a normal right ABI with normal velocities.  His claudication has completely resolved.  Since I saw him 6 months ago he continues to deny claudication.  He does have some left groin pain rating down to his foot which sounds more neurologic versus orthopedic but not vascular.    Current Meds  Medication Sig  . acetaminophen (TYLENOL) 500 MG tablet Take 1,000 mg by mouth every 6 (six) hours as needed for mild pain, moderate pain or headache (Allergies).   Marland Kitchen amLODipine (NORVASC) 2.5 MG tablet Take 1 tablet (2.5 mg total) by mouth daily.  Marland Kitchen aspirin EC 81 MG EC tablet Take 1 tablet (81 mg total) by mouth daily. Swallow  whole.  Marland Kitchen atorvastatin (LIPITOR) 40 MG tablet Take 1 tablet (40 mg total) by mouth at bedtime.  Marland Kitchen azelastine (ASTELIN) 0.1 % nasal spray Place 2 sprays into both nostrils at bedtime as needed for rhinitis or allergies.  Marland Kitchen clopidogrel (PLAVIX) 75 MG tablet Take 1 tablet (75 mg total) by mouth daily with breakfast.  . Coenzyme Q10 (COQ10) 400 MG CAPS Take 400 mg by mouth daily.   . enalapril (VASOTEC) 10 MG tablet TAKE 1 TABLET BY MOUTH 2 TIMES DAILY  . fish oil-omega-3 fatty acids 1000 MG capsule Take 1 g by mouth daily.  . Flaxseed, Linseed, (FLAX SEED OIL PO) Take 1,300 mg by mouth daily at 12 noon. Omega 3  . fluticasone (FLONASE) 50 MCG/ACT nasal spray Place 1 spray into both nostrils daily.   . furosemide (LASIX) 20 MG tablet Take 1 tablet (20 mg total) by mouth daily.  . metoprolol tartrate (LOPRESSOR) 50 MG tablet Take 1 tablet (50 mg total) by mouth 2 (two) times daily.  . pantoprazole (PROTONIX) 40 MG tablet Take 1 tablet (40 mg total) by mouth daily.  . Vitamin D3 (VITAMIN D) 25 MCG tablet Take 1,000 Units by mouth daily.   Current Facility-Administered Medications for the 07/12/20 encounter (Office Visit) with [name] Harp, MD  Medication  . sodium chloride flush (NS) 0.9 % injection 3 mL     Allergies  Allergen Reactions  . Micardis [Telmisartan]     REACTION: diarrhea    Social History   Socioeconomic History  .  Marital status: Married    Spouse name: Not on file  . Number of children: 0  . Years of education: Not on file  . Highest education level: Not on file  Occupational History  . Occupation: retired Freight forwarder   Tobacco Use  . Smoking status: Former Smoker    Types: Cigarettes    Quit date: 12/16/1976    Years since quitting: 43.6  . Smokeless tobacco: Never Used  Vaping Use  . Vaping Use: Never used  Substance and Sexual Activity  . Alcohol use: No    Alcohol/week: 0.0 standard drinks  . Drug use: No  . Sexual activity: Not on file  Other Topics  Concern  . Not on file  Social History Narrative   Married , lives w/ wife , no children         Social Determinants of Health   Financial Resource Strain: Not on file  Food Insecurity: Not on file  Transportation Needs: Not on file  Physical Activity: Not on file  Stress: Not on file  Social Connections: Not on file  Intimate Partner Violence: Not on file     Review of Systems: General: negative for chills, fever, night sweats or weight changes.  Cardiovascular: negative for chest pain, dyspnea on exertion, edema, orthopnea, palpitations, paroxysmal nocturnal dyspnea or shortness of breath Dermatological: negative for rash Respiratory: negative for cough or wheezing Urologic: negative for hematuria Abdominal: negative for nausea, vomiting, diarrhea, bright red blood per rectum, melena, or hematemesis Neurologic: negative for visual changes, syncope, or dizziness All other systems reviewed and are otherwise negative except as noted above.    Blood pressure (!) 144/70, pulse 70, height 5\' 10"  (1.778 m), weight 202 lb (91.6 kg).  General appearance: alert and no distress Neck: no adenopathy, no carotid bruit, no JVD, supple, symmetrical, trachea midline and thyroid not enlarged, symmetric, no tenderness/mass/nodules Lungs: clear to auscultation bilaterally Heart: regular rate and rhythm, S1, S2 normal, no murmur, click, rub or gallop Extremities: extremities normal, atraumatic, no cyanosis or edema Pulses: 2+ and symmetric Skin: Skin color, texture, turgor normal. No rashes or lesions Neurologic: Alert and oriented X 3, normal strength and tone. Normal symmetric reflexes. Normal coordination and gait  EKG sinus rhythm at 70 with septal Q waves, occasional PVCs and inferior Q waves poor R wave progression.  I personally reviewed this EKG.  ASSESSMENT AND PLAN:   Peripheral arterial disease ([place]) History of PAD status post orbital atherectomy, PTA and covered stenting of a  highly calcified right common iliac artery stenosis 02/03/2020.  The left iliac was widely patent.  His follow-up Dopplers performed a week later were completely normal.  His claudication has resolved.  We will continue to follow his Doppler studies on an annual basis.      [name] Harp MD FACP,FACC,FAHA, [facility] 07/12/2020 2:43 PM

## 2020-07-17 ENCOUNTER — Telehealth (INDEPENDENT_AMBULATORY_CARE_PROVIDER_SITE_OTHER): Payer: Medicare HMO | Admitting: Family Medicine

## 2020-07-17 ENCOUNTER — Encounter: Payer: Self-pay | Admitting: Family Medicine

## 2020-07-17 VITALS — Ht 70.0 in | Wt 195.0 lb

## 2020-07-17 DIAGNOSIS — M1990 Unspecified osteoarthritis, unspecified site: Secondary | ICD-10-CM | POA: Diagnosis not present

## 2020-07-17 DIAGNOSIS — E1140 Type 2 diabetes mellitus with diabetic neuropathy, unspecified: Secondary | ICD-10-CM | POA: Diagnosis not present

## 2020-07-17 DIAGNOSIS — N1832 Chronic kidney disease, stage 3b: Secondary | ICD-10-CM | POA: Diagnosis not present

## 2020-07-17 NOTE — Progress Notes (Signed)
Established Patient Office Visit  Subjective:  Patient ID: [name] Little, male    DOB: February 05, 1941  Age: 80 y.o. MRN: [id number]  CC:  Chief Complaint  Patient presents with  . Follow-up    Lab result discussion.    HPI [name] Little presents for discussion of his recent blood work that showed a mild increase in his hemoglobin A1c from 6.6-7.  He is aware of his diabetes tells me that he has had it for years and it is always been diet controlled.  He is aware that his age, diet and level of activity can affect his diabetes control.  Activity has been limited by his arthritis.  Tylenol has not been helping.  He does take clopidogrel  Past Medical History:  Diagnosis Date  . Allergic rhinitis   . Anxiety and depression   . Arthritis   . CAD (coronary artery disease)   . Diabetes mellitus   . GERD (gastroesophageal reflux disease)   . Headache(784.0) 06/2003   s/p neuro eval ? migraine  . Hyperlipidemia   . Hyperplastic colon polyp 2008  . Hypertension   . Iron deficiency    h/o  . Ischemic cardiomyopathy    EF had 30-35%. The most recent EF was 50% on echo in september 2009  . PUD (peptic ulcer disease) 7/11   EGD showed few small ulcers, esophagues strecthed  . Vision abnormalities     Past Surgical History:  Procedure Laterality Date  . ABDOMINAL AORTAGRAM  02/03/2020  . ABDOMINAL AORTOGRAM W/LOWER EXTREMITY N/A 02/03/2020   Procedure: ABDOMINAL AORTOGRAM W/LOWER EXTREMITY;  Surgeon: Lorretta Harp, MD;  Location: [facility];  Service: Cardiovascular;  Laterality: N/A;  . CATARACT EXTRACTION Right 07-2012  . CORONARY ARTERY BYPASS GRAFT  2004   LIMA to the LADs, sequentail SVG to intermediated and obtuse marginal, sequential SVG to PDA and posterolaterla  . INGUINAL HERNIA REPAIR Bilateral   . PERIPHERAL VASCULAR INTERVENTION Right 02/03/2020   Procedure: PERIPHERAL VASCULAR INTERVENTION;  Surgeon: Lorretta Harp, MD;  Location: [facility];  Service: Cardiovascular;  Laterality: Right;  . TONSILLECTOMY      Family History  Problem Relation Age of Onset  . Diabetes Father   . Heart attack Father   . Heart attack Mother   . Cancer Paternal Grandfather        type unknown  . Colon cancer Neg Hx   . Prostate cancer Neg Hx     Social History   Socioeconomic History  . Marital status: Married    Spouse name: Not on file  . Number of children: 0  . Years of education: Not on file  . Highest education level: Not on file  Occupational History  . Occupation: retired Freight forwarder   Tobacco Use  . Smoking status: Former Smoker    Types: Cigarettes    Quit date: 12/16/1976    Years since quitting: 43.6  . Smokeless tobacco: Never Used  Vaping Use  . Vaping Use: Never used  Substance and Sexual Activity  . Alcohol use: No    Alcohol/week: 0.0 standard drinks  . Drug use: No  . Sexual activity: Not on file  Other Topics Concern  . Not on file  Social History Narrative   Married , lives w/ wife , no children         Social Determinants of Health   Financial Resource Strain: Not on file  Food Insecurity: Not on  file  Transportation Needs: Not on file  Physical Activity: Not on file  Stress: Not on file  Social Connections: Not on file  Intimate Partner Violence: Not on file    Outpatient Medications Prior to Visit  Medication Sig Dispense Refill  . acetaminophen (TYLENOL) 500 MG tablet Take 1,000 mg by mouth every 6 (six) hours as needed for mild pain, moderate pain or headache (Allergies).     Marland Kitchen amLODipine (NORVASC) 2.5 MG tablet Take 1 tablet (2.5 mg total) by mouth daily. 90 tablet 0  . aspirin EC 81 MG EC tablet Take 1 tablet (81 mg total) by mouth daily. Swallow whole. 90 tablet 3  . atorvastatin (LIPITOR) 40 MG tablet Take 1 tablet (40 mg total) by mouth at bedtime. 90 tablet 0  . azelastine (ASTELIN) 0.1 % nasal spray Place 2 sprays into both nostrils at bedtime as needed for rhinitis or allergies. 30  mL 12  . clopidogrel (PLAVIX) 75 MG tablet Take 1 tablet (75 mg total) by mouth daily with breakfast. 90 tablet 3  . Coenzyme Q10 (COQ10) 400 MG CAPS Take 400 mg by mouth daily.     . enalapril (VASOTEC) 10 MG tablet TAKE 1 TABLET BY MOUTH 2 TIMES DAILY 180 tablet 2  . fish oil-omega-3 fatty acids 1000 MG capsule Take 1 g by mouth daily.    . Flaxseed, Linseed, (FLAX SEED OIL PO) Take 1,300 mg by mouth daily at 12 noon. Omega 3    . fluticasone (FLONASE) 50 MCG/ACT nasal spray Place 1 spray into both nostrils daily.   6  . furosemide (LASIX) 20 MG tablet Take 1 tablet (20 mg total) by mouth daily. 30 tablet 0  . metoprolol tartrate (LOPRESSOR) 50 MG tablet Take 1 tablet (50 mg total) by mouth 2 (two) times daily. 180 tablet 3  . pantoprazole (PROTONIX) 40 MG tablet Take 1 tablet (40 mg total) by mouth daily. 90 tablet 3  . Vitamin D3 (VITAMIN D) 25 MCG tablet Take 1,000 Units by mouth daily.     Facility-Administered Medications Prior to Visit  Medication Dose Route Frequency Provider Last Rate Last Admin  . sodium chloride flush (NS) 0.9 % injection 3 mL  3 mL Intravenous Q12H Lorretta Harp, MD        Allergies  Allergen Reactions  . Micardis [Telmisartan]     REACTION: diarrhea    ROS Review of Systems  Constitutional: Negative.  Negative for fatigue and unexpected weight change.  HENT: Negative.   Respiratory: Negative.   Cardiovascular: Negative.   Gastrointestinal: Negative.   Endocrine: Negative for polyphagia and polyuria.  Genitourinary: Negative for decreased urine volume.  Musculoskeletal: Positive for arthralgias.      Objective:    Physical Exam Nursing note reviewed.  Pulmonary:     Effort: Pulmonary effort is normal.  Neurological:     Mental Status: He is alert and oriented to person, place, and time.  Psychiatric:        Mood and Affect: Mood normal.        Behavior: Behavior normal.     Ht 5\' 10"  (1.778 m)   Wt 195 lb (88.5 kg)   BMI 27.98  kg/m  Wt Readings from Last 3 Encounters:  07/17/20 195 lb (88.5 kg)  07/12/20 202 lb (91.6 kg)  06/29/20 199 lb 9.6 oz (90.5 kg)     There are no preventive care reminders to display for this patient.  There are no preventive care reminders to  display for this patient.  Lab Results  Component Value Date   TSH 0.50 10/21/2019   Lab Results  Component Value Date   WBC 6.6 06/29/2020   HGB 15.4 06/29/2020   HCT 47.5 06/29/2020   MCV 93.3 06/29/2020   PLT 135.0 (L) 06/29/2020   Lab Results  Component Value Date   NA 140 06/29/2020   K 4.7 06/29/2020   CO2 30 06/29/2020   GLUCOSE 109 (H) 06/29/2020   BUN 28 (H) 06/29/2020   CREATININE 1.53 (H) 06/29/2020   BILITOT 1.2 06/01/2019   ALKPHOS 94 06/01/2019   AST 18 06/01/2019   ALT 21 06/01/2019   PROT 6.7 06/01/2019   ALBUMIN 4.3 06/01/2019   CALCIUM 9.5 06/29/2020   ANIONGAP 9 02/04/2020   GFR 42.64 (L) 06/29/2020   Lab Results  Component Value Date   CHOL 142 06/01/2019   Lab Results  Component Value Date   HDL 26.30 (L) 06/01/2019   Lab Results  Component Value Date   LDLCALC 77 06/01/2019   Lab Results  Component Value Date   TRIG 190.0 (H) 06/01/2019   Lab Results  Component Value Date   CHOLHDL 5 06/01/2019   Lab Results  Component Value Date   HGBA1C 7.0 (H) 06/29/2020      Assessment & Plan:   Problem List Items Addressed This Visit      Endocrine   Type 2 diabetes mellitus with diabetic neuropathy, without long-term current use of insulin ([place]) - Primary     Musculoskeletal and Integument   Arthritis     Genitourinary   Stage 3b chronic kidney disease ([place])      No orders of the defined types were placed in this encounter.   Follow-up: No follow-ups on file.  Advised patient to continue to hydrate well to promote the stability of his CKD.  He will try to lose some weight and moderate carbohydrate intake especially sugar.  Scheduled to see me in 3 months end of July.  Have a  discussion about his arthritic pains recheck his hemoglobin A1c   Virtual Visit via Video Note  I connected with [name] Little on 07/17/20 at 11:30 AM EDT by a video enabled telemedicine application and verified that I am speaking with the correct person using two identifiers.  Location: Patient: home alone.  Provider: work   I discussed the limitations of evaluation and management by telemedicine and the availability of in person appointments. The patient expressed understanding and agreed to proceed.  History of Present Illness:    Observations/Objective:   Assessment and Plan:   Follow Up Instructions:    I discussed the assessment and treatment plan with the patient. The patient was provided an opportunity to ask questions and all were answered. The patient agreed with the plan and demonstrated an understanding of the instructions.   The patient was advised to call back or seek an in-person evaluation if the symptoms worsen or if the condition fails to improve as anticipated.  I provided 25 minutes of non-face-to-face time during this encounter. Interactive video and audio telecommunications were attempted between myself and the patient. However they failed due to the patient having technical difficulties or not having access to video capability. We continued and completed with audio only.  Libby Maw, MD   Libby Maw, MD

## 2020-07-23 ENCOUNTER — Other Ambulatory Visit: Payer: Self-pay | Admitting: Internal Medicine

## 2020-07-24 ENCOUNTER — Other Ambulatory Visit: Payer: Self-pay | Admitting: Internal Medicine

## 2020-08-01 ENCOUNTER — Other Ambulatory Visit: Payer: Self-pay

## 2020-08-01 ENCOUNTER — Ambulatory Visit (HOSPITAL_COMMUNITY)
Admission: RE | Admit: 2020-08-01 | Discharge: 2020-08-01 | Disposition: A | Discharge status: Home / Self Care | Payer: Medicare HMO | Source: Ambulatory Visit | Attending: Cardiology | Admitting: Cardiology

## 2020-08-01 ENCOUNTER — Ambulatory Visit (HOSPITAL_BASED_OUTPATIENT_CLINIC_OR_DEPARTMENT_OTHER)
Admission: RE | Admit: 2020-08-01 | Discharge: 2020-08-01 | Disposition: A | Discharge status: Home / Self Care | Payer: Medicare HMO | Source: Ambulatory Visit | Attending: Cardiovascular Disease | Admitting: Cardiovascular Disease

## 2020-08-01 DIAGNOSIS — Z95828 Presence of other vascular implants and grafts: Secondary | ICD-10-CM

## 2020-08-17 ENCOUNTER — Encounter: Payer: Self-pay | Admitting: Family Medicine

## 2020-08-24 DIAGNOSIS — H52223 Regular astigmatism, bilateral: Secondary | ICD-10-CM | POA: Diagnosis not present

## 2020-08-24 DIAGNOSIS — I10 Essential (primary) hypertension: Secondary | ICD-10-CM | POA: Diagnosis not present

## 2020-08-24 DIAGNOSIS — E139 Other specified diabetes mellitus without complications: Secondary | ICD-10-CM | POA: Diagnosis not present

## 2020-08-24 LAB — HM DIABETES EYE EXAM

## 2020-08-28 ENCOUNTER — Telehealth: Payer: Self-pay | Admitting: Family Medicine

## 2020-08-28 NOTE — Telephone Encounter (Signed)
Left message for patient to call back and schedule Medicare Annual Wellness Visit (AWV).   Please offer to do virtually or by telephone.   Last AWV:11/02/2014  Please schedule at anytime with Nurse Health Advisor.

## 2020-09-15 ENCOUNTER — Other Ambulatory Visit: Payer: Self-pay | Admitting: Internal Medicine

## 2020-09-17 ENCOUNTER — Encounter: Payer: Self-pay | Admitting: Family Medicine

## 2020-09-18 MED ORDER — ATORVASTATIN CALCIUM 40 MG PO TABS: ORAL_TABLET | ORAL | 0 refills | Status: DC

## 2020-09-27 ENCOUNTER — Other Ambulatory Visit: Payer: Self-pay

## 2020-09-28 ENCOUNTER — Other Ambulatory Visit: Payer: Self-pay | Admitting: Family Medicine

## 2020-09-28 ENCOUNTER — Ambulatory Visit (INDEPENDENT_AMBULATORY_CARE_PROVIDER_SITE_OTHER): Payer: Medicare HMO | Admitting: Family Medicine

## 2020-09-28 ENCOUNTER — Encounter: Payer: Self-pay | Admitting: Family Medicine

## 2020-09-28 VITALS — BP 132/70 | HR 67 | Temp 97.0°F | Ht 70.0 in | Wt 197.0 lb

## 2020-09-28 DIAGNOSIS — E1140 Type 2 diabetes mellitus with diabetic neuropathy, unspecified: Secondary | ICD-10-CM | POA: Diagnosis not present

## 2020-09-28 DIAGNOSIS — Z0000 Encounter for general adult medical examination without abnormal findings: Secondary | ICD-10-CM | POA: Diagnosis not present

## 2020-09-28 DIAGNOSIS — M1990 Unspecified osteoarthritis, unspecified site: Secondary | ICD-10-CM | POA: Diagnosis not present

## 2020-09-28 DIAGNOSIS — N1832 Chronic kidney disease, stage 3b: Secondary | ICD-10-CM | POA: Diagnosis not present

## 2020-09-28 DIAGNOSIS — E875 Hyperkalemia: Secondary | ICD-10-CM

## 2020-09-28 DIAGNOSIS — H9110 Presbycusis, unspecified ear: Secondary | ICD-10-CM | POA: Diagnosis not present

## 2020-09-28 LAB — BASIC METABOLIC PANEL
BUN: 35 mg/dL — ABNORMAL HIGH (ref 6–23)
CO2: 28 mEq/L (ref 19–32)
Calcium: 9.7 mg/dL (ref 8.4–10.5)
Chloride: 99 mEq/L (ref 96–112)
Creatinine, Ser: 1.87 mg/dL — ABNORMAL HIGH (ref 0.40–1.50)
GFR: 33.45 mL/min — ABNORMAL LOW (ref >60.00)
Glucose, Bld: 152 mg/dL — ABNORMAL HIGH (ref 70–99)
Potassium: 5.5 mEq/L — ABNORMAL HIGH (ref 3.5–5.1)
Sodium: 137 mEq/L (ref 135–145)

## 2020-09-28 LAB — HEMOGLOBIN A1C: Hgb A1c MFr Bld: 7 % — ABNORMAL HIGH (ref 4.6–6.5)

## 2020-09-28 NOTE — Addendum Note (Signed)
Addended by: Jon Billings on: 09/28/2020 04:12 PM   Modules accepted: Orders

## 2020-09-28 NOTE — Progress Notes (Addendum)
Established Patient Office Visit  Subjective:  Patient ID: [name] Little, male    DOB: September 21, 1940  Age: 80 y.o. MRN: [id number]  CC:  Chief Complaint  Patient presents with   Follow-up    3 month follow up, no concerns.     HPI [name] Little presents for follow-up of diabetes, CKD and arthritis.  History of arthritis involving multiple joints.  At this point, he feels as though Tylenol is adequate.  He is exercising to the extent that he can.  He has worked on hydrating more.  He has been able to lose some weight by improving his diet and not snacking between meals.  His wife complains about his hearing.  He assures me that he feels good even though he cannot do his much as he used to do as a younger person.  Past Medical History:  Diagnosis Date   Allergic rhinitis    Anxiety and depression    Arthritis    CAD (coronary artery disease)    Diabetes mellitus    GERD (gastroesophageal reflux disease)    Headache(784.0) 06/2003   s/p neuro eval ? migraine   Hyperlipidemia    Hyperplastic colon polyp 2008   Hypertension    Iron deficiency    h/o   Ischemic cardiomyopathy    EF had 30-35%. The most recent EF was 50% on echo in september 2009   PUD (peptic ulcer disease) 7/11   EGD showed few small ulcers, esophagues strecthed   Vision abnormalities     Past Surgical History:  Procedure Laterality Date   ABDOMINAL AORTAGRAM  02/03/2020   ABDOMINAL AORTOGRAM W/LOWER EXTREMITY N/A 02/03/2020   Procedure: ABDOMINAL AORTOGRAM W/LOWER EXTREMITY;  Surgeon: Lorretta Harp, MD;  Location: [facility];  Service: Cardiovascular;  Laterality: N/A;   CATARACT EXTRACTION Right 07-2012   CORONARY ARTERY BYPASS GRAFT  2004   LIMA to the LADs, sequentail SVG to intermediated and obtuse marginal, sequential SVG to PDA and posterolaterla   INGUINAL HERNIA REPAIR Bilateral    PERIPHERAL VASCULAR INTERVENTION Right 02/03/2020   Procedure: PERIPHERAL VASCULAR INTERVENTION;   Surgeon: Lorretta Harp, MD;  Location: [facility];  Service: Cardiovascular;  Laterality: Right;   TONSILLECTOMY      Family History  Problem Relation Age of Onset   Diabetes Father    Heart attack Father    Heart attack Mother    Cancer Paternal Grandfather        type unknown   Colon cancer Neg Hx    Prostate cancer Neg Hx     Social History   Socioeconomic History   Marital status: Married    Spouse name: Not on file   Number of children: 0   Years of education: Not on file   Highest education level: Not on file  Occupational History   Occupation: retired Freight forwarder   Tobacco Use   Smoking status: Former    Types: Cigarettes    Quit date: 12/16/1976    Years since quitting: 43.8   Smokeless tobacco: Never  Vaping Use   Vaping Use: Never used  Substance and Sexual Activity   Alcohol use: No    Alcohol/week: 0.0 standard drinks   Drug use: No   Sexual activity: Not on file  Other Topics Concern   Not on file  Social History Narrative   Married , lives w/ wife , no children         Social  Determinants of Health   Financial Resource Strain: Not on file  Food Insecurity: Not on file  Transportation Needs: Not on file  Physical Activity: Not on file  Stress: Not on file  Social Connections: Not on file  Intimate Partner Violence: Not on file    Outpatient Medications Prior to Visit  Medication Sig Dispense Refill   acetaminophen (TYLENOL) 500 MG tablet Take 1,000 mg by mouth every 6 (six) hours as needed for mild pain, moderate pain or headache (Allergies).      amLODipine (NORVASC) 2.5 MG tablet Take 1 tablet (2.5 mg total) by mouth daily. 90 tablet 0   aspirin EC 81 MG EC tablet Take 1 tablet (81 mg total) by mouth daily. Swallow whole. 90 tablet 3   atorvastatin (LIPITOR) 40 MG tablet TAKE 1 TABLET BY MOUTH EVERYDAY AT BEDTIME 90 tablet 0   azelastine (ASTELIN) 0.1 % nasal spray Place 2 sprays into both nostrils at bedtime as needed for rhinitis  or allergies. 30 mL 12   clopidogrel (PLAVIX) 75 MG tablet Take 1 tablet (75 mg total) by mouth daily with breakfast. 90 tablet 3   Coenzyme Q10 (COQ10) 400 MG CAPS Take 400 mg by mouth daily.      enalapril (VASOTEC) 10 MG tablet TAKE 1 TABLET BY MOUTH 2 TIMES DAILY 180 tablet 2   fish oil-omega-3 fatty acids 1000 MG capsule Take 1 g by mouth daily.     Flaxseed, Linseed, (FLAX SEED OIL PO) Take 1,300 mg by mouth daily at 12 noon. Omega 3     fluticasone (FLONASE) 50 MCG/ACT nasal spray Place 1 spray into both nostrils daily.   6   furosemide (LASIX) 20 MG tablet TAKE ONE TABLET BY MOUTH ONE TIME DAILY 30 tablet 0   metoprolol tartrate (LOPRESSOR) 50 MG tablet Take 1 tablet (50 mg total) by mouth 2 (two) times daily. 180 tablet 3   pantoprazole (PROTONIX) 40 MG tablet Take 1 tablet (40 mg total) by mouth daily. 90 tablet 3   Vitamin D3 (VITAMIN D) 25 MCG tablet Take 1,000 Units by mouth daily.     Facility-Administered Medications Prior to Visit  Medication Dose Route Frequency Provider Last Rate Last Admin   sodium chloride flush (NS) 0.9 % injection 3 mL  3 mL Intravenous Q12H Lorretta Harp, MD        Allergies  Allergen Reactions   Micardis [Telmisartan]     REACTION: diarrhea    ROS Review of Systems  Constitutional:  Positive for fatigue.  HENT:  Positive for hearing loss. Negative for ear pain.   Respiratory: Negative.    Cardiovascular: Negative.   Gastrointestinal: Negative.   Endocrine: Negative for polyphagia and polyuria.  Genitourinary:  Negative for frequency.  Musculoskeletal:  Positive for arthralgias and back pain.  Neurological:  Negative for speech difficulty and weakness.  Psychiatric/Behavioral: Negative.       Objective:    Physical Exam Vitals and nursing note reviewed.  Constitutional:      General: He is not in acute distress.    Appearance: Normal appearance. He is not ill-appearing, toxic-appearing or diaphoretic.  HENT:     Head:  Normocephalic and atraumatic.     Right Ear: Tympanic membrane, ear canal and external ear normal.     Left Ear: Tympanic membrane, ear canal and external ear normal.     Ears:     Comments: Small non obstructing cerumen on the right.    Mouth/Throat:  Mouth: Mucous membranes are moist.     Pharynx: Oropharynx is clear. No oropharyngeal exudate or posterior oropharyngeal erythema.  Eyes:     General: No scleral icterus.       Right eye: No discharge.        Left eye: No discharge.     Extraocular Movements: Extraocular movements intact.     Conjunctiva/sclera: Conjunctivae normal.     Pupils: Pupils are equal, round, and reactive to light.  Cardiovascular:     Rate and Rhythm: Normal rate and regular rhythm.  Pulmonary:     Effort: Pulmonary effort is normal.     Breath sounds: Normal breath sounds.  Abdominal:     General: Bowel sounds are normal.  Musculoskeletal:     Cervical back: No rigidity or tenderness.  Lymphadenopathy:     Cervical: No cervical adenopathy.  Skin:    General: Skin is warm and dry.  Neurological:     Mental Status: He is alert and oriented to person, place, and time.  Psychiatric:        Mood and Affect: Mood normal.        Behavior: Behavior normal.    BP 132/70 (BP Location: Right Arm, Patient Position: Sitting, Cuff Size: Normal)   Pulse 67   Temp (!) 97 F (36.1 C) (Temporal)   Ht '5\' 10"'$  (1.778 m)   Wt 197 lb (89.4 kg)   SpO2 97%   BMI 28.27 kg/m  Wt Readings from Last 3 Encounters:  09/28/20 197 lb (89.4 kg)  07/17/20 195 lb (88.5 kg)  07/12/20 202 lb (91.6 kg)     Health Maintenance Due  Topic Date Due   Zoster Vaccines- Shingrix (1 of 2) Never done    There are no preventive care reminders to display for this patient.  Lab Results  Component Value Date   TSH 0.50 10/21/2019   Lab Results  Component Value Date   WBC 6.6 06/29/2020   HGB 15.4 06/29/2020   HCT 47.5 06/29/2020   MCV 93.3 06/29/2020   PLT 135.0 (L)  06/29/2020   Lab Results  Component Value Date   NA 137 09/28/2020   K 5.5 (H) 09/28/2020   CO2 28 09/28/2020   GLUCOSE 152 (H) 09/28/2020   BUN 35 (H) 09/28/2020   CREATININE 1.87 (H) 09/28/2020   BILITOT 1.2 06/01/2019   ALKPHOS 94 06/01/2019   AST 18 06/01/2019   ALT 21 06/01/2019   PROT 6.7 06/01/2019   ALBUMIN 4.3 06/01/2019   CALCIUM 9.7 09/28/2020   ANIONGAP 9 02/04/2020   GFR 33.45 (L) 09/28/2020   Lab Results  Component Value Date   CHOL 142 06/01/2019   Lab Results  Component Value Date   HDL 26.30 (L) 06/01/2019   Lab Results  Component Value Date   LDLCALC 77 06/01/2019   Lab Results  Component Value Date   TRIG 190.0 (H) 06/01/2019   Lab Results  Component Value Date   CHOLHDL 5 06/01/2019   Lab Results  Component Value Date   HGBA1C 7.0 (H) 09/28/2020      Assessment & Plan:   Problem List Items Addressed This Visit       Endocrine   Type 2 diabetes mellitus with diabetic neuropathy, without long-term current use of insulin ([place]) - Primary   Relevant Orders   Basic metabolic panel (Completed)   Hemoglobin A1c (Completed)     Nervous and Auditory   Presbycusis     Musculoskeletal and Integument  Arthritis     Genitourinary   Stage 3b chronic kidney disease ([place])   Relevant Orders   Basic metabolic panel (Completed)     Other   Healthcare maintenance   Other Visit Diagnoses     Hyperkalemia       Relevant Orders   Potassium       No orders of the defined types were placed in this encounter.   Follow-up: Return in about 3 months (around 12/29/2020), or Advise Shingrix. Continue weight loss and hydrating well. Consider hearing amplification..   Rechecking hemoglobin A1c today.  Again I discussed that diabetes is progressive.  He does not seem to be receptive to starting a medicine for diabetes.  Encouraged him to continue exercising and losing weight.  Encouraged him to see an audiologist for consideration of hearing  amplification.  Continue hydrating.  Advised the Shingrix vaccine he was given information on presbycusis and the Shingrix vaccine. Will need to add ACE at next visit.  Libby Maw, MD   7/28 addendum: HgA1c remains at 7. Will try Pioglitizone '15mg'$  daily.  Ecchocardiagram in 2016 with EF of 45%. GFR has declined. Hyperkalemia. Have asked for repeat potassium.           And hyperkalemia. Will order redraw.

## 2020-09-29 ENCOUNTER — Encounter: Payer: Self-pay | Admitting: Family Medicine

## 2020-09-29 DIAGNOSIS — E1140 Type 2 diabetes mellitus with diabetic neuropathy, unspecified: Secondary | ICD-10-CM

## 2020-10-02 ENCOUNTER — Other Ambulatory Visit: Payer: Self-pay

## 2020-10-02 ENCOUNTER — Other Ambulatory Visit (INDEPENDENT_AMBULATORY_CARE_PROVIDER_SITE_OTHER): Payer: Medicare HMO

## 2020-10-02 DIAGNOSIS — E875 Hyperkalemia: Secondary | ICD-10-CM

## 2020-10-02 DIAGNOSIS — I2510 Atherosclerotic heart disease of native coronary artery without angina pectoris: Secondary | ICD-10-CM

## 2020-10-02 LAB — POTASSIUM: Potassium: 4.7 mEq/L (ref 3.5–5.1)

## 2020-10-02 MED ORDER — PIOGLITAZONE HCL 15 MG PO TABS: 15.0000 mg | ORAL_TABLET | Freq: Every day | ORAL | 2 refills | Status: DC

## 2020-10-03 NOTE — Progress Notes (Signed)
Repeat potassium was okay.

## 2020-10-05 NOTE — Addendum Note (Signed)
Addended by: Jon Billings on: 10/05/2020 08:03 AM   Modules accepted: Orders

## 2020-10-09 ENCOUNTER — Other Ambulatory Visit (INDEPENDENT_AMBULATORY_CARE_PROVIDER_SITE_OTHER): Payer: Medicare HMO

## 2020-10-09 ENCOUNTER — Other Ambulatory Visit: Payer: Self-pay

## 2020-10-09 DIAGNOSIS — I2510 Atherosclerotic heart disease of native coronary artery without angina pectoris: Secondary | ICD-10-CM

## 2020-10-09 DIAGNOSIS — I2583 Coronary atherosclerosis due to lipid rich plaque: Secondary | ICD-10-CM | POA: Diagnosis not present

## 2020-10-09 LAB — LIPID PANEL
Cholesterol: 109 mg/dL (ref 0–200)
HDL: 27.9 mg/dL — ABNORMAL LOW (ref >39.00)
LDL Cholesterol: 54 mg/dL (ref 0–99)
NonHDL: 80.66
Total CHOL/HDL Ratio: 4
Triglycerides: 131 mg/dL (ref 0.0–149.0)
VLDL: 26.2 mg/dL (ref 0.0–40.0)

## 2020-10-09 NOTE — Progress Notes (Signed)
Per the orders of Dr. Ethelene Hal pt is here for labs pt has tolerated draw well.

## 2020-10-21 ENCOUNTER — Other Ambulatory Visit: Payer: Self-pay | Admitting: Family Medicine

## 2020-11-22 ENCOUNTER — Telehealth: Payer: Self-pay | Admitting: Family Medicine

## 2020-11-22 NOTE — Telephone Encounter (Signed)
Left message for patient to call back and schedule Medicare Annual Wellness Visit (AWV). Please offer to do virtually or by telephone.  Left office number and my jabber #[phone]. ? ?Due for AWVI ? ?Please schedule at anytime with Nurse Health Advisor. ?  ?

## 2020-11-30 DIAGNOSIS — H52223 Regular astigmatism, bilateral: Secondary | ICD-10-CM | POA: Diagnosis not present

## 2020-11-30 DIAGNOSIS — H5203 Hypermetropia, bilateral: Secondary | ICD-10-CM | POA: Diagnosis not present

## 2020-11-30 DIAGNOSIS — H524 Presbyopia: Secondary | ICD-10-CM | POA: Diagnosis not present

## 2020-11-30 DIAGNOSIS — H2513 Age-related nuclear cataract, bilateral: Secondary | ICD-10-CM | POA: Diagnosis not present

## 2020-12-03 DIAGNOSIS — I420 Dilated cardiomyopathy: Secondary | ICD-10-CM | POA: Insufficient documentation

## 2020-12-03 NOTE — Progress Notes (Signed)
Cardiology Office Note   Date:  12/04/2020   ID:  HAIDER, HORNADAY 1941-01-15, MRN [id number]  PCP:  Libby Maw, MD  Cardiologist:   [name], MD   Chief Complaint  Patient presents with   Coronary Artery Disease       History of Present Illness: [name] Little is a 80 y.o. male who presents for one year follow up of CAD.  Since I last saw him he has done well.  Since I last saw him he saw Dr. Gwenlyn Found for claudication.  Right ABI was  0.78 with a high-frequency signal in his right common iliac artery.  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 He had orbital atherectomy followed by VBX covered stenting with excellent result.   This was late 2021.   He has been working hard cleaning out rooms in his house. The patient denies any new symptoms such as chest discomfort, neck or arm discomfort. There has been no new shortness of breath, PND or orthopnea. There have been no reported palpitations, presyncope or syncope.    Past Medical History:  Diagnosis Date   Allergic rhinitis    Anxiety and depression    Arthritis    CAD (coronary artery disease)    Diabetes mellitus    GERD (gastroesophageal reflux disease)    Headache(784.0) 06/2003   s/p neuro eval ? migraine   Hyperlipidemia    Hyperplastic colon polyp 2008   Hypertension    Iron deficiency    h/o   Ischemic cardiomyopathy    EF had 30-35%. The most recent EF was 50% on echo in september 2009   PUD (peptic ulcer disease) 7/11   EGD showed few small ulcers, esophagues strecthed   Vision abnormalities     Past Surgical History:  Procedure Laterality Date   ABDOMINAL AORTAGRAM  02/03/2020   ABDOMINAL AORTOGRAM W/LOWER EXTREMITY N/A 02/03/2020   Procedure: ABDOMINAL AORTOGRAM W/LOWER EXTREMITY;  Surgeon: Lorretta Harp, MD;  Location: [facility];  Service: Cardiovascular;  Laterality: N/A;    CATARACT EXTRACTION Right 07-2012   CORONARY ARTERY BYPASS GRAFT  2004   LIMA to the LADs, sequentail SVG to intermediated and obtuse marginal, sequential SVG to PDA and posterolaterla   INGUINAL HERNIA REPAIR Bilateral    PERIPHERAL VASCULAR INTERVENTION Right 02/03/2020   Procedure: PERIPHERAL VASCULAR INTERVENTION;  Surgeon: Lorretta Harp, MD;  Location: [facility];  Service: Cardiovascular;  Laterality: Right;   TONSILLECTOMY       Current Outpatient Medications  Medication Sig Dispense Refill   acetaminophen (TYLENOL) 500 MG tablet Take 1,000 mg by mouth every 6 (six) hours as needed for mild pain, moderate pain or headache (Allergies).      amLODipine (NORVASC) 2.5 MG tablet Take 1 tablet (2.5 mg total) by mouth daily. 90 tablet 0   aspirin EC 81 MG EC tablet Take 1 tablet (81 mg total) by mouth daily. Swallow whole. 90 tablet 3   atorvastatin (LIPITOR) 40 MG tablet TAKE 1 TABLET BY MOUTH EVERYDAY AT BEDTIME 90 tablet 0   azelastine (ASTELIN) 0.1 % nasal spray Place 2 sprays into both nostrils at bedtime as needed for rhinitis or allergies. 30 mL 12   clopidogrel (PLAVIX) 75 MG tablet Take 1 tablet (75 mg total) by mouth daily with breakfast. 90 tablet 3   Coenzyme Q10 (COQ10) 400 MG CAPS  Take 400 mg by mouth daily.      enalapril (VASOTEC) 10 MG tablet TAKE 1 TABLET BY MOUTH 2 TIMES DAILY 180 tablet 2   fish oil-omega-3 fatty acids 1000 MG capsule Take 1 g by mouth daily.     Flaxseed, Linseed, (FLAX SEED OIL PO) Take 1,300 mg by mouth daily at 12 noon. Omega 3     fluticasone (FLONASE) 50 MCG/ACT nasal spray Place 1 spray into both nostrils daily.   6   furosemide (LASIX) 20 MG tablet TAKE ONE TABLET BY MOUTH ONE TIME DAILY 90 tablet 1   metoprolol tartrate (LOPRESSOR) 50 MG tablet Take 1 tablet (50 mg total) by mouth 2 (two) times daily. 180 tablet 3   pantoprazole (PROTONIX) 40 MG tablet Take 1 tablet (40 mg total) by mouth daily. 90 tablet 3   pioglitazone (ACTOS) 15 MG  tablet Take 1 tablet (15 mg total) by mouth daily. 30 tablet 2   Vitamin D3 (VITAMIN D) 25 MCG tablet Take 1,000 Units by mouth daily.     Current Facility-Administered Medications  Medication Dose Route Frequency Provider Last Rate Last Admin   sodium chloride flush (NS) 0.9 % injection 3 mL  3 mL Intravenous Q12H Lorretta Harp, MD        Allergies:   Micardis [telmisartan]    ROS:  Please see the history of present illness.   Otherwise, review of systems are positive for none.   All other systems are reviewed and negative.    PHYSICAL EXAM: VS:  BP 124/74   Pulse 70   Ht 5\' 10"  (1.778 m)   Wt 192 lb 9.6 oz (87.4 kg)   SpO2 99%   BMI 27.64 kg/m  , BMI Body mass index is 27.64 kg/m. GENERAL:  Well appearing NECK:  No jugular venous distention, waveform within normal limits, carotid upstroke brisk and symmetric, no bruits, no thyromegaly LUNGS:  Clear to auscultation bilaterally CHEST:  Well healed sternotomy scar. HEART:  PMI not displaced or sustained,S1 and S2 within normal limits, no S3, no S4, no clicks, no rubs, no murmurs ABD:  Flat, positive bowel sounds normal in frequency in pitch, no bruits, no rebound, no guarding, no midline pulsatile mass, no hepatomegaly, no splenomegaly EXT:  2 plus pulses throughout, no edema, no cyanosis no clubbing  EKG:  EKG is not ordered today. The ekg ordered 07/12/2020 demonstrates sinus rhythm, rate 70, axis within normal limits, old and inferior infarct probably posterior as well, lateral Q waves, premature ventricular contractions.  No change from previous     Recent Labs: 06/29/2020: Hemoglobin 15.4; Platelets 135.0 09/28/2020: BUN 35; Creatinine, Ser 1.87; Sodium 137 10/02/2020: Potassium 4.7    Lipid Panel    Component Value Date/Time   CHOL 109 10/09/2020 0820   TRIG 131.0 10/09/2020 0820   HDL 27.90 (L) 10/09/2020 0820   CHOLHDL 4 10/09/2020 0820   VLDL 26.2 10/09/2020 0820   LDLCALC 54 10/09/2020 0820   LDLDIRECT 82.0  06/29/2020 1430      Wt Readings from Last 3 Encounters:  12/04/20 192 lb 9.6 oz (87.4 kg)  09/28/20 197 lb (89.4 kg)  07/17/20 195 lb (88.5 kg)      Other studies Reviewed: Additional studies/ records that were reviewed today include: Labs. Review of the above records demonstrates:  Please see elsewhere in the note.     ASSESSMENT AND PLAN:  CAD -  The patient has no new sypmtoms.  No further cardiovascular testing is  indicated.  We will continue with aggressive risk reduction and meds as listed.  CARDIOMYOPATHY - He had a mildy reduced EF in 2016.   He has no symptoms suggestive of reduced ejection fraction and seems to be euvolemic.  No change in therapy.  HYPERTENSION -  The blood pressure is well controlled.  No change in therapy.  HYPERLIPIDEMIA -  LDL was 54 with an HDL of 27.9.  This is better than previous.  No change in therapy.  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  No further imaging.   PVD  He is much improved.  He will continue with risk reduction.  DM - I did review primary care records and he had access started previously.  CKD IIIa - His creatinine was 1.87.  At the next visit he is going to have an ACE inhibitor added for renal protection which is not unreasonable.  We will have this followed by Libby Maw, MD  Current medicines are reviewed at length with the patient today.  The patient does not have concerns regarding medicines.  The following changes have been made: None  Labs/ tests ordered today include: None  No orders of the defined types were placed in this encounter.    Disposition:   FU with me in 12 months.     Signed, [name], MD  12/04/2020 1:28 PM    [facility]

## 2020-12-04 ENCOUNTER — Encounter: Payer: Self-pay | Admitting: Cardiology

## 2020-12-04 ENCOUNTER — Other Ambulatory Visit: Payer: Self-pay

## 2020-12-04 ENCOUNTER — Ambulatory Visit: Payer: Medicare HMO | Admitting: Cardiology

## 2020-12-04 VITALS — BP 124/74 | HR 70 | Ht 70.0 in | Wt 192.6 lb

## 2020-12-04 DIAGNOSIS — I10 Essential (primary) hypertension: Secondary | ICD-10-CM

## 2020-12-04 DIAGNOSIS — I420 Dilated cardiomyopathy: Secondary | ICD-10-CM

## 2020-12-04 DIAGNOSIS — I739 Peripheral vascular disease, unspecified: Secondary | ICD-10-CM | POA: Diagnosis not present

## 2020-12-04 DIAGNOSIS — I6529 Occlusion and stenosis of unspecified carotid artery: Secondary | ICD-10-CM

## 2020-12-04 DIAGNOSIS — E785 Hyperlipidemia, unspecified: Secondary | ICD-10-CM | POA: Diagnosis not present

## 2020-12-04 DIAGNOSIS — I2510 Atherosclerotic heart disease of native coronary artery without angina pectoris: Secondary | ICD-10-CM | POA: Diagnosis not present

## 2020-12-04 NOTE — Patient Instructions (Signed)
Medication Instructions:  Your Physician recommend you continue on your current medication as directed.    *If you need a refill on your cardiac medications before your next appointment, please call your pharmacy*   Lab Work: None ordered today   Testing/Procedures: None ordered today   Follow-Up: At [facility], you and your health needs are our priority.  As part of our continuing mission to provide you with exceptional heart care, we have created designated Provider Care Teams.  These Care Teams include your primary Cardiologist (physician) and Advanced Practice Providers (APPs -  Physician Assistants and Nurse Practitioners) who all work together to provide you with the care you need, when you need it.  We recommend signing up for the patient portal called "MyChart".  Sign up information is provided on this After Visit Summary.  MyChart is used to connect with patients for Virtual Visits (Telemedicine).  Patients are able to view lab/test results, encounter notes, upcoming appointments, etc.  Non-urgent messages can be sent to your provider as well.   To learn more about what you can do with MyChart, go to [email or web address].    Your next appointment:   1 year(s)  The format for your next appointment:   In Person  Provider:   James Hochrein, MD {        

## 2020-12-12 ENCOUNTER — Other Ambulatory Visit (HOSPITAL_COMMUNITY): Payer: Self-pay | Admitting: Cardiovascular Disease

## 2020-12-12 DIAGNOSIS — Z95828 Presence of other vascular implants and grafts: Secondary | ICD-10-CM

## 2020-12-13 ENCOUNTER — Other Ambulatory Visit: Payer: Self-pay | Admitting: Family Medicine

## 2020-12-21 ENCOUNTER — Other Ambulatory Visit: Payer: Self-pay | Admitting: Family Medicine

## 2020-12-21 DIAGNOSIS — E1140 Type 2 diabetes mellitus with diabetic neuropathy, unspecified: Secondary | ICD-10-CM

## 2020-12-29 ENCOUNTER — Ambulatory Visit: Payer: Medicare HMO | Admitting: Family Medicine

## 2020-12-31 ENCOUNTER — Other Ambulatory Visit: Payer: Self-pay | Admitting: Family Medicine

## 2020-12-31 DIAGNOSIS — E1140 Type 2 diabetes mellitus with diabetic neuropathy, unspecified: Secondary | ICD-10-CM

## 2021-01-02 ENCOUNTER — Telehealth: Payer: Self-pay | Admitting: Family Medicine

## 2021-01-02 NOTE — Telephone Encounter (Signed)
Patient aware that requested Rx was sent in on 01/01/21 patient agrees to call pharmacy back to mark sure they have it.

## 2021-01-03 ENCOUNTER — Other Ambulatory Visit: Payer: Self-pay

## 2021-01-03 ENCOUNTER — Ambulatory Visit (INDEPENDENT_AMBULATORY_CARE_PROVIDER_SITE_OTHER): Payer: Medicare HMO | Admitting: Family Medicine

## 2021-01-03 ENCOUNTER — Encounter: Payer: Self-pay | Admitting: Family Medicine

## 2021-01-03 VITALS — BP 140/80 | HR 61 | Temp 97.9°F | Ht 70.0 in | Wt 186.6 lb

## 2021-01-03 DIAGNOSIS — E1140 Type 2 diabetes mellitus with diabetic neuropathy, unspecified: Secondary | ICD-10-CM

## 2021-01-03 DIAGNOSIS — E785 Hyperlipidemia, unspecified: Secondary | ICD-10-CM

## 2021-01-03 DIAGNOSIS — I10 Essential (primary) hypertension: Secondary | ICD-10-CM | POA: Diagnosis not present

## 2021-01-03 DIAGNOSIS — R252 Cramp and spasm: Secondary | ICD-10-CM | POA: Diagnosis not present

## 2021-01-03 DIAGNOSIS — N1832 Chronic kidney disease, stage 3b: Secondary | ICD-10-CM

## 2021-01-03 LAB — URINALYSIS, ROUTINE W REFLEX MICROSCOPIC
Bilirubin Urine: NEGATIVE
Hgb urine dipstick: NEGATIVE
Ketones, ur: NEGATIVE
Leukocytes,Ua: NEGATIVE
Nitrite: NEGATIVE
RBC / HPF: NONE SEEN (ref 0–?)
Specific Gravity, Urine: <=1.005 — AB (ref 1.000–1.030)
Total Protein, Urine: NEGATIVE
Urine Glucose: NEGATIVE
Urobilinogen, UA: 0.2 (ref 0.0–1.0)
WBC, UA: NONE SEEN (ref 0–?)
pH: 6 (ref 5.0–8.0)

## 2021-01-03 LAB — CBC
HCT: 45.9 % (ref 39.0–52.0)
Hemoglobin: 15.4 g/dL (ref 13.0–17.0)
MCHC: 33.6 g/dL (ref 30.0–36.0)
MCV: 93.2 fl (ref 78.0–100.0)
Platelets: 129 10*3/uL — ABNORMAL LOW (ref 150.0–400.0)
RBC: 4.92 Mil/uL (ref 4.22–5.81)
RDW: 13.6 % (ref 11.5–15.5)
WBC: 5.8 10*3/uL (ref 4.0–10.5)

## 2021-01-03 LAB — MICROALBUMIN / CREATININE URINE RATIO
Creatinine,U: 29.3 mg/dL
Microalb Creat Ratio: 2.4 mg/g (ref 0.0–30.0)
Microalb, Ur: <0.7 mg/dL (ref 0.0–1.9)

## 2021-01-03 LAB — BASIC METABOLIC PANEL
BUN: 27 mg/dL — ABNORMAL HIGH (ref 6–23)
CO2: 29 mEq/L (ref 19–32)
Calcium: 9.6 mg/dL (ref 8.4–10.5)
Chloride: 99 mEq/L (ref 96–112)
Creatinine, Ser: 1.49 mg/dL (ref 0.40–1.50)
GFR: 43.86 mL/min — ABNORMAL LOW (ref >60.00)
Glucose, Bld: 107 mg/dL — ABNORMAL HIGH (ref 70–99)
Potassium: 4.6 mEq/L (ref 3.5–5.1)
Sodium: 137 mEq/L (ref 135–145)

## 2021-01-03 LAB — MAGNESIUM: Magnesium: 1.8 mg/dL (ref 1.5–2.5)

## 2021-01-03 LAB — LDL CHOLESTEROL, DIRECT: Direct LDL: 79 mg/dL

## 2021-01-03 LAB — HEMOGLOBIN A1C: Hgb A1c MFr Bld: 6.5 % (ref 4.6–6.5)

## 2021-01-03 NOTE — Progress Notes (Addendum)
Established Patient Office Visit  Subjective:  Patient ID: [name] Little, male    DOB: 09-Aug-1940  Age: 80 y.o. MRN: [id number]  CC:  Chief Complaint  Patient presents with   Follow-up    3 month follow up, concerns about left hand cramping up a lot not sure why. Fasting for labs.     HPI [name] presents for follow-up of hypertension, diabetes elevated cholesterol.  History of hypertension with diastolic heart failure.  Last EF was 45 to 50%.  He does have a history of coronary artery disease.  With this atorvastatin.  He has been hydrating well.  He is done well with the Actos.  He has lost weight.  Decreased his sweet intake.  He is exercising at least 30 minutes daily in his yard.  Upcoming evaluation of peripheral vascular disease per catheterization with Dr. Alvester Chou.  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  Denies shortness of breath or swelling in his lower extremities with activities.  Past Medical History:  Diagnosis Date   Allergic rhinitis    Anxiety and depression    Arthritis    CAD (coronary artery disease)    Diabetes mellitus    GERD (gastroesophageal reflux disease)    Headache(784.0) 06/2003   s/p neuro eval ? migraine   Hyperlipidemia    Hyperplastic colon polyp 2008   Hypertension    Iron deficiency    h/o   Ischemic cardiomyopathy    EF had 30-35%. The most recent EF was 50% on echo in september 2009   PUD (peptic ulcer disease) 7/11   EGD showed few small ulcers, esophagues strecthed   Vision abnormalities     Past Surgical History:  Procedure Laterality Date   ABDOMINAL AORTAGRAM  02/03/2020   ABDOMINAL AORTOGRAM W/LOWER EXTREMITY N/A 02/03/2020   Procedure: ABDOMINAL AORTOGRAM W/LOWER EXTREMITY;  Surgeon: Lorretta Harp, MD;  Location: [facility];  Service: Cardiovascular;  Laterality: N/A;   CATARACT EXTRACTION Right 07-2012   CORONARY ARTERY BYPASS GRAFT  2004   LIMA to the LADs, sequentail SVG to intermediated and  obtuse marginal, sequential SVG to PDA and posterolaterla   INGUINAL HERNIA REPAIR Bilateral    PERIPHERAL VASCULAR INTERVENTION Right 02/03/2020   Procedure: PERIPHERAL VASCULAR INTERVENTION;  Surgeon: Lorretta Harp, MD;  Location: [facility];  Service: Cardiovascular;  Laterality: Right;   TONSILLECTOMY      Family History  Problem Relation Age of Onset   Diabetes Father    Heart attack Father    Heart attack Mother    Cancer Paternal Grandfather        type unknown   Colon cancer Neg Hx    Prostate cancer Neg Hx     Social History   Socioeconomic History   Marital status: Married    Spouse name: Not on file   Number of children: 0   Years of education: Not on file   Highest education level: Not on file  Occupational History   Occupation: retired Freight forwarder   Tobacco Use   Smoking status: Former    Types: Cigarettes    Quit date: 12/16/1976    Years since quitting: 44.0   Smokeless tobacco: Never  Vaping Use   Vaping Use: Never used  Substance and Sexual Activity   Alcohol use: No    Alcohol/week: 0.0 standard drinks   Drug use: No   Sexual activity: Not on file  Other Topics Concern   Not on  file  Social History Narrative   Married , lives w/ wife , no children         Social Determinants of Health   Financial Resource Strain: Not on file  Food Insecurity: Not on file  Transportation Needs: Not on file  Physical Activity: Not on file  Stress: Not on file  Social Connections: Not on file  Intimate Partner Violence: Not on file    Outpatient Medications Prior to Visit  Medication Sig Dispense Refill   acetaminophen (TYLENOL) 500 MG tablet Take 1,000 mg by mouth every 6 (six) hours as needed for mild pain, moderate pain or headache (Allergies).      amLODipine (NORVASC) 2.5 MG tablet Take 1 tablet (2.5 mg total) by mouth daily. 90 tablet 0   aspirin EC 81 MG EC tablet Take 1 tablet (81 mg total) by mouth daily. Swallow whole. 90 tablet 3    atorvastatin (LIPITOR) 40 MG tablet TAKE ONE TABLET BY MOUTH AT BEDTIME 90 tablet 0   azelastine (ASTELIN) 0.1 % nasal spray Place 2 sprays into both nostrils at bedtime as needed for rhinitis or allergies. 30 mL 12   clopidogrel (PLAVIX) 75 MG tablet Take 1 tablet (75 mg total) by mouth daily with breakfast. 90 tablet 3   Coenzyme Q10 (COQ10) 400 MG CAPS Take 400 mg by mouth daily.      enalapril (VASOTEC) 10 MG tablet TAKE ONE TABLET BY MOUTH TWICE A DAY 360 tablet 0   fish oil-omega-3 fatty acids 1000 MG capsule Take 1 g by mouth daily.     Flaxseed, Linseed, (FLAX SEED OIL PO) Take 1,300 mg by mouth daily at 12 noon. Omega 3     fluticasone (FLONASE) 50 MCG/ACT nasal spray Place 1 spray into both nostrils daily.   6   furosemide (LASIX) 20 MG tablet TAKE ONE TABLET BY MOUTH ONE TIME DAILY 90 tablet 1   metoprolol tartrate (LOPRESSOR) 50 MG tablet Take 1 tablet (50 mg total) by mouth 2 (two) times daily. 180 tablet 3   pantoprazole (PROTONIX) 40 MG tablet Take 1 tablet (40 mg total) by mouth daily. 90 tablet 3   pioglitazone (ACTOS) 15 MG tablet TAKE ONE TABLET BY MOUTH ONE TIME DAILY 30 tablet 2   Vitamin D3 (VITAMIN D) 25 MCG tablet Take 1,000 Units by mouth daily.     Facility-Administered Medications Prior to Visit  Medication Dose Route Frequency Provider Last Rate Last Admin   sodium chloride flush (NS) 0.9 % injection 3 mL  3 mL Intravenous Q12H Lorretta Harp, MD        Allergies  Allergen Reactions   Micardis [Telmisartan]     REACTION: diarrhea    ROS Review of Systems  Constitutional: Negative.   HENT: Negative.    Eyes:  Negative for photophobia and visual disturbance.  Respiratory: Negative.  Negative for chest tightness and shortness of breath.   Cardiovascular: Negative.  Negative for chest pain and leg swelling.  Gastrointestinal: Negative.   Musculoskeletal:  Positive for arthralgias. Negative for gait problem.  Neurological:  Negative for speech difficulty  and weakness.  Psychiatric/Behavioral: Negative.       Objective:    Physical Exam Vitals and nursing note reviewed.  Constitutional:      General: He is not in acute distress.    Appearance: Normal appearance. He is not ill-appearing, toxic-appearing or diaphoretic.  HENT:     Head: Normocephalic and atraumatic.     Right Ear: External ear  normal.     Left Ear: External ear normal.     Mouth/Throat:     Mouth: Mucous membranes are moist.     Pharynx: Oropharynx is clear. No oropharyngeal exudate or posterior oropharyngeal erythema.  Eyes:     General: No scleral icterus.       Right eye: No discharge.        Left eye: No discharge.     Extraocular Movements: Extraocular movements intact.     Conjunctiva/sclera: Conjunctivae normal.     Pupils: Pupils are equal, round, and reactive to light.  Cardiovascular:     Rate and Rhythm: Normal rate and regular rhythm.  Pulmonary:     Effort: Pulmonary effort is normal.     Breath sounds: Normal breath sounds. No rales.  Abdominal:     General: Bowel sounds are normal.  Musculoskeletal:     Right lower leg: No edema.     Left lower leg: No edema.  Neurological:     Mental Status: He is alert and oriented to person, place, and time.  Psychiatric:        Mood and Affect: Mood normal.        Behavior: Behavior normal.    BP 140/80 (BP Location: Right Arm, Patient Position: Sitting, Cuff Size: Large)   Pulse 61   Temp 97.9 F (36.6 C) (Temporal)   Ht 5\' 10"  (1.778 m)   Wt 186 lb 9.6 oz (84.6 kg)   SpO2 99%   BMI 26.77 kg/m  Wt Readings from Last 3 Encounters:  01/03/21 186 lb 9.6 oz (84.6 kg)  12/04/20 192 lb 9.6 oz (87.4 kg)  09/28/20 197 lb (89.4 kg)     Health Maintenance Due  Topic Date Due   Zoster Vaccines- Shingrix (1 of 2) Never done    There are no preventive care reminders to display for this patient.  Lab Results  Component Value Date   TSH 0.50 10/21/2019   Lab Results  Component Value Date   WBC  6.6 06/29/2020   HGB 15.4 06/29/2020   HCT 47.5 06/29/2020   MCV 93.3 06/29/2020   PLT 135.0 (L) 06/29/2020   Lab Results  Component Value Date   NA 137 09/28/2020   K 4.7 10/02/2020   CO2 28 09/28/2020   GLUCOSE 152 (H) 09/28/2020   BUN 35 (H) 09/28/2020   CREATININE 1.87 (H) 09/28/2020   BILITOT 1.2 06/01/2019   ALKPHOS 94 06/01/2019   AST 18 06/01/2019   ALT 21 06/01/2019   PROT 6.7 06/01/2019   ALBUMIN 4.3 06/01/2019   CALCIUM 9.7 09/28/2020   ANIONGAP 9 02/04/2020   GFR 33.45 (L) 09/28/2020   Lab Results  Component Value Date   CHOL 109 10/09/2020   Lab Results  Component Value Date   HDL 27.90 (L) 10/09/2020   Lab Results  Component Value Date   LDLCALC 54 10/09/2020   Lab Results  Component Value Date   TRIG 131.0 10/09/2020   Lab Results  Component Value Date   CHOLHDL 4 10/09/2020   Lab Results  Component Value Date   HGBA1C 7.0 (H) 09/28/2020      Assessment & Plan:   Problem List Items Addressed This Visit       Cardiovascular and Mediastinum   Essential hypertension     Endocrine   Type 2 diabetes mellitus with diabetic neuropathy, without long-term current use of insulin ([place]) - Primary   Relevant Orders   Basic metabolic panel  Hemoglobin A1c   Urinalysis, Routine w reflex microscopic   Microalbumin / creatinine urine ratio     Genitourinary   Stage 3b chronic kidney disease (HCC)   Relevant Orders   Basic metabolic panel   CBC   Urinalysis, Routine w reflex microscopic   Microalbumin / creatinine urine ratio     Other   Dyslipidemia   Relevant Orders   LDL cholesterol, direct   Other Visit Diagnoses     Muscle cramps       Relevant Orders   Magnesium       No orders of the defined types were placed in this encounter.   Follow-up: Return in about 3 months (around 04/05/2021).  Although patient is doing well with Actos, with his history of diastolic HF with an ejection fraction of 45 to 50%, it may be prudent to  switch to [place].  Results of hemoglobin A1c are pending.  Hopefully there will be improvement of his GFR.  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  discussed above with patient.  Libby Maw, MD  11/3 addendum: We will discontinue Actos and start empagliflozin because it is a better choice.

## 2021-01-04 MED ORDER — EMPAGLIFLOZIN 10 MG PO TABS: 10.0000 mg | ORAL_TABLET | Freq: Every day | ORAL | 5 refills | Status: DC

## 2021-01-04 NOTE — Progress Notes (Signed)
Renal function has improved with adequate hydration.  Please continue.  As discussed, lets discontinue Actos, stop it.  Have sent in a medicine called empagliflozin that should help your kidneys and help to control prediabetes.  Please take it in the morning.  May notice increased frequency of urination.  Let me know if it causes problems.  We will see you in 3 months

## 2021-01-04 NOTE — Addendum Note (Signed)
Addended by: Jon Billings on: 01/04/2021 12:52 PM   Modules accepted: Orders

## 2021-01-09 ENCOUNTER — Telehealth: Payer: Self-pay | Admitting: Family Medicine

## 2021-01-09 NOTE — Telephone Encounter (Signed)
Left message for patient to call back and schedule Medicare Annual Wellness Visit (AWV) in office.  ° °If not able to come in office, please offer to do virtually or by telephone.  Left office number and my jabber #[phone]. ° °Due for AWVI ° °Please schedule at anytime with Nurse Health Advisor. °  °

## 2021-01-17 ENCOUNTER — Other Ambulatory Visit: Payer: Self-pay

## 2021-01-17 ENCOUNTER — Ambulatory Visit (HOSPITAL_COMMUNITY)
Admission: RE | Admit: 2021-01-17 | Discharge: 2021-01-17 | Disposition: A | Discharge status: Home / Self Care | Payer: Medicare HMO | Source: Ambulatory Visit | Attending: Cardiovascular Disease | Admitting: Cardiovascular Disease

## 2021-01-17 DIAGNOSIS — Z95828 Presence of other vascular implants and grafts: Secondary | ICD-10-CM

## 2021-01-23 DIAGNOSIS — Z961 Presence of intraocular lens: Secondary | ICD-10-CM | POA: Diagnosis not present

## 2021-01-23 DIAGNOSIS — H18413 Arcus senilis, bilateral: Secondary | ICD-10-CM | POA: Diagnosis not present

## 2021-01-23 DIAGNOSIS — H2512 Age-related nuclear cataract, left eye: Secondary | ICD-10-CM | POA: Diagnosis not present

## 2021-01-23 DIAGNOSIS — H25012 Cortical age-related cataract, left eye: Secondary | ICD-10-CM | POA: Diagnosis not present

## 2021-01-24 ENCOUNTER — Telehealth: Payer: Self-pay | Admitting: Family Medicine

## 2021-01-24 NOTE — Telephone Encounter (Signed)
I spoke to patient and  patient will call back and schedule Medicare Annual Wellness Visit (AWV)  after Thanksgiving.   If not able to come in office, please offer to do virtually or by telephone.  Left office number and my jabber [phone].  Last AWV:11/02/2014  Please schedule at anytime with Nurse Health Advisor.

## 2021-02-21 ENCOUNTER — Other Ambulatory Visit: Payer: Self-pay | Admitting: Family Medicine

## 2021-02-21 ENCOUNTER — Telehealth: Payer: Self-pay | Admitting: Family Medicine

## 2021-02-26 ENCOUNTER — Other Ambulatory Visit: Payer: Self-pay | Admitting: Family

## 2021-02-26 MED ORDER — CLOPIDOGREL BISULFATE 75 MG PO TABS: ORAL_TABLET | ORAL | 0 refills | Status: DC

## 2021-02-26 MED ORDER — PANTOPRAZOLE SODIUM 40 MG PO TBEC: 40.0000 mg | DELAYED_RELEASE_TABLET | Freq: Every day | ORAL | 0 refills | Status: DC

## 2021-02-26 MED ORDER — METOPROLOL TARTRATE 50 MG PO TABS: 50.0000 mg | ORAL_TABLET | Freq: Two times a day (BID) | ORAL | 0 refills | Status: DC

## 2021-03-19 ENCOUNTER — Telehealth: Payer: Self-pay | Admitting: Family Medicine

## 2021-03-19 NOTE — Telephone Encounter (Signed)
Left message for patient to call back and schedule Medicare Annual Wellness Visit (AWV) in office.  ° °If not able to come in office, please offer to do virtually or by telephone.  Left office number and my jabber #[phone]. ° °Due for AWVI ° °Please schedule at anytime with Nurse Health Advisor. °  °

## 2021-04-04 ENCOUNTER — Other Ambulatory Visit: Payer: Self-pay

## 2021-04-05 ENCOUNTER — Telehealth: Payer: Self-pay | Admitting: Family Medicine

## 2021-04-05 ENCOUNTER — Ambulatory Visit (INDEPENDENT_AMBULATORY_CARE_PROVIDER_SITE_OTHER): Payer: Medicare HMO | Admitting: Family Medicine

## 2021-04-05 ENCOUNTER — Ambulatory Visit (INDEPENDENT_AMBULATORY_CARE_PROVIDER_SITE_OTHER): Payer: Medicare HMO

## 2021-04-05 ENCOUNTER — Encounter: Payer: Self-pay | Admitting: Family Medicine

## 2021-04-05 VITALS — BP 122/66 | HR 57 | Temp 97.3°F | Ht 70.0 in | Wt 184.8 lb

## 2021-04-05 DIAGNOSIS — N1832 Chronic kidney disease, stage 3b: Secondary | ICD-10-CM

## 2021-04-05 DIAGNOSIS — E782 Mixed hyperlipidemia: Secondary | ICD-10-CM

## 2021-04-05 DIAGNOSIS — M1990 Unspecified osteoarthritis, unspecified site: Secondary | ICD-10-CM | POA: Diagnosis not present

## 2021-04-05 DIAGNOSIS — M79642 Pain in left hand: Secondary | ICD-10-CM

## 2021-04-05 DIAGNOSIS — E1140 Type 2 diabetes mellitus with diabetic neuropathy, unspecified: Secondary | ICD-10-CM

## 2021-04-05 DIAGNOSIS — M7989 Other specified soft tissue disorders: Secondary | ICD-10-CM | POA: Diagnosis not present

## 2021-04-05 DIAGNOSIS — I10 Essential (primary) hypertension: Secondary | ICD-10-CM

## 2021-04-05 LAB — BASIC METABOLIC PANEL
BUN: 36 mg/dL — ABNORMAL HIGH (ref 6–23)
CO2: 27 mEq/L (ref 19–32)
Calcium: 9.4 mg/dL (ref 8.4–10.5)
Chloride: 99 mEq/L (ref 96–112)
Creatinine, Ser: 1.73 mg/dL — ABNORMAL HIGH (ref 0.40–1.50)
GFR: 36.6 mL/min — ABNORMAL LOW (ref >60.00)
Glucose, Bld: 110 mg/dL — ABNORMAL HIGH (ref 70–99)
Potassium: 4.4 mEq/L (ref 3.5–5.1)
Sodium: 137 mEq/L (ref 135–145)

## 2021-04-05 LAB — CBC
HCT: 46.8 % (ref 39.0–52.0)
Hemoglobin: 15.6 g/dL (ref 13.0–17.0)
MCHC: 33.3 g/dL (ref 30.0–36.0)
MCV: 90.2 fl (ref 78.0–100.0)
Platelets: 135 10*3/uL — ABNORMAL LOW (ref 150.0–400.0)
RBC: 5.18 Mil/uL (ref 4.22–5.81)
RDW: 13 % (ref 11.5–15.5)
WBC: 5.7 10*3/uL (ref 4.0–10.5)

## 2021-04-05 LAB — LIPID PANEL
Cholesterol: 115 mg/dL (ref 0–200)
HDL: 30.2 mg/dL — ABNORMAL LOW (ref >39.00)
LDL Cholesterol: 59 mg/dL (ref 0–99)
NonHDL: 84.95
Total CHOL/HDL Ratio: 4
Triglycerides: 128 mg/dL (ref 0.0–149.0)
VLDL: 25.6 mg/dL (ref 0.0–40.0)

## 2021-04-05 LAB — LDL CHOLESTEROL, DIRECT: Direct LDL: 72 mg/dL

## 2021-04-05 LAB — HEMOGLOBIN A1C: Hgb A1c MFr Bld: 6.5 % (ref 4.6–6.5)

## 2021-04-05 MED ORDER — DICLOFENAC SODIUM 1 % EX GEL: CUTANEOUS | 1 refills | Status: DC

## 2021-04-05 NOTE — Progress Notes (Signed)
Established Patient Office Visit  Subjective:  Patient ID: [name] Little, male    DOB: [birth year]  Age: 81 y.o. MRN: [id number]  CC:  Chief Complaint  Patient presents with   Follow-up    3 month follow up, no concerns. Patient fasting.     HPI [name] Little presents for follow-up of hypertension, renal insufficiency, diabetes and hyperlipidemia.  Continues to have left hand pain and stiffness particularly in fifth and fourth digits.  He has been trying to work with a softball but there is pain.  Blood pressure controlled with amlodipine and Avapro and metoprolol.  Continues with atorvastatin fish oil.  Status post DC of Actos and starting empagliflozin, he has tolerated well without side effect.  Expensive.  $600 per year hopefully will benefit his diabetes, kidneys  Past Medical History:  Diagnosis Date   Allergic rhinitis    Anxiety and depression    Arthritis    CAD (coronary artery disease)    Diabetes mellitus    GERD (gastroesophageal reflux disease)    Headache(784.0) 06/2003   s/p neuro eval ? migraine   Hyperlipidemia    Hyperplastic colon polyp 2008   Hypertension    Iron deficiency    h/o   Ischemic cardiomyopathy    EF had 30-35%. The most recent EF was 50% on echo in september 2009   PUD (peptic ulcer disease) 7/11   EGD showed few small ulcers, esophagues strecthed   Vision abnormalities     Past Surgical History:  Procedure Laterality Date   ABDOMINAL AORTAGRAM  02/03/2020   ABDOMINAL AORTOGRAM W/LOWER EXTREMITY N/A 02/03/2020   Procedure: ABDOMINAL AORTOGRAM W/LOWER EXTREMITY;  Surgeon: Lorretta Harp, MD;  Location: [facility];  Service: Cardiovascular;  Laterality: N/A;   CATARACT EXTRACTION Right 07-2012   CORONARY ARTERY BYPASS GRAFT  2004   LIMA to the LADs, sequentail SVG to intermediated and obtuse marginal, sequential SVG to PDA and posterolaterla   INGUINAL HERNIA REPAIR Bilateral    PERIPHERAL VASCULAR INTERVENTION  Right 02/03/2020   Procedure: PERIPHERAL VASCULAR INTERVENTION;  Surgeon: Lorretta Harp, MD;  Location: [facility];  Service: Cardiovascular;  Laterality: Right;   TONSILLECTOMY      Family History  Problem Relation Age of Onset   Diabetes Father    Heart attack Father    Heart attack Mother    Cancer Paternal Grandfather        type unknown   Colon cancer Neg Hx    Prostate cancer Neg Hx     Social History   Socioeconomic History   Marital status: Married    Spouse name: Not on file   Number of children: 0   Years of education: Not on file   Highest education level: Not on file  Occupational History   Occupation: retired Freight forwarder   Tobacco Use   Smoking status: Former    Types: Cigarettes    Quit date: 12/16/1976    Years since quitting: 44.3   Smokeless tobacco: Never  Vaping Use   Vaping Use: Never used  Substance and Sexual Activity   Alcohol use: No    Alcohol/week: 0.0 standard drinks   Drug use: No   Sexual activity: Not on file  Other Topics Concern   Not on file  Social History Narrative   Married , lives w/ wife , no children         Social Determinants of Radio broadcast assistant  Strain: Not on file  Food Insecurity: Not on file  Transportation Needs: Not on file  Physical Activity: Not on file  Stress: Not on file  Social Connections: Not on file  Intimate Partner Violence: Not on file    Outpatient Medications Prior to Visit  Medication Sig Dispense Refill   acetaminophen (TYLENOL) 500 MG tablet Take 1,000 mg by mouth every 6 (six) hours as needed for mild pain, moderate pain or headache (Allergies).      amLODipine (NORVASC) 2.5 MG tablet Take 1 tablet (2.5 mg total) by mouth daily. 90 tablet 0   aspirin EC 81 MG EC tablet Take 1 tablet (81 mg total) by mouth daily. Swallow whole. 90 tablet 3   atorvastatin (LIPITOR) 40 MG tablet TAKE ONE TABLET BY MOUTH AT BEDTIME 90 tablet 0   azelastine (ASTELIN) 0.1 % nasal spray Place 2  sprays into both nostrils at bedtime as needed for rhinitis or allergies. 30 mL 12   clopidogrel (PLAVIX) 75 MG tablet TAKE ONE TABLET BY MOUTH ONE TIME DAILY WITH BREAKFAST 90 tablet 0   Coenzyme Q10 (COQ10) 400 MG CAPS Take 400 mg by mouth daily.      empagliflozin (JARDIANCE) 10 MG TABS tablet Take 1 tablet (10 mg total) by mouth daily before breakfast. 30 tablet 5   enalapril (VASOTEC) 10 MG tablet TAKE ONE TABLET BY MOUTH TWICE A DAY 360 tablet 0   fish oil-omega-3 fatty acids 1000 MG capsule Take 1 g by mouth daily.     Flaxseed, Linseed, (FLAX SEED OIL PO) Take 1,300 mg by mouth daily at 12 noon. Omega 3     fluticasone (FLONASE) 50 MCG/ACT nasal spray Place 1 spray into both nostrils daily.   6   furosemide (LASIX) 20 MG tablet TAKE ONE TABLET BY MOUTH ONE TIME DAILY 90 tablet 1   metoprolol tartrate (LOPRESSOR) 50 MG tablet Take 1 tablet (50 mg total) by mouth 2 (two) times daily. 180 tablet 0   pantoprazole (PROTONIX) 40 MG tablet Take 1 tablet (40 mg total) by mouth daily. 90 tablet 0   Vitamin D3 (VITAMIN D) 25 MCG tablet Take 1,000 Units by mouth daily.     Facility-Administered Medications Prior to Visit  Medication Dose Route Frequency Provider Last Rate Last Admin   sodium chloride flush (NS) 0.9 % injection 3 mL  3 mL Intravenous Q12H Lorretta Harp, MD        Allergies  Allergen Reactions   Micardis [Telmisartan]     REACTION: diarrhea    ROS Review of Systems  Constitutional:  Negative for diaphoresis, fatigue, fever and unexpected weight change.  HENT: Negative.    Eyes:  Negative for photophobia and visual disturbance.  Respiratory: Negative.    Cardiovascular: Negative.   Gastrointestinal: Negative.   Endocrine: Negative for polyphagia and polyuria.  Genitourinary: Negative.   Musculoskeletal:  Positive for arthralgias. Negative for joint swelling.  Skin:  Negative for pallor and rash.  Neurological:  Negative for speech difficulty and weakness.   Hematological:  Does not bruise/bleed easily.  Psychiatric/Behavioral: Negative.       Objective:    Physical Exam Vitals and nursing note reviewed.  Constitutional:      Appearance: Normal appearance.  HENT:     Head: Normocephalic and atraumatic.     Right Ear: External ear normal.     Left Ear: External ear normal.  Eyes:     General:        Right eye:  No discharge.        Left eye: No discharge.     Extraocular Movements: Extraocular movements intact.     Conjunctiva/sclera: Conjunctivae normal.     Pupils: Pupils are equal, round, and reactive to light.  Cardiovascular:     Rate and Rhythm: Normal rate and regular rhythm.  Pulmonary:     Effort: Pulmonary effort is normal.     Breath sounds: Normal breath sounds.  Musculoskeletal:     Left hand: Swelling and deformity present. No tenderness. Decreased range of motion. Normal strength.       Arms:     Cervical back: No rigidity or tenderness.  Lymphadenopathy:     Cervical: No cervical adenopathy.  Skin:    General: Skin is warm and dry.  Neurological:     Mental Status: He is alert and oriented to person, place, and time.  Psychiatric:        Behavior: Behavior normal.    BP 122/66 (BP Location: Right Arm, Patient Position: Sitting, Cuff Size: Normal)    Pulse (!) 57    Temp (!) 97.3 F (36.3 C) (Temporal)    Ht 5\' 10"  (1.778 m)    Wt 184 lb 12.8 oz (83.8 kg)    SpO2 97%    BMI 26.52 kg/m  Wt Readings from Last 3 Encounters:  04/05/21 184 lb 12.8 oz (83.8 kg)  01/03/21 186 lb 9.6 oz (84.6 kg)  12/04/20 192 lb 9.6 oz (87.4 kg)     Health Maintenance Due  Topic Date Due   Zoster Vaccines- Shingrix (1 of 2) Never done    There are no preventive care reminders to display for this patient.  Lab Results  Component Value Date   TSH 0.50 10/21/2019   Lab Results  Component Value Date   WBC 5.8 01/03/2021   HGB 15.4 01/03/2021   HCT 45.9 01/03/2021   MCV 93.2 01/03/2021   PLT 129.0 (L) 01/03/2021    Lab Results  Component Value Date   NA 137 01/03/2021   K 4.6 01/03/2021   CO2 29 01/03/2021   GLUCOSE 107 (H) 01/03/2021   BUN 27 (H) 01/03/2021   CREATININE 1.49 01/03/2021   BILITOT 1.2 06/01/2019   ALKPHOS 94 06/01/2019   AST 18 06/01/2019   ALT 21 06/01/2019   PROT 6.7 06/01/2019   ALBUMIN 4.3 06/01/2019   CALCIUM 9.6 01/03/2021   ANIONGAP 9 02/04/2020   GFR 43.86 (L) 01/03/2021   Lab Results  Component Value Date   CHOL 109 10/09/2020   Lab Results  Component Value Date   HDL 27.90 (L) 10/09/2020   Lab Results  Component Value Date   LDLCALC 54 10/09/2020   Lab Results  Component Value Date   TRIG 131.0 10/09/2020   Lab Results  Component Value Date   CHOLHDL 4 10/09/2020   Lab Results  Component Value Date   HGBA1C 6.5 01/03/2021      Assessment & Plan:   Problem List Items Addressed This Visit       Cardiovascular and Mediastinum   Essential hypertension   Relevant Orders   Basic metabolic panel   CBC     Endocrine   Type 2 diabetes mellitus with diabetic neuropathy, without long-term current use of insulin (HCC)   Relevant Orders   Basic metabolic panel   Hemoglobin A1c     Musculoskeletal and Integument   Arthritis - Primary     Genitourinary   Stage 3b chronic kidney  disease ([place])   Relevant Orders   Basic metabolic panel     Other   Hyperlipidemia   Relevant Orders   Lipid panel   LDL cholesterol, direct   Left hand pain   Relevant Medications   diclofenac Sodium (VOLTAREN) 1 % GEL   Other Relevant Orders   DG Hand Complete Left    Meds ordered this encounter  Medications   diclofenac Sodium (VOLTAREN) 1 % GEL    Sig: Apply a large pea-sized to tender place on pends up to 4 times daily as needed.    Dispense:  150 g    Refill:  1    Follow-up: Return in about 3 months (around 07/03/2021).   Continue medicines as above.  Hopefully positive to empagliflozin will be sent Libby Maw, MD

## 2021-04-05 NOTE — Telephone Encounter (Signed)
[employer] is needing clarification of the directions for VOLTAREN. What is a "pea sized" they need to know the exact amount. Please advise [employer] at Phone:  [phone]

## 2021-04-05 NOTE — Telephone Encounter (Signed)
Spoke with pharmacist who verbally understood 1 gm of Voltaren gel.

## 2021-04-19 ENCOUNTER — Other Ambulatory Visit: Payer: Self-pay | Admitting: Family Medicine

## 2021-04-23 DIAGNOSIS — H2512 Age-related nuclear cataract, left eye: Secondary | ICD-10-CM | POA: Diagnosis not present

## 2021-04-24 ENCOUNTER — Other Ambulatory Visit: Payer: Self-pay | Admitting: Family Medicine

## 2021-05-07 ENCOUNTER — Telehealth: Payer: Self-pay | Admitting: Family Medicine

## 2021-05-07 NOTE — Telephone Encounter (Signed)
Left message for patient to call back and schedule Medicare Annual Wellness Visit (AWV) in office.  ° °If not able to come in office, please offer to do virtually or by telephone.  Left office number and my jabber #[phone]. ° °AWVI eligible as of 03/04/2009 ° °Please schedule at anytime with Nurse Health Advisor. °  °

## 2021-05-15 DIAGNOSIS — Z0100 Encounter for examination of eyes and vision without abnormal findings: Secondary | ICD-10-CM | POA: Diagnosis not present

## 2021-06-04 ENCOUNTER — Telehealth: Payer: Self-pay | Admitting: Family Medicine

## 2021-06-04 NOTE — Telephone Encounter (Signed)
Left message for patient to call back and schedule Medicare Annual Wellness Visit (AWV) in office.  ? ?If not able to come in office, please offer to do virtually or by telephone.  Left office number and my jabber [phone]. ? ?Last AWV:11/02/2014 ? ?Please schedule at anytime with Nurse Health Advisor. ?  ?

## 2021-06-11 ENCOUNTER — Other Ambulatory Visit: Payer: Self-pay | Admitting: Family Medicine

## 2021-06-18 ENCOUNTER — Other Ambulatory Visit: Payer: Self-pay | Admitting: Family Medicine

## 2021-06-18 DIAGNOSIS — E1140 Type 2 diabetes mellitus with diabetic neuropathy, unspecified: Secondary | ICD-10-CM

## 2021-07-03 ENCOUNTER — Ambulatory Visit (INDEPENDENT_AMBULATORY_CARE_PROVIDER_SITE_OTHER): Payer: Medicare HMO | Admitting: Family Medicine

## 2021-07-03 ENCOUNTER — Encounter: Payer: Self-pay | Admitting: Family Medicine

## 2021-07-03 VITALS — BP 130/72 | HR 56 | Temp 96.3°F | Ht 70.0 in | Wt 188.0 lb

## 2021-07-03 DIAGNOSIS — N1832 Chronic kidney disease, stage 3b: Secondary | ICD-10-CM

## 2021-07-03 DIAGNOSIS — E538 Deficiency of other specified B group vitamins: Secondary | ICD-10-CM | POA: Diagnosis not present

## 2021-07-03 DIAGNOSIS — E1140 Type 2 diabetes mellitus with diabetic neuropathy, unspecified: Secondary | ICD-10-CM | POA: Diagnosis not present

## 2021-07-03 LAB — BASIC METABOLIC PANEL
BUN: 30 mg/dL — ABNORMAL HIGH (ref 6–23)
CO2: 29 mEq/L (ref 19–32)
Calcium: 9.3 mg/dL (ref 8.4–10.5)
Chloride: 101 mEq/L (ref 96–112)
Creatinine, Ser: 1.76 mg/dL — ABNORMAL HIGH (ref 0.40–1.50)
GFR: 35.79 mL/min — ABNORMAL LOW (ref >60.00)
Glucose, Bld: 123 mg/dL — ABNORMAL HIGH (ref 70–99)
Potassium: 4.1 mEq/L (ref 3.5–5.1)
Sodium: 139 mEq/L (ref 135–145)

## 2021-07-03 LAB — HEMOGLOBIN A1C: Hgb A1c MFr Bld: 6.8 % — ABNORMAL HIGH (ref 4.6–6.5)

## 2021-07-03 LAB — VITAMIN B12: Vitamin B-12: 153 pg/mL — ABNORMAL LOW (ref 211–911)

## 2021-07-03 MED ORDER — EMPAGLIFLOZIN 10 MG PO TABS: ORAL_TABLET | ORAL | 5 refills | Status: DC

## 2021-07-03 NOTE — Progress Notes (Addendum)
? ?Established Patient Office Visit ? ?Subjective   ?Patient ID: [name] Little, male    DOB: 01-24-1941  Age: 81 y.o. MRN: [id number] ? ?Chief Complaint  ?Patient presents with  ? Follow-up  ?  3 month F/u. Has questions about tingling in both feet.   ? ? ?HPI follow-up for type 2 diabetes and CKD.  Continues to tolerate the [employer] well.  Continues to hydrate well.  Has noticed some tingling in his feet when he takes his shoes off and goes to bed at night.  Typically lasts about 5 minutes and then resolves.  He has rare pinprick sensations. ? ? ? ?Review of Systems  ?Constitutional: Negative.   ?HENT: Negative.    ?Eyes:  Negative for blurred vision, discharge and redness.  ?Respiratory: Negative.    ?Cardiovascular: Negative.   ?Gastrointestinal:  Negative for abdominal pain.  ?Genitourinary: Negative.   ?Musculoskeletal:  Positive for joint pain. Negative for myalgias.  ?Skin:  Negative for rash.  ?Neurological:  Positive for tingling. Negative for loss of consciousness and weakness.  ?Endo/Heme/Allergies:  Negative for polydipsia.  ? ?  ?Objective:  ?  ? ?BP 130/72 (BP Location: Right Arm, Patient Position: Sitting, Cuff Size: Small)   Pulse (!) 56   Temp (!) 96.3 ?F (35.7 ?C) (Temporal)   Ht '5\' 10"'$  (1.778 m)   Wt 188 lb (85.3 kg)   SpO2 99%   BMI 26.98 kg/m?  ? ? ?Physical Exam ?Constitutional:   ?   General: He is not in acute distress. ?   Appearance: Normal appearance. He is not ill-appearing, toxic-appearing or diaphoretic.  ?HENT:  ?   Head: Normocephalic and atraumatic.  ?   Right Ear: External ear normal.  ?   Left Ear: External ear normal.  ?   Nose: Nose normal.  ?Eyes:  ?   General: No scleral icterus.    ?   Right eye: No discharge.     ?   Left eye: No discharge.  ?   Extraocular Movements: Extraocular movements intact.  ?   Conjunctiva/sclera: Conjunctivae normal.  ?Cardiovascular:  ?   Rate and Rhythm: Normal rate and regular rhythm.  ?Pulmonary:  ?   Effort: Pulmonary effort is  normal. No respiratory distress.  ?   Breath sounds: Normal breath sounds.  ?Abdominal:  ?   General: Bowel sounds are normal.  ?Musculoskeletal:  ?   Cervical back: No rigidity or tenderness.  ?Skin: ?   General: Skin is warm and dry.  ?Neurological:  ?   Mental Status: He is alert and oriented to person, place, and time.  ?Psychiatric:     ?   Mood and Affect: Mood normal.     ?   Behavior: Behavior normal.  ? ? ? ?Results for orders placed or performed in visit on 07/03/21  ?Hemoglobin A1c  ?Result Value Ref Range  ? Hgb A1c MFr Bld 6.8 (H) 4.6 - 6.5 %  ?Basic metabolic panel  ?Result Value Ref Range  ? Sodium 139 135 - 145 mEq/L  ? Potassium 4.1 3.5 - 5.1 mEq/L  ? Chloride 101 96 - 112 mEq/L  ? CO2 29 19 - 32 mEq/L  ? Glucose, Bld 123 (H) 70 - 99 mg/dL  ? BUN 30 (H) 6 - 23 mg/dL  ? Creatinine, Ser 1.76 (H) 0.40 - 1.50 mg/dL  ? GFR 35.79 (L) >60.00 mL/min  ? Calcium 9.3 8.4 - 10.5 mg/dL  ?Vitamin B12  ?Result Value Ref  Range  ? Vitamin B-12 153 (L) 211 - 911 pg/mL  ? ? ? ? ?The ASCVD Risk score (Arnett DK, et al., 2019) failed to calculate for the following reasons: ?  The 2019 ASCVD risk score is only valid for ages 54 to 3 ? ?  ?Assessment & Plan:  ? ?Problem List Items Addressed This Visit   ? ?  ? Endocrine  ? Type 2 diabetes mellitus with diabetic neuropathy, without long-term current use of insulin ([place]) - Primary  ? Relevant Medications  ? empagliflozin (JARDIANCE) 10 MG TABS tablet  ? Other Relevant Orders  ? Hemoglobin A1c (Completed)  ? Basic metabolic panel (Completed)  ? Vitamin B12 (Completed)  ?  ? Genitourinary  ? Stage 3b chronic kidney disease ([place])  ? Relevant Orders  ? Basic metabolic panel (Completed)  ? ?Other Visit Diagnoses   ? ? B12 deficiency      ? Relevant Medications  ? vitamin B-12 (CYANOCOBALAMIN) 1000 MCG tablet  ? ?  ? ? ?Return in about 6 months (around 01/03/2022).  ?Continue Farxiga.  Discussed treatment options for neuropathy, especially if it becomes painful.  Continue  hydrating well. ? ?Libby Maw, MD ? ?5/5 addendum: Vitamin B12 is low.  This could be the cause of some of the tingling sensations you are feeling.  Have sent in a prescription for high-dose pill to the pharmacy.  Please take this daily and follow-up with me as directed ? ?

## 2021-07-06 MED ORDER — VITAMIN B-12 1000 MCG PO TABS: 1000.0000 ug | ORAL_TABLET | Freq: Every day | ORAL | 1 refills | Status: AC

## 2021-07-06 NOTE — Addendum Note (Signed)
Addended by: Jon Billings on: 07/06/2021 12:36 PM ? ? Modules accepted: Orders ? ?

## 2021-07-11 ENCOUNTER — Other Ambulatory Visit: Payer: Self-pay | Admitting: Family Medicine

## 2021-08-02 ENCOUNTER — Ambulatory Visit (HOSPITAL_COMMUNITY)
Admission: RE | Admit: 2021-08-02 | Discharge: 2021-08-02 | Disposition: A | Discharge status: Home / Self Care | Payer: Medicare HMO | Source: Ambulatory Visit | Attending: Cardiovascular Disease | Admitting: Cardiovascular Disease

## 2021-08-02 ENCOUNTER — Ambulatory Visit (HOSPITAL_BASED_OUTPATIENT_CLINIC_OR_DEPARTMENT_OTHER)
Admission: RE | Admit: 2021-08-02 | Discharge: 2021-08-02 | Disposition: A | Discharge status: Home / Self Care | Payer: Medicare HMO | Source: Ambulatory Visit | Attending: Cardiovascular Disease | Admitting: Cardiovascular Disease

## 2021-08-02 DIAGNOSIS — Z95828 Presence of other vascular implants and grafts: Secondary | ICD-10-CM | POA: Insufficient documentation

## 2021-08-08 ENCOUNTER — Other Ambulatory Visit: Payer: Self-pay | Admitting: Family Medicine

## 2021-08-15 ENCOUNTER — Other Ambulatory Visit: Payer: Self-pay

## 2021-08-15 DIAGNOSIS — I2510 Atherosclerotic heart disease of native coronary artery without angina pectoris: Secondary | ICD-10-CM

## 2021-08-15 DIAGNOSIS — Z0000 Encounter for general adult medical examination without abnormal findings: Secondary | ICD-10-CM

## 2021-08-15 DIAGNOSIS — K219 Gastro-esophageal reflux disease without esophagitis: Secondary | ICD-10-CM

## 2021-08-15 DIAGNOSIS — I10 Essential (primary) hypertension: Secondary | ICD-10-CM

## 2021-08-15 MED ORDER — CLOPIDOGREL BISULFATE 75 MG PO TABS: ORAL_TABLET | ORAL | 0 refills | Status: DC

## 2021-08-15 MED ORDER — PANTOPRAZOLE SODIUM 40 MG PO TBEC: 40.0000 mg | DELAYED_RELEASE_TABLET | Freq: Every day | ORAL | 0 refills | Status: DC

## 2021-08-15 MED ORDER — METOPROLOL TARTRATE 50 MG PO TABS: 50.0000 mg | ORAL_TABLET | Freq: Two times a day (BID) | ORAL | 0 refills | Status: DC

## 2021-09-04 ENCOUNTER — Other Ambulatory Visit: Payer: Self-pay | Admitting: Family Medicine

## 2021-09-05 ENCOUNTER — Encounter: Payer: Self-pay | Admitting: Family Medicine

## 2021-09-06 ENCOUNTER — Ambulatory Visit (HOSPITAL_COMMUNITY): Payer: Medicare HMO

## 2021-09-06 ENCOUNTER — Ambulatory Visit (INDEPENDENT_AMBULATORY_CARE_PROVIDER_SITE_OTHER): Payer: Medicare HMO | Admitting: Family Medicine

## 2021-09-06 ENCOUNTER — Encounter: Payer: Self-pay | Admitting: Family Medicine

## 2021-09-06 ENCOUNTER — Ambulatory Visit (INDEPENDENT_AMBULATORY_CARE_PROVIDER_SITE_OTHER): Payer: Medicare HMO

## 2021-09-06 VITALS — BP 124/80 | HR 60 | Temp 97.4°F | Ht 70.0 in | Wt 185.0 lb

## 2021-09-06 DIAGNOSIS — E538 Deficiency of other specified B group vitamins: Secondary | ICD-10-CM | POA: Diagnosis not present

## 2021-09-06 DIAGNOSIS — R0989 Other specified symptoms and signs involving the circulatory and respiratory systems: Secondary | ICD-10-CM | POA: Insufficient documentation

## 2021-09-06 DIAGNOSIS — M25572 Pain in left ankle and joints of left foot: Secondary | ICD-10-CM

## 2021-09-06 DIAGNOSIS — N1832 Chronic kidney disease, stage 3b: Secondary | ICD-10-CM | POA: Diagnosis not present

## 2021-09-06 DIAGNOSIS — M7732 Calcaneal spur, left foot: Secondary | ICD-10-CM | POA: Diagnosis not present

## 2021-09-06 LAB — BASIC METABOLIC PANEL
BUN: 28 mg/dL — ABNORMAL HIGH (ref 6–23)
CO2: 25 mEq/L (ref 19–32)
Calcium: 9.6 mg/dL (ref 8.4–10.5)
Chloride: 99 mEq/L (ref 96–112)
Creatinine, Ser: 1.61 mg/dL — ABNORMAL HIGH (ref 0.40–1.50)
GFR: 39.77 mL/min — ABNORMAL LOW (ref >60.00)
Glucose, Bld: 120 mg/dL — ABNORMAL HIGH (ref 70–99)
Potassium: 4.3 mEq/L (ref 3.5–5.1)
Sodium: 136 mEq/L (ref 135–145)

## 2021-09-06 LAB — URIC ACID: Uric Acid, Serum: 8 mg/dL — ABNORMAL HIGH (ref 4.0–7.8)

## 2021-09-06 MED ORDER — COLCHICINE 0.6 MG PO TABS: 0.6000 mg | ORAL_TABLET | Freq: Two times a day (BID) | ORAL | 2 refills | Status: DC

## 2021-09-06 MED ORDER — DICLOFENAC SODIUM 1 % EX GEL: CUTANEOUS | 1 refills | Status: DC

## 2021-09-06 NOTE — Progress Notes (Unsigned)
Initial visit for 5-6 day hx of LT medial ankle pain and swelling . There has been no injury. Hx of gout

## 2021-09-06 NOTE — Progress Notes (Signed)
Established Patient Office Visit  Subjective   Patient ID: [name] Little, male    DOB: December 19, 1940  Age: 81 y.o. MRN: [id number]  Chief Complaint  Patient presents with   Acute Visit    C/o left foot pain/ discomfort & swelling x 1 week, thinks it may be gout No other concerns     HPI evaluation of a 5 to 6-day history of pain and swelling in his medial left ankle.  There has been no injury.  It is red and swollen.  Denies fevers chills or injury.  Seems to be slowly resolving.  He has no history of gout.  He has been taking his B12 as directed.    Review of Systems  Constitutional: Negative.   HENT: Negative.    Eyes:  Negative for blurred vision, discharge and redness.  Respiratory: Negative.    Cardiovascular: Negative.   Gastrointestinal:  Negative for abdominal pain.  Genitourinary: Negative.   Musculoskeletal:  Positive for joint pain. Negative for myalgias.  Skin:  Negative for itching and rash.  Neurological:  Negative for tingling, loss of consciousness and weakness.  Endo/Heme/Allergies:  Negative for polydipsia.      Objective:     BP 124/80 (BP Location: Right Arm, Patient Position: Sitting, Cuff Size: Normal)   Pulse 60   Temp (!) 97.4 F (36.3 C) (Temporal)   Ht '5\' 10"'$  (1.778 m)   Wt 185 lb (83.9 kg)   SpO2 94%   BMI 26.54 kg/m    Physical Exam Constitutional:      General: He is not in acute distress.    Appearance: Normal appearance. He is not ill-appearing, toxic-appearing or diaphoretic.  HENT:     Head: Normocephalic and atraumatic.     Right Ear: External ear normal.     Left Ear: External ear normal.     Mouth/Throat:     Mouth: Mucous membranes are moist.     Pharynx: Oropharynx is clear. No oropharyngeal exudate or posterior oropharyngeal erythema.  Eyes:     General: No scleral icterus.       Right eye: No discharge.        Left eye: No discharge.     Extraocular Movements: Extraocular movements intact.      Conjunctiva/sclera: Conjunctivae normal.     Pupils: Pupils are equal, round, and reactive to light.  Cardiovascular:     Rate and Rhythm: Normal rate and regular rhythm.     Pulses:          Popliteal pulses are 0 on the right side and 0 on the left side.       Dorsalis pedis pulses are 0 on the right side and 0 on the left side.  Pulmonary:     Effort: Pulmonary effort is normal. No respiratory distress.     Breath sounds: Normal breath sounds.  Abdominal:     General: Bowel sounds are normal.     Tenderness: There is no abdominal tenderness. There is no guarding.  Musculoskeletal:     Cervical back: No rigidity or tenderness.  Skin:    General: Skin is warm and dry.  Neurological:     Mental Status: He is alert and oriented to person, place, and time.  Psychiatric:        Mood and Affect: Mood normal.        Behavior: Behavior normal.    Diabetic Foot Exam - Simple   No data filed  No results found for any visits on 09/06/21.    The ASCVD Risk score (Arnett DK, et al., 2019) failed to calculate for the following reasons:   The 2019 ASCVD risk score is only valid for ages 37 to 36    Assessment & Plan:   Problem List Items Addressed This Visit       Genitourinary   Stage 3b chronic kidney disease ([place])   Relevant Orders   Basic metabolic panel     Other   Acute left ankle pain   Relevant Medications   diclofenac Sodium (VOLTAREN) 1 % GEL   colchicine 0.6 MG tablet   Other Relevant Orders   Uric acid   DG Ankle Complete Left   Decreased pedal pulses   Relevant Orders   VAS [place] ABI WITH/WO TBI   B12 deficiency - Primary    Return if symptoms worsen or fail to improve.    Libby Maw, MD

## 2021-09-07 ENCOUNTER — Telehealth: Payer: Self-pay | Admitting: Cardiology

## 2021-09-07 ENCOUNTER — Encounter: Payer: Self-pay | Admitting: Family Medicine

## 2021-09-07 NOTE — Telephone Encounter (Signed)
Pt would like a callback regarding a message that was left canceling an appt that he was schedule for on 09/19/21. Please advise

## 2021-09-12 ENCOUNTER — Ambulatory Visit (INDEPENDENT_AMBULATORY_CARE_PROVIDER_SITE_OTHER): Payer: Medicare HMO | Admitting: Family Medicine

## 2021-09-12 ENCOUNTER — Encounter: Payer: Self-pay | Admitting: Family Medicine

## 2021-09-12 DIAGNOSIS — M109 Gout, unspecified: Secondary | ICD-10-CM | POA: Insufficient documentation

## 2021-09-12 NOTE — Progress Notes (Signed)
   Established Patient Office Visit  Subjective   Patient ID: [name] Little, male    DOB: 1940/03/14  Age: 81 y.o. MRN: [id number]  Chief Complaint  Patient presents with   phone visit    Discuss labs, no concerns      HPI follow-up of right ankle pain and elevated uric acid levels.  Ankle is doing better.  It has responded to Voltaren gel on the colchicine.    Review of Systems  Constitutional: Negative.   Respiratory: Negative.    Cardiovascular: Negative.   Musculoskeletal:  Positive for joint pain.  Skin: Negative.       Objective:     There were no vitals taken for this visit.   Physical Exam Pulmonary:     Effort: Pulmonary effort is normal.  Neurological:     Mental Status: He is alert and oriented to person, place, and time.  Psychiatric:        Mood and Affect: Mood normal.        Behavior: Behavior normal.      No results found for any visits on 09/12/21.    The ASCVD Risk score (Arnett DK, et al., 2019) failed to calculate for the following reasons:   The 2019 ASCVD risk score is only valid for ages 41 to 47    Assessment & Plan:   Problem List Items Addressed This Visit       Musculoskeletal and Integument   Acute gout involving toe - Primary    Return Follow-up as previously recommended.   He will adopt low purine diet.  He will continue Jardiance for heart protection and declining GFR.  We will hold preventative therapy with allopurinol for now. Libby Maw, MD Virtual Visit via Telephone Note  I connected with [name] Little on 09/12/21 at  8:40 AM EDT by telephone and verified that I am speaking with the correct person using two identifiers.  Location: Patient: home with his wife. Provider: work   I discussed the limitations, risks, security and privacy concerns of performing an evaluation and management service by telephone and the availability of in person appointments. I also discussed with the patient  that there may be a patient responsible charge related to this service. The patient expressed understanding and agreed to proceed.   History of Present Illness:    Observations/Objective:   Assessment and Plan:   Follow Up Instructions:    I discussed the assessment and treatment plan with the patient. The patient was provided an opportunity to ask questions and all were answered. The patient agreed with the plan and demonstrated an understanding of the instructions.   The patient was advised to call back or seek an in-person evaluation if the symptoms worsen or if the condition fails to improve as anticipated.  I provided 20 minutes of non-face-to-face time during this encounter.   Libby Maw, MD   Interactive video and audio telecommunications were attempted between myself and the patient. However they failed due to the patient having technical difficulties or not having access to video capability. We continued and completed with audio only. Interactive video and audio telecommunications were attempted between myself and the patient. However they failed due to the patient having technical difficulties or not having access to video capability. We continued and completed with audio only.

## 2021-09-19 ENCOUNTER — Encounter (HOSPITAL_COMMUNITY): Payer: Medicare HMO

## 2021-09-26 ENCOUNTER — Telehealth: Payer: Self-pay | Admitting: Family Medicine

## 2021-09-26 NOTE — Telephone Encounter (Signed)
Left message for patient to call back and schedule Medicare Annual Wellness Visit (AWV).   Please offer to do virtually or by telephone.  Left office number and my jabber [phone].  AWVI eligible as of 11/02/2014  Please schedule at anytime with Nurse Health Advisor.

## 2021-10-11 ENCOUNTER — Telehealth: Payer: Self-pay | Admitting: Family Medicine

## 2021-10-11 DIAGNOSIS — E1140 Type 2 diabetes mellitus with diabetic neuropathy, unspecified: Secondary | ICD-10-CM

## 2021-10-11 DIAGNOSIS — N1832 Chronic kidney disease, stage 3b: Secondary | ICD-10-CM

## 2021-10-11 NOTE — Telephone Encounter (Signed)
Pt is wanting to know if he can get something similar to empagliflozin (JARDIANCE) 10 MG TABS tablet [[id number]]. It's way to expensive for him. He needs a script that's an alternative and cheaper.   [facility] [address] [place], [place]. AT [address]  [address] [place] [address]  Phone:  [phone]  Fax:  [phone]  Please advise pt [phone]

## 2021-10-11 NOTE — Telephone Encounter (Signed)
Please advise message below patient asking if there was something else that could be sent in place of Jardiance.

## 2021-10-12 MED ORDER — DAPAGLIFLOZIN PROPANEDIOL 5 MG PO TABS: 5.0000 mg | ORAL_TABLET | Freq: Every day | ORAL | 5 refills | Status: DC

## 2021-10-12 NOTE — Telephone Encounter (Signed)
Patient aware and will pick up Rx.  

## 2021-10-12 NOTE — Addendum Note (Signed)
Addended by: Jon Billings on: 10/12/2021 07:53 AM   Modules accepted: Orders

## 2021-10-16 ENCOUNTER — Other Ambulatory Visit: Payer: Self-pay | Admitting: Family Medicine

## 2021-10-23 ENCOUNTER — Telehealth: Payer: Self-pay | Admitting: Family Medicine

## 2021-10-23 NOTE — Telephone Encounter (Signed)
Left message for patient to call back and schedule Medicare Annual Wellness Visit (AWV).   Please offer to do virtually or by telephone.  Left office number and my jabber [phone].  Last AWV: 11/02/2014  Please schedule at anytime with Nurse Health Advisor.

## 2021-10-29 NOTE — Telephone Encounter (Signed)
Na

## 2021-11-08 ENCOUNTER — Other Ambulatory Visit: Payer: Self-pay | Admitting: Family Medicine

## 2021-11-08 DIAGNOSIS — K219 Gastro-esophageal reflux disease without esophagitis: Secondary | ICD-10-CM

## 2021-11-08 DIAGNOSIS — I2510 Atherosclerotic heart disease of native coronary artery without angina pectoris: Secondary | ICD-10-CM

## 2021-11-08 DIAGNOSIS — I10 Essential (primary) hypertension: Secondary | ICD-10-CM

## 2021-12-03 ENCOUNTER — Other Ambulatory Visit: Payer: Self-pay | Admitting: Family Medicine

## 2021-12-12 ENCOUNTER — Telehealth: Payer: Self-pay | Admitting: Family Medicine

## 2021-12-27 ENCOUNTER — Telehealth: Payer: Self-pay | Admitting: Family Medicine

## 2021-12-27 NOTE — Telephone Encounter (Signed)
Left message for patient to call back and schedule Medicare Annual Wellness Visit (AWV).   Please offer to do virtually or by telephone.  Left office number and my jabber [phone].  Last AWV:11/02/2014  Please schedule at anytime with Nurse Health Advisor.

## 2022-01-01 ENCOUNTER — Encounter: Payer: Self-pay | Admitting: Family Medicine

## 2022-01-01 ENCOUNTER — Ambulatory Visit (INDEPENDENT_AMBULATORY_CARE_PROVIDER_SITE_OTHER): Payer: Medicare HMO | Admitting: Family Medicine

## 2022-01-01 VITALS — BP 110/68 | HR 55 | Temp 97.2°F | Ht 70.0 in | Wt 185.0 lb

## 2022-01-01 DIAGNOSIS — E538 Deficiency of other specified B group vitamins: Secondary | ICD-10-CM

## 2022-01-01 DIAGNOSIS — E1140 Type 2 diabetes mellitus with diabetic neuropathy, unspecified: Secondary | ICD-10-CM | POA: Diagnosis not present

## 2022-01-01 DIAGNOSIS — N1832 Chronic kidney disease, stage 3b: Secondary | ICD-10-CM

## 2022-01-01 DIAGNOSIS — I10 Essential (primary) hypertension: Secondary | ICD-10-CM

## 2022-01-01 DIAGNOSIS — E782 Mixed hyperlipidemia: Secondary | ICD-10-CM

## 2022-01-01 DIAGNOSIS — K1120 Sialoadenitis, unspecified: Secondary | ICD-10-CM

## 2022-01-01 LAB — HEMOGLOBIN A1C: Hgb A1c MFr Bld: 7 % — ABNORMAL HIGH (ref 4.6–6.5)

## 2022-01-01 LAB — URINALYSIS, ROUTINE W REFLEX MICROSCOPIC
Bilirubin Urine: NEGATIVE
Hgb urine dipstick: NEGATIVE
Ketones, ur: NEGATIVE
Leukocytes,Ua: NEGATIVE
Nitrite: NEGATIVE
RBC / HPF: NONE SEEN (ref 0–?)
Specific Gravity, Urine: <=1.005 — AB (ref 1.000–1.030)
Total Protein, Urine: NEGATIVE
Urine Glucose: >=1000 — AB
Urobilinogen, UA: 0.2 (ref 0.0–1.0)
pH: 5.5 (ref 5.0–8.0)

## 2022-01-01 LAB — CBC WITH DIFFERENTIAL/PLATELET
Basophils Absolute: 0.1 10*3/uL (ref 0.0–0.1)
Basophils Relative: 1.3 % (ref 0.0–3.0)
Eosinophils Absolute: 0.7 10*3/uL (ref 0.0–0.7)
Eosinophils Relative: 11.9 % — ABNORMAL HIGH (ref 0.0–5.0)
HCT: 45.9 % (ref 39.0–52.0)
Hemoglobin: 14.7 g/dL (ref 13.0–17.0)
Lymphocytes Relative: 20.7 % (ref 12.0–46.0)
Lymphs Abs: 1.3 10*3/uL (ref 0.7–4.0)
MCHC: 32.1 g/dL (ref 30.0–36.0)
MCV: 87 fl (ref 78.0–100.0)
Monocytes Absolute: 0.7 10*3/uL (ref 0.1–1.0)
Monocytes Relative: 11.3 % (ref 3.0–12.0)
Neutro Abs: 3.4 10*3/uL (ref 1.4–7.7)
Neutrophils Relative %: 54.8 % (ref 43.0–77.0)
Platelets: 146 10*3/uL — ABNORMAL LOW (ref 150.0–400.0)
RBC: 5.27 Mil/uL (ref 4.22–5.81)
RDW: 14.9 % (ref 11.5–15.5)
WBC: 6.1 10*3/uL (ref 4.0–10.5)

## 2022-01-01 LAB — COMPREHENSIVE METABOLIC PANEL
ALT: 20 U/L (ref 0–53)
AST: 16 U/L (ref 0–37)
Albumin: 4.3 g/dL (ref 3.5–5.2)
Alkaline Phosphatase: 67 U/L (ref 39–117)
BUN: 40 mg/dL — ABNORMAL HIGH (ref 6–23)
CO2: 27 mEq/L (ref 19–32)
Calcium: 9.2 mg/dL (ref 8.4–10.5)
Chloride: 98 mEq/L (ref 96–112)
Creatinine, Ser: 1.69 mg/dL — ABNORMAL HIGH (ref 0.40–1.50)
GFR: 37.44 mL/min — ABNORMAL LOW (ref >60.00)
Glucose, Bld: 121 mg/dL — ABNORMAL HIGH (ref 70–99)
Potassium: 4.7 mEq/L (ref 3.5–5.1)
Sodium: 134 mEq/L — ABNORMAL LOW (ref 135–145)
Total Bilirubin: 0.7 mg/dL (ref 0.2–1.2)
Total Protein: 6.6 g/dL (ref 6.0–8.3)

## 2022-01-01 LAB — LIPID PANEL
Cholesterol: 127 mg/dL (ref 0–200)
HDL: 30.8 mg/dL — ABNORMAL LOW (ref >39.00)
LDL Cholesterol: 70 mg/dL (ref 0–99)
NonHDL: 95.86
Total CHOL/HDL Ratio: 4
Triglycerides: 131 mg/dL (ref 0.0–149.0)
VLDL: 26.2 mg/dL (ref 0.0–40.0)

## 2022-01-01 LAB — VITAMIN B12: Vitamin B-12: 590 pg/mL (ref 211–911)

## 2022-01-01 LAB — MICROALBUMIN / CREATININE URINE RATIO
Creatinine,U: 31.5 mg/dL
Microalb Creat Ratio: 2.2 mg/g (ref 0.0–30.0)
Microalb, Ur: <0.7 mg/dL (ref 0.0–1.9)

## 2022-01-01 MED ORDER — CEPHALEXIN 500 MG PO CAPS: 500.0000 mg | ORAL_CAPSULE | Freq: Three times a day (TID) | ORAL | 0 refills | Status: AC

## 2022-01-01 NOTE — Progress Notes (Signed)
Established Patient Office Visit  Subjective   Patient ID: [name] Little, male    DOB: [birth year]  Age: 81 y.o. MRN: [id number]  Chief Complaint  Patient presents with   Follow-up    6 month follow up, check swollen gland on right side of neck.     HPI for follow-up of hypertension, elevated cholesterol, type 2 diabetes and B12 deficiency.  History of sialoadenitis.  Plan seems to be bothering him again.  Blood pressure has been well controlled with amlodipine 2.5, enalapril 10 mg furosemide 20 and metoprolol 50.  History of elevated cholesterol with history of coronary artery disease, carotid artery disease as well as peripheral vascular disease.  He continues atorvastatin 40 mg.  Continues empagliflozin 10 mg daily for diabetes and renal protection.  Continues with 1000 mcg of B12 daily    Review of Systems  Constitutional: Negative.   HENT: Negative.    Eyes:  Negative for blurred vision, discharge and redness.  Respiratory: Negative.    Cardiovascular: Negative.   Gastrointestinal:  Negative for abdominal pain.  Genitourinary: Negative.   Musculoskeletal: Negative.  Negative for myalgias.  Skin:  Negative for rash.  Neurological:  Negative for tingling, loss of consciousness and weakness.  Endo/Heme/Allergies:  Negative for polydipsia.      Objective:     BP 110/68 (BP Location: Right Arm, Patient Position: Sitting, Cuff Size: Normal)   Pulse (!) 55   Temp (!) 97.2 F (36.2 C) (Temporal)   Ht '5\' 10"'$  (1.778 m)   Wt 185 lb (83.9 kg)   SpO2 99%   BMI 26.54 kg/m    Physical Exam Constitutional:      General: He is not in acute distress.    Appearance: Normal appearance. He is not ill-appearing, toxic-appearing or diaphoretic.  HENT:     Head: Normocephalic and atraumatic.     Right Ear: External ear normal.     Left Ear: External ear normal.     Mouth/Throat:     Mouth: Mucous membranes are moist.     Pharynx: Oropharynx is clear. No oropharyngeal  exudate or posterior oropharyngeal erythema.  Eyes:     General: No scleral icterus.       Right eye: No discharge.        Left eye: No discharge.     Extraocular Movements: Extraocular movements intact.     Conjunctiva/sclera: Conjunctivae normal.     Pupils: Pupils are equal, round, and reactive to light.  Neck:   Cardiovascular:     Rate and Rhythm: Normal rate and regular rhythm.  Pulmonary:     Effort: Pulmonary effort is normal. No respiratory distress.     Breath sounds: Normal breath sounds.  Abdominal:     General: Bowel sounds are normal.     Tenderness: There is no abdominal tenderness. There is no guarding.  Musculoskeletal:     Cervical back: No rigidity or tenderness.  Skin:    General: Skin is warm and dry.  Neurological:     Mental Status: He is alert and oriented to person, place, and time.  Psychiatric:        Mood and Affect: Mood normal.        Behavior: Behavior normal.      No results found for any visits on 01/01/22.    The ASCVD Risk score (Arnett DK, et al., 2019) failed to calculate for the following reasons:   The 2019 ASCVD risk score is only valid for  ages 48 to 80    Assessment & Plan:   Problem List Items Addressed This Visit       Cardiovascular and Mediastinum   Essential hypertension   Relevant Orders   CBC with Differential/Platelet   Urinalysis, Routine w reflex microscopic   Microalbumin / creatinine urine ratio     Digestive   Sialadenitis   Relevant Medications   cephALEXin (KEFLEX) 500 MG capsule     Endocrine   Type 2 diabetes mellitus with diabetic neuropathy, without long-term current use of insulin ([facility])   Relevant Medications   JARDIANCE 10 MG TABS tablet   Other Relevant Orders   Hemoglobin A1c   Comprehensive metabolic panel   Microalbumin / creatinine urine ratio     Genitourinary   Stage 3b chronic kidney disease ([facility]) - Primary   Relevant Orders   Comprehensive metabolic panel   Microalbumin /  creatinine urine ratio     Other   Hyperlipidemia   Relevant Orders   Lipid panel   Comprehensive metabolic panel   Q41 deficiency   Relevant Orders   Vitamin B12    Return in about 6 months (around 07/02/2022), or if symptoms worsen or fail to improve.  Continue all medicines as above.  Adjustments made pending results of today's lab work.  Advised him to have the RSV vaccine through his pharmacy.  Keflex 500 mg 3 times daily for inflamed salivary gland.  Libby Maw, MD

## 2022-01-02 ENCOUNTER — Other Ambulatory Visit: Payer: Self-pay | Admitting: Family Medicine

## 2022-01-03 ENCOUNTER — Ambulatory Visit: Payer: Medicare HMO | Admitting: Family Medicine

## 2022-01-03 NOTE — Progress Notes (Signed)
Cardiology Office Note   Date:  01/04/2022   ID:  CLYDE, ZARRELLA [id number], MRN [id number]  PCP:  Libby Maw, MD  Cardiologist:   [name], MD   Chief Complaint  Patient presents with   Cardiomyopathy   Coronary Artery Disease       History of Present Illness: [name] Little is a 81 y.o. male who presents for one year follow up of CAD.  Since I last saw him he has done well.  Since I last saw him he saw Dr. Gwenlyn Found for claudication.  Right ABI was  0.78 with a high-frequency signal in his right common iliac artery.  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 He had orbital atherectomy followed by VBX covered stenting with excellent result.   This was late 2021.   He returns for follow up.  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  He is having fun with this.  He still does a little yard work.  He is limited by his back pain. The patient denies any new symptoms such as chest discomfort, neck or arm discomfort. There has been no new shortness of breath, PND or orthopnea. There have been no reported palpitations, presyncope or syncope.    Past Medical History:  Diagnosis Date   Allergic rhinitis    Anxiety and depression    Arthritis    CAD (coronary artery disease)    Diabetes mellitus    GERD (gastroesophageal reflux disease)    Headache(784.0) 06/2003   s/p neuro eval ? migraine   Hyperlipidemia    Hyperplastic colon polyp 2008   Hypertension    Iron deficiency    h/o   Ischemic cardiomyopathy    EF had 30-35%. The most recent EF was 50% on echo in september 2009   PUD (peptic ulcer disease) 7/11   EGD showed few small ulcers, esophagues strecthed   Vision abnormalities     Past Surgical History:  Procedure Laterality Date   ABDOMINAL AORTAGRAM  02/03/2020   ABDOMINAL AORTOGRAM W/LOWER EXTREMITY N/A 02/03/2020   Procedure:  ABDOMINAL AORTOGRAM W/LOWER EXTREMITY;  Surgeon: Lorretta Harp, MD;  Location: [facility];  Service: Cardiovascular;  Laterality: N/A;   CATARACT EXTRACTION Right 07-2012   CORONARY ARTERY BYPASS GRAFT  2004   LIMA to the LADs, sequentail SVG to intermediated and obtuse marginal, sequential SVG to PDA and posterolaterla   INGUINAL HERNIA REPAIR Bilateral    PERIPHERAL VASCULAR INTERVENTION Right 02/03/2020   Procedure: PERIPHERAL VASCULAR INTERVENTION;  Surgeon: Lorretta Harp, MD;  Location: [facility];  Service: Cardiovascular;  Laterality: Right;   TONSILLECTOMY       Current Outpatient Medications  Medication Sig Dispense Refill   acetaminophen (TYLENOL) 500 MG tablet Take 1,000 mg by mouth every 6 (six) hours as needed for mild pain, moderate pain or headache (Allergies).      amLODipine (NORVASC) 2.5 MG tablet TAKE ONE TABLET BY MOUTH ONE TIME DAILY 90 tablet 3   aspirin EC 81 MG EC tablet Take 1 tablet (81 mg total) by mouth daily. Swallow whole. 90 tablet 3   atorvastatin (LIPITOR) 40 MG tablet TAKE ONE TABLET BY MOUTH AT BEDTIME 90 tablet 0   Azelastine HCl 137 MCG/SPRAY SOLN SPRAY TWO SPRAYS IN EACH NOSTRIL AT BEDTIME AS NEEDED FOR RHINITIS OR  ALLERGIES 30 mL 12   clopidogrel (PLAVIX) 75 MG tablet TAKE ONE TABLET BY MOUTH ONE TIME DAILY WITH BREAKFAST 90 tablet 0   Coenzyme Q10 (COQ10) 400 MG CAPS Take 400 mg by mouth daily.      diclofenac Sodium (VOLTAREN) 1 % GEL Apply a small grape sized dollop to left ankle 3 times daily. 150 g 1   enalapril (VASOTEC) 10 MG tablet TAKE ONE TABLET BY MOUTH TWICE A DAY 360 tablet 0   fish oil-omega-3 fatty acids 1000 MG capsule Take 1 g by mouth daily.     Flaxseed, Linseed, (FLAX SEED OIL PO) Take 1,300 mg by mouth daily at 12 noon. Omega 3     fluticasone (FLONASE) 50 MCG/ACT nasal spray Place 1 spray into both nostrils daily.   6   furosemide (LASIX) 20 MG tablet TAKE ONE TABLET BY MOUTH ONE TIME DAILY 90 tablet 1    JARDIANCE 10 MG TABS tablet TAKE ONE TABLET BY MOUTH ONE TIME DAILY BEFORE BREAKFAST 30 tablet 5   metoprolol tartrate (LOPRESSOR) 50 MG tablet TAKE ONE TABLET BY MOUTH TWICE A DAY 180 tablet 0   pantoprazole (PROTONIX) 40 MG tablet TAKE ONE TABLET BY MOUTH ONE TIME DAILY 90 tablet 0   Vitamin D3 (VITAMIN D) 25 MCG tablet Take 1,000 Units by mouth daily.     cephALEXin (KEFLEX) 500 MG capsule Take 1 capsule (500 mg total) by mouth 3 (three) times daily for 10 days. (Patient not taking: Reported on 01/04/2022) 30 capsule 0   Current Facility-Administered Medications  Medication Dose Route Frequency Provider Last Rate Last Admin   sodium chloride flush (NS) 0.9 % injection 3 mL  3 mL Intravenous Q12H Lorretta Harp, MD        Allergies:   Micardis [telmisartan]    ROS:  Please see the history of present illness.   Otherwise, review of systems are positive for none.   All other systems are reviewed and negative.    PHYSICAL EXAM: VS:  BP 110/60   Pulse 64   Ht '5\' 10"'$  (1.778 m)   Wt 185 lb (83.9 kg)   SpO2 97%   BMI 26.54 kg/m  , BMI Body mass index is 26.54 kg/m. GENERAL:  Well appearing NECK:  No jugular venous distention, waveform within normal limits, carotid upstroke brisk and symmetric, no bruits, no thyromegaly LUNGS:  Clear to auscultation bilaterally CHEST:  Well healed sternotomy scar. HEART:  PMI not displaced or sustained,S1 and S2 within normal limits, no S3, no S4, no clicks, no rubs, no murmurs ABD:  Flat, positive bowel sounds normal in frequency in pitch, no bruits, no rebound, no guarding, no midline pulsatile mass, no hepatomegaly, no splenomegaly EXT:  2 plus pulses throughout, no edema, no cyanosis no clubbing   EKG:  EKG is  ordered today. The ekg ordered today demonstrates sinus rhythm, rate 64, axis within normal limits, old and inferior infarct probably posterior as well, lateral Q waves, premature ventricular contractions.  No change from  previous     Recent Labs: 01/01/2022: ALT 20; BUN 40; Creatinine, Ser 1.69; Hemoglobin 14.7; Platelets 146.0; Potassium 4.7; Sodium 134    Lipid Panel    Component Value Date/Time   CHOL 127 01/01/2022 1006   TRIG 131.0 01/01/2022 1006   HDL 30.80 (L) 01/01/2022 1006   CHOLHDL 4 01/01/2022 1006   VLDL 26.2 01/01/2022 1006   LDLCALC 70 01/01/2022 1006   LDLDIRECT 72.0 04/05/2021 1006  Wt Readings from Last 3 Encounters:  01/04/22 185 lb (83.9 kg)  01/01/22 185 lb (83.9 kg)  09/06/21 185 lb (83.9 kg)      Other studies Reviewed: Additional studies/ records that were reviewed today include: Labs. Review of the above records demonstrates:  Please see elsewhere in the note.     ASSESSMENT AND PLAN:  CAD -  The patient has no new sypmtoms.  No further cardiovascular testing is indicated.  We will continue with aggressive risk reduction and meds as listed.  CARDIOMYOPATHY - He had a mildy reduced EF in 2016.  I will follow-up with an echocardiogram as its been several years.  HYPERTENSION -  The blood pressure is controlled.  He can continue the meds as listed.  HYPERLIPIDEMIA -  LDL was 59 with an HDL of 30.  No change in therapy.  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  No further evaluation.   PVD  He had tremendous improvement with his lower extremity intervention and he is followed by Dr. Alvester Chou.   DM - A1c typically hovers between 6.7 and 7.  We discussed the relevance of this.  CKD IIIa - His creatinine was 1.87.  This is unchanged from previous.  Current medicines are reviewed at length with the patient today.  The patient does not have concerns regarding medicines.  The following changes have been made: None  Labs/ tests ordered today include:   Orders Placed This Encounter  Procedures   EKG 12-Lead   ECHOCARDIOGRAM COMPLETE     Disposition:   FU with me in 12 months.     Signed, [name], MD  01/04/2022 2:19 PM    [facility]

## 2022-01-03 NOTE — Telephone Encounter (Signed)
error 

## 2022-01-04 ENCOUNTER — Encounter: Payer: Self-pay | Admitting: Cardiology

## 2022-01-04 ENCOUNTER — Ambulatory Visit: Payer: Medicare HMO | Attending: Cardiology | Admitting: Cardiology

## 2022-01-04 VITALS — BP 110/60 | HR 64 | Ht 70.0 in | Wt 185.0 lb

## 2022-01-04 DIAGNOSIS — I6523 Occlusion and stenosis of bilateral carotid arteries: Secondary | ICD-10-CM

## 2022-01-04 DIAGNOSIS — E785 Hyperlipidemia, unspecified: Secondary | ICD-10-CM

## 2022-01-04 DIAGNOSIS — I10 Essential (primary) hypertension: Secondary | ICD-10-CM

## 2022-01-04 DIAGNOSIS — I420 Dilated cardiomyopathy: Secondary | ICD-10-CM

## 2022-01-04 DIAGNOSIS — I2510 Atherosclerotic heart disease of native coronary artery without angina pectoris: Secondary | ICD-10-CM | POA: Diagnosis not present

## 2022-01-04 NOTE — Patient Instructions (Signed)
Medication Instructions:  No changes *If you need a refill on your cardiac medications before your next appointment, please call your pharmacy*   Lab Work: None ordered If you have labs (blood work) drawn today and your tests are completely normal, you will receive your results only by: [place] (if you have MyChart) OR A paper copy in the mail If you have any lab test that is abnormal or we need to change your treatment, we will call you to review the results.   Testing/Procedures: Your physician has requested that you have an echocardiogram. Echocardiography is a painless test that uses sound waves to create images of your heart. It provides your doctor with information about the size and shape of your heart and how well your heart's chambers and valves are working. You may receive an ultrasound enhancing agent through an IV if needed to better visualize your heart during the echo.This procedure takes approximately one hour. There are no restrictions for this procedure. This will take place at the [address].     Follow-Up: At [facility], you and your health needs are our priority.  As part of our continuing mission to provide you with exceptional heart care, we have created designated Provider Care Teams.  These Care Teams include your primary Cardiologist (physician) and Advanced Practice Providers (APPs -  Physician Assistants and Nurse Practitioners) who all work together to provide you with the care you need, when you need it.  We recommend signing up for the patient portal called "MyChart".  Sign up information is provided on this After Visit Summary.  MyChart is used to connect with patients for Virtual Visits (Telemedicine).  Patients are able to view lab/test results, encounter notes, upcoming appointments, etc.  Non-urgent messages can be sent to your provider as well.   To learn more about what you can do with MyChart, go to  [email or web address].    Your next appointment:   12 month(s)  The format for your next appointment:   In Person  Provider:   [name], MD

## 2022-01-28 ENCOUNTER — Ambulatory Visit (HOSPITAL_COMMUNITY): Payer: Medicare HMO | Attending: Cardiology

## 2022-01-28 DIAGNOSIS — I420 Dilated cardiomyopathy: Secondary | ICD-10-CM | POA: Diagnosis not present

## 2022-01-28 LAB — ECHOCARDIOGRAM COMPLETE
Area-P 1/2: 4.1 cm2
S' Lateral: 4.1 cm

## 2022-02-14 ENCOUNTER — Other Ambulatory Visit: Payer: Self-pay | Admitting: Family Medicine

## 2022-02-14 DIAGNOSIS — I10 Essential (primary) hypertension: Secondary | ICD-10-CM

## 2022-02-14 DIAGNOSIS — K219 Gastro-esophageal reflux disease without esophagitis: Secondary | ICD-10-CM

## 2022-02-15 ENCOUNTER — Encounter: Payer: Self-pay | Admitting: Family Medicine

## 2022-02-16 ENCOUNTER — Other Ambulatory Visit: Payer: Self-pay | Admitting: Family Medicine

## 2022-02-16 DIAGNOSIS — I2510 Atherosclerotic heart disease of native coronary artery without angina pectoris: Secondary | ICD-10-CM

## 2022-03-26 ENCOUNTER — Other Ambulatory Visit: Payer: Self-pay | Admitting: Family Medicine

## 2022-04-04 ENCOUNTER — Telehealth: Payer: Self-pay | Admitting: Family Medicine

## 2022-04-04 NOTE — Telephone Encounter (Signed)
Left message for patient to call back and schedule Medicare Annual Wellness Visit (AWV) either virtually or in office. Left  my Herbie Drape number [phone]   Last AWV 11/02/14 please schedule with Nurse Health Adviser   45 min for awv-i  in office appointments 30 min for awv-s & awv-i phone/virtual appointments

## 2022-04-16 ENCOUNTER — Other Ambulatory Visit: Payer: Self-pay | Admitting: Family Medicine

## 2022-05-13 ENCOUNTER — Other Ambulatory Visit: Payer: Self-pay | Admitting: Family Medicine

## 2022-05-13 DIAGNOSIS — I10 Essential (primary) hypertension: Secondary | ICD-10-CM

## 2022-05-13 DIAGNOSIS — K219 Gastro-esophageal reflux disease without esophagitis: Secondary | ICD-10-CM

## 2022-05-17 ENCOUNTER — Other Ambulatory Visit: Payer: Self-pay | Admitting: Family Medicine

## 2022-05-17 DIAGNOSIS — I2510 Atherosclerotic heart disease of native coronary artery without angina pectoris: Secondary | ICD-10-CM

## 2022-06-12 ENCOUNTER — Telehealth: Payer: Self-pay | Admitting: Family Medicine

## 2022-06-12 NOTE — Telephone Encounter (Signed)
Called patient to schedule Medicare Annual Wellness Visit (AWV). Left message for patient to call back and schedule Medicare Annual Wellness Visit (AWV).  Last date of AWV: 11/02/14  Please schedule an appointment at any time with [facility].  If any questions, please contact me at [phone].  Thank you ,  Rudell Cobb AWV direct phone # [phone]

## 2022-06-15 ENCOUNTER — Other Ambulatory Visit: Payer: Self-pay | Admitting: Family Medicine

## 2022-06-25 ENCOUNTER — Other Ambulatory Visit: Payer: Self-pay | Admitting: Family Medicine

## 2022-07-01 ENCOUNTER — Ambulatory Visit (INDEPENDENT_AMBULATORY_CARE_PROVIDER_SITE_OTHER): Payer: Medicare HMO | Admitting: Family Medicine

## 2022-07-01 ENCOUNTER — Encounter: Payer: Self-pay | Admitting: Family Medicine

## 2022-07-01 VITALS — BP 115/60 | HR 47 | Temp 97.8°F | Ht 70.0 in | Wt 179.0 lb

## 2022-07-01 DIAGNOSIS — E1140 Type 2 diabetes mellitus with diabetic neuropathy, unspecified: Secondary | ICD-10-CM

## 2022-07-01 DIAGNOSIS — E538 Deficiency of other specified B group vitamins: Secondary | ICD-10-CM | POA: Diagnosis not present

## 2022-07-01 DIAGNOSIS — E782 Mixed hyperlipidemia: Secondary | ICD-10-CM

## 2022-07-01 DIAGNOSIS — Z7984 Long term (current) use of oral hypoglycemic drugs: Secondary | ICD-10-CM | POA: Diagnosis not present

## 2022-07-01 DIAGNOSIS — I10 Essential (primary) hypertension: Secondary | ICD-10-CM | POA: Diagnosis not present

## 2022-07-01 DIAGNOSIS — N1832 Chronic kidney disease, stage 3b: Secondary | ICD-10-CM

## 2022-07-01 LAB — BASIC METABOLIC PANEL
BUN: 32 mg/dL — ABNORMAL HIGH (ref 6–23)
CO2: 25 mEq/L (ref 19–32)
Calcium: 9.3 mg/dL (ref 8.4–10.5)
Chloride: 100 mEq/L (ref 96–112)
Creatinine, Ser: 1.67 mg/dL — ABNORMAL HIGH (ref 0.40–1.50)
GFR: 37.85 mL/min — ABNORMAL LOW (ref >60.00)
Glucose, Bld: 113 mg/dL — ABNORMAL HIGH (ref 70–99)
Potassium: 4.4 mEq/L (ref 3.5–5.1)
Sodium: 139 mEq/L (ref 135–145)

## 2022-07-01 LAB — HEMOGLOBIN A1C: Hgb A1c MFr Bld: 7 % — ABNORMAL HIGH (ref 4.6–6.5)

## 2022-07-01 LAB — LIPID PANEL
Cholesterol: 123 mg/dL (ref 0–200)
HDL: 29.6 mg/dL — ABNORMAL LOW (ref >39.00)
LDL Cholesterol: 73 mg/dL (ref 0–99)
NonHDL: 93.12
Total CHOL/HDL Ratio: 4
Triglycerides: 99 mg/dL (ref 0.0–149.0)
VLDL: 19.8 mg/dL (ref 0.0–40.0)

## 2022-07-01 LAB — VITAMIN B12: Vitamin B-12: 784 pg/mL (ref 211–911)

## 2022-07-01 NOTE — Progress Notes (Signed)
Established Patient Office Visit   Subjective:  Patient ID: [name] Little, male    DOB: 06-May-1940  Age: 82 y.o. MRN: [id number]  Chief Complaint  Patient presents with   Medical Management of Chronic Issues    6 month follow up, no concerns. Patient fasting.     HPI Encounter Diagnoses  Name Primary?   Stage 3b chronic kidney disease (HCC) Yes   B12 deficiency    Essential hypertension    Mixed hyperlipidemia    Type 2 diabetes mellitus with diabetic neuropathy, without long-term current use of insulin (HCC)    Doing well.  Energy levels are up.  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  For follow-up of above.  Continues all medicines as directed.   Review of Systems  Constitutional: Negative.   HENT: Negative.    Eyes:  Negative for blurred vision, discharge and redness.  Respiratory: Negative.    Cardiovascular: Negative.   Gastrointestinal:  Negative for abdominal pain.  Genitourinary: Negative.   Musculoskeletal: Negative.  Negative for myalgias.  Skin:  Negative for rash.  Neurological:  Negative for tingling, loss of consciousness and weakness.  Endo/Heme/Allergies:  Negative for polydipsia.     Current Outpatient Medications:    acetaminophen (TYLENOL) 500 MG tablet, Take 1,000 mg by mouth every 6 (six) hours as needed for mild pain, moderate pain or headache (Allergies). , Disp: , Rfl:    amLODipine (NORVASC) 2.5 MG tablet, TAKE ONE TABLET BY MOUTH ONE TIME DAILY, Disp: 90 tablet, Rfl: 3   aspirin EC 81 MG EC tablet, Take 1 tablet (81 mg total) by mouth daily. Swallow whole., Disp: 90 tablet, Rfl: 3   atorvastatin (LIPITOR) 40 MG tablet, TAKE ONE TABLET BY MOUTH AT BEDTIME, Disp: 90 tablet, Rfl: 4   Azelastine HCl 137 MCG/SPRAY SOLN, SPRAY TWO SPRAYS IN EACH NOSTRIL AT BEDTIME AS NEEDED FOR RHINITIS OR ALLERGIES, Disp: 30 mL, Rfl: 12   clopidogrel (PLAVIX) 75 MG tablet, TAKE ONE TABLET BY MOUTH ONE TIME DAILY WITH BREAKFAST,  Disp: 90 tablet, Rfl: 0   Coenzyme Q10 (COQ10) 400 MG CAPS, Take 400 mg by mouth daily. , Disp: , Rfl:    diclofenac Sodium (VOLTAREN) 1 % GEL, Apply a small grape sized dollop to left ankle 3 times daily., Disp: 150 g, Rfl: 1   enalapril (VASOTEC) 10 MG tablet, TAKE ONE TABLET BY MOUTH TWICE A DAY, Disp: 180 tablet, Rfl: 0   fish oil-omega-3 fatty acids 1000 MG capsule, Take 1 g by mouth daily., Disp: , Rfl:    Flaxseed, Linseed, (FLAX SEED OIL PO), Take 1,300 mg by mouth daily at 12 noon. Omega 3, Disp: , Rfl:    fluticasone (FLONASE) 50 MCG/ACT nasal spray, Place 1 spray into both nostrils daily. , Disp: , Rfl: 6   furosemide (LASIX) 20 MG tablet, TAKE ONE TABLET BY MOUTH ONE TIME DAILY, Disp: 90 tablet, Rfl: 1   JARDIANCE 10 MG TABS tablet, TAKE ONE TABLET BY MOUTH ONE TIME DAILY BEFORE BREAKFAST, Disp: 30 tablet, Rfl: 5   metoprolol tartrate (LOPRESSOR) 50 MG tablet, TAKE ONE TABLET BY MOUTH TWICE A DAY, Disp: 180 tablet, Rfl: 0   pantoprazole (PROTONIX) 40 MG tablet, TAKE ONE TABLET BY MOUTH ONE TIME DAILY, Disp: 90 tablet, Rfl: 0   Vitamin D3 (VITAMIN D) 25 MCG tablet, Take 1,000 Units by mouth daily., Disp: , Rfl:   Current Facility-Administered Medications:    sodium chloride  flush (NS) 0.9 % injection 3 mL, 3 mL, Intravenous, Q12H, Runell Gess, MD   Objective:     BP 115/60 (BP Location: Right Arm, Patient Position: Sitting, Cuff Size: Normal)   Pulse (!) 47   Temp 97.8 F (36.6 C) (Temporal)   Ht 5\' 10"  (1.778 m)   Wt 179 lb (81.2 kg)   SpO2 98%   BMI 25.68 kg/m    Physical Exam Constitutional:      General: He is not in acute distress.    Appearance: Normal appearance. He is not ill-appearing, toxic-appearing or diaphoretic.  HENT:     Head: Normocephalic and atraumatic.     Right Ear: External ear normal.     Left Ear: External ear normal.  Eyes:     General: No scleral icterus.       Right eye: No discharge.        Left eye: No discharge.     Extraocular  Movements: Extraocular movements intact.     Conjunctiva/sclera: Conjunctivae normal.  Cardiovascular:     Rate and Rhythm: Normal rate and regular rhythm.  Pulmonary:     Effort: Pulmonary effort is normal. No respiratory distress.     Breath sounds: Normal breath sounds.  Skin:    General: Skin is warm and dry.  Neurological:     Mental Status: He is alert and oriented to person, place, and time.  Psychiatric:        Mood and Affect: Mood normal.        Behavior: Behavior normal.      No results found for any visits on 07/01/22.    The ASCVD Risk score (Arnett DK, et al., 2019) failed to calculate for the following reasons:   The 2019 ASCVD risk score is only valid for ages 58 to 82    Assessment & Plan:   Stage 3b chronic kidney disease (HCC) -     Basic metabolic panel  B12 deficiency -     Vitamin B12  Essential hypertension -     Basic metabolic panel  Mixed hyperlipidemia -     Lipid panel  Type 2 diabetes mellitus with diabetic neuropathy, without long-term current use of insulin (HCC) -     Basic metabolic panel -     Hemoglobin A1c    Return in about 6 months (around 12/31/2022), or Continue all medications as above.  Adjustments made pending results of lab work today.Mliss Sax, MD

## 2022-07-29 IMAGING — DX DG HAND COMPLETE 3+V*L*
3 series · 3 of 3 positions shown · non-contrast
Comparison: None.

CLINICAL DATA: Left hand pain and stiffness

EXAM:
LEFT HAND - COMPLETE 3+ VIEW

[hand pa]
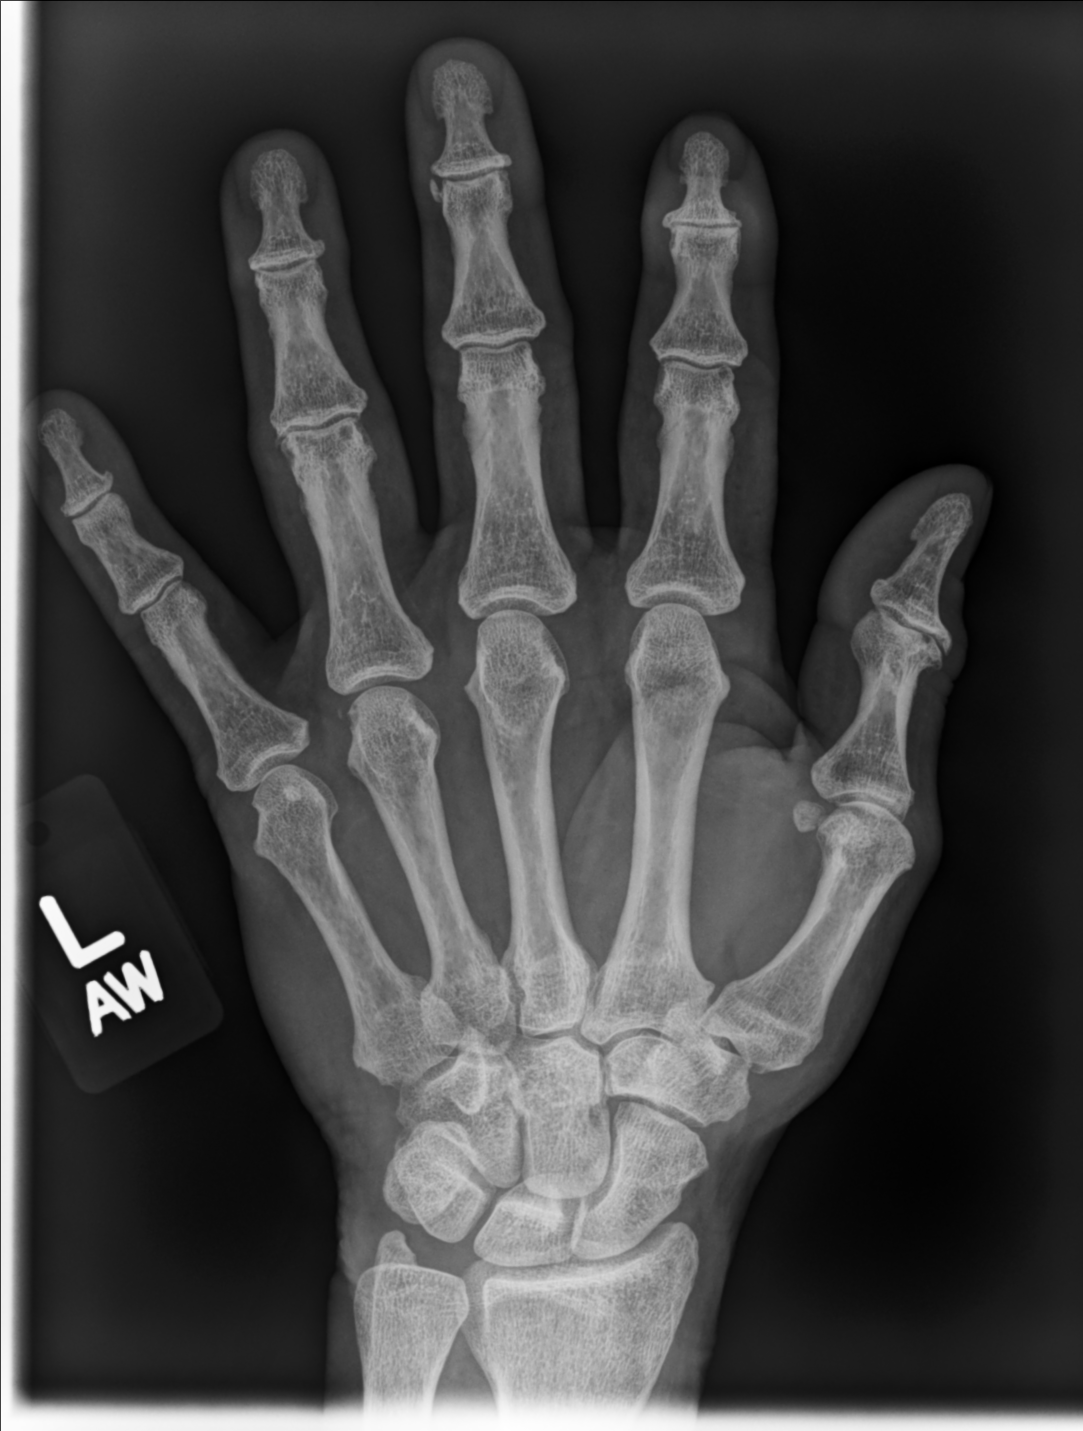

[hand mlo]
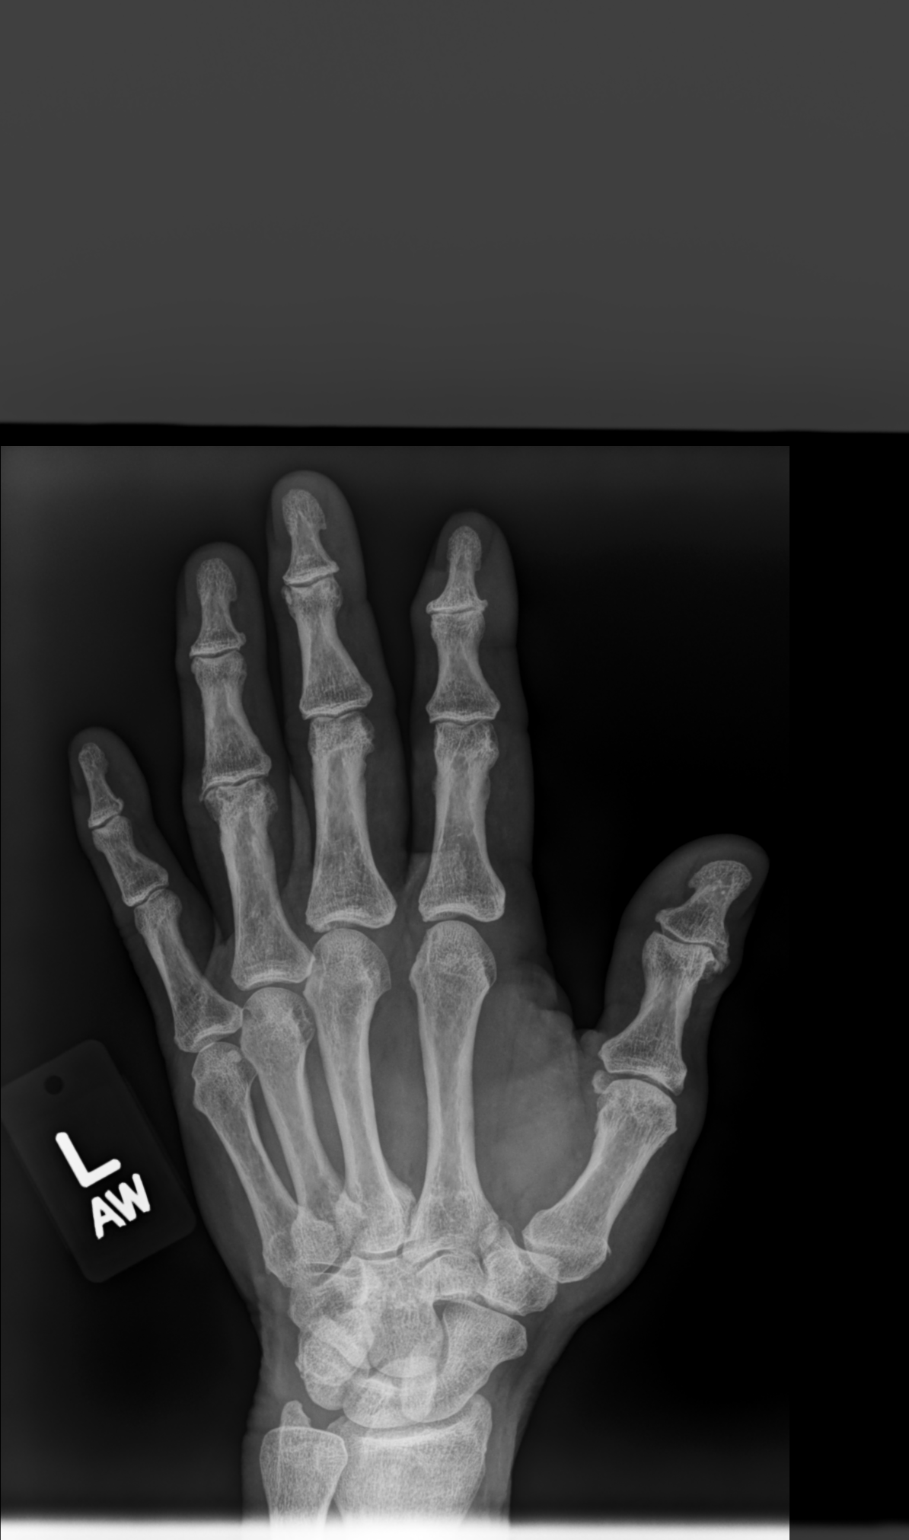

[hand lat]
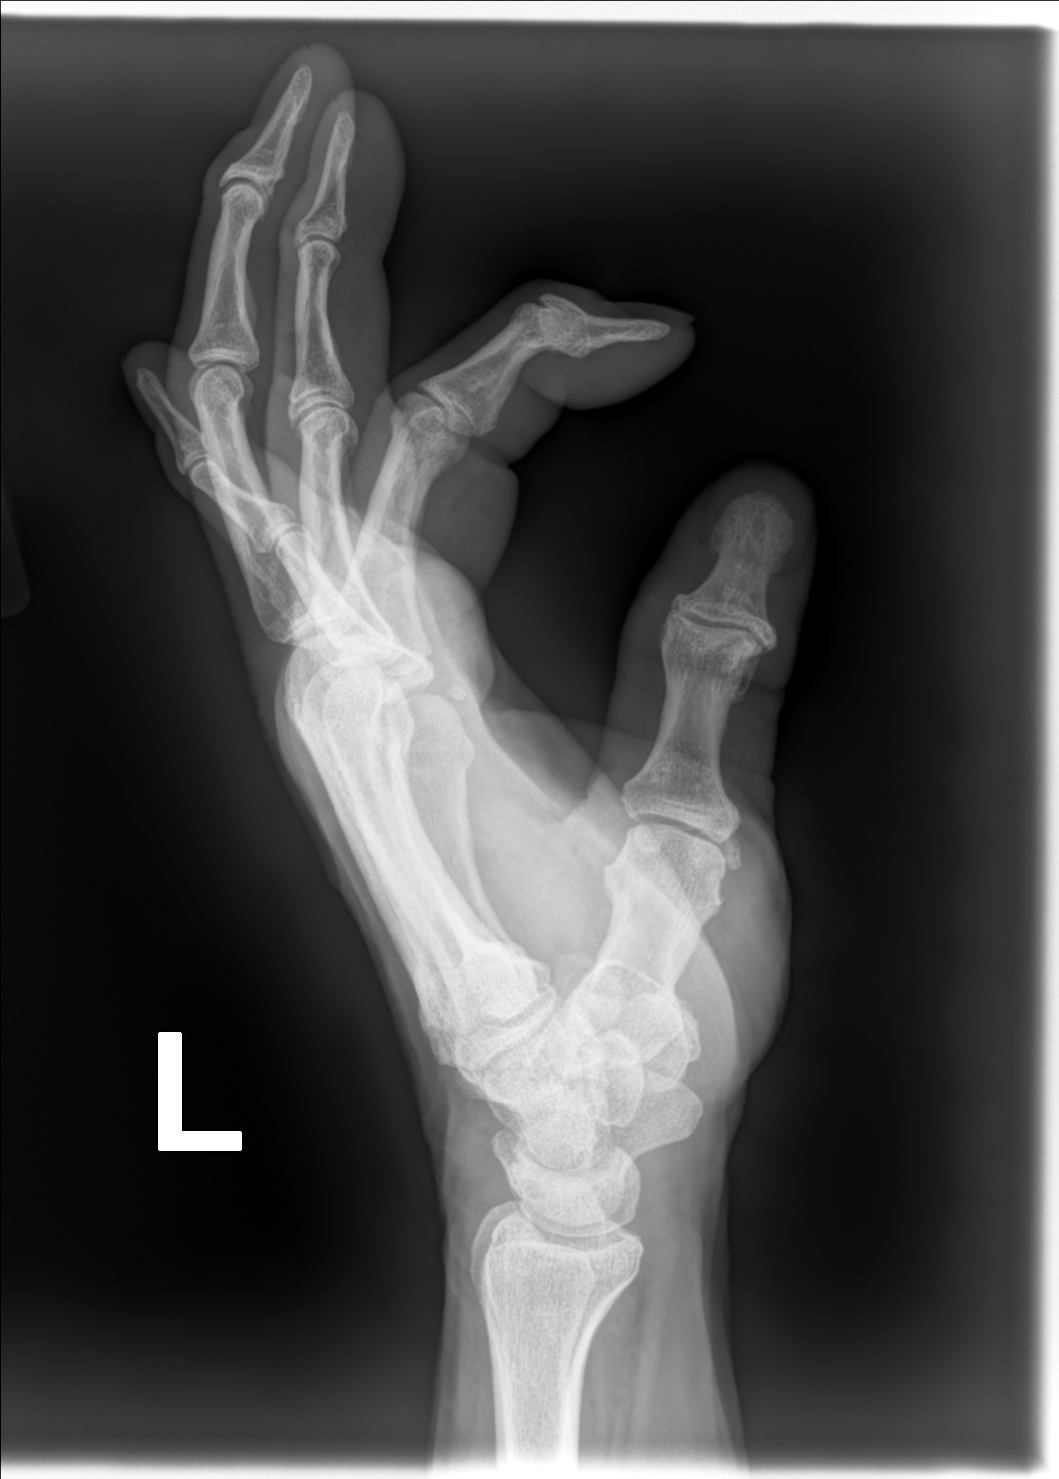

[3 of 3 positions shown; findings below may reference images not displayed]

FINDINGS: Normal alignment. No acute fracture or dislocation. Moderate
degenerative arthritis involving the interphalangeal joint of the
thumb and DIP joints of the second, third, and fourth digits. Mild
degenerative changes noted involving the fifth DIP joint and fourth
PIP joint. Remaining joint spaces are preserved. There is mild
periarticular soft tissue swelling involving the fourth PIP joint.
IMPRESSION: Mild periarticular soft tissue swelling and degenerative arthritis
of the left fourth PIP joint. Superimposed polyarticular
degenerative arthritis as described above.

## 2022-08-08 ENCOUNTER — Other Ambulatory Visit: Payer: Self-pay | Admitting: Family Medicine

## 2022-08-08 DIAGNOSIS — K219 Gastro-esophageal reflux disease without esophagitis: Secondary | ICD-10-CM

## 2022-08-08 DIAGNOSIS — I10 Essential (primary) hypertension: Secondary | ICD-10-CM

## 2022-08-13 ENCOUNTER — Other Ambulatory Visit: Payer: Self-pay | Admitting: Family Medicine

## 2022-08-13 DIAGNOSIS — I2510 Atherosclerotic heart disease of native coronary artery without angina pectoris: Secondary | ICD-10-CM

## 2022-09-11 DIAGNOSIS — Z9849 Cataract extraction status, unspecified eye: Secondary | ICD-10-CM | POA: Diagnosis not present

## 2022-09-11 DIAGNOSIS — E119 Type 2 diabetes mellitus without complications: Secondary | ICD-10-CM | POA: Diagnosis not present

## 2022-09-11 DIAGNOSIS — Z0100 Encounter for examination of eyes and vision without abnormal findings: Secondary | ICD-10-CM | POA: Diagnosis not present

## 2022-09-11 DIAGNOSIS — H52223 Regular astigmatism, bilateral: Secondary | ICD-10-CM | POA: Diagnosis not present

## 2022-09-11 DIAGNOSIS — I10 Essential (primary) hypertension: Secondary | ICD-10-CM | POA: Diagnosis not present

## 2022-09-11 DIAGNOSIS — Z961 Presence of intraocular lens: Secondary | ICD-10-CM | POA: Diagnosis not present

## 2022-09-11 DIAGNOSIS — H5203 Hypermetropia, bilateral: Secondary | ICD-10-CM | POA: Diagnosis not present

## 2022-09-11 DIAGNOSIS — H35033 Hypertensive retinopathy, bilateral: Secondary | ICD-10-CM | POA: Diagnosis not present

## 2022-09-11 DIAGNOSIS — H524 Presbyopia: Secondary | ICD-10-CM | POA: Diagnosis not present

## 2022-09-13 ENCOUNTER — Other Ambulatory Visit: Payer: Self-pay | Admitting: Family Medicine

## 2022-10-02 ENCOUNTER — Telehealth: Payer: Self-pay | Admitting: Cardiology

## 2022-10-02 NOTE — Telephone Encounter (Signed)
Paper Work Dropped Off: Landscape architect for [employer] Parking Placard  Date: 10/02/2022  Location of paper:  Provider Mailbox

## 2022-10-04 NOTE — Telephone Encounter (Signed)
Left message for patient that handicap plaque is signed and at the front desk for pick up. He is to call if he would like it mailed.

## 2022-10-31 ENCOUNTER — Other Ambulatory Visit: Payer: Self-pay | Admitting: Family Medicine

## 2022-11-17 ENCOUNTER — Other Ambulatory Visit: Payer: Self-pay | Admitting: Family Medicine

## 2022-11-17 DIAGNOSIS — I2510 Atherosclerotic heart disease of native coronary artery without angina pectoris: Secondary | ICD-10-CM

## 2022-12-06 ENCOUNTER — Encounter: Payer: Self-pay | Admitting: Family Medicine

## 2022-12-21 ENCOUNTER — Other Ambulatory Visit: Payer: Self-pay | Admitting: Family Medicine

## 2022-12-24 ENCOUNTER — Other Ambulatory Visit: Payer: Self-pay

## 2022-12-24 MED ORDER — ENALAPRIL MALEATE 10 MG PO TABS: 10.0000 mg | ORAL_TABLET | Freq: Two times a day (BID) | ORAL | 0 refills | Status: DC

## 2022-12-31 ENCOUNTER — Ambulatory Visit (INDEPENDENT_AMBULATORY_CARE_PROVIDER_SITE_OTHER): Payer: Medicare HMO | Admitting: Family Medicine

## 2022-12-31 ENCOUNTER — Encounter: Payer: Self-pay | Admitting: Family Medicine

## 2022-12-31 VITALS — BP 118/64 | HR 53 | Temp 97.4°F | Ht 70.0 in | Wt 183.0 lb

## 2022-12-31 DIAGNOSIS — E559 Vitamin D deficiency, unspecified: Secondary | ICD-10-CM | POA: Diagnosis not present

## 2022-12-31 DIAGNOSIS — E782 Mixed hyperlipidemia: Secondary | ICD-10-CM | POA: Diagnosis not present

## 2022-12-31 DIAGNOSIS — E1140 Type 2 diabetes mellitus with diabetic neuropathy, unspecified: Secondary | ICD-10-CM | POA: Diagnosis not present

## 2022-12-31 DIAGNOSIS — I10 Essential (primary) hypertension: Secondary | ICD-10-CM

## 2022-12-31 DIAGNOSIS — N1832 Chronic kidney disease, stage 3b: Secondary | ICD-10-CM | POA: Diagnosis not present

## 2022-12-31 LAB — URINALYSIS, ROUTINE W REFLEX MICROSCOPIC
Bilirubin Urine: NEGATIVE
Hgb urine dipstick: NEGATIVE
Ketones, ur: NEGATIVE
Leukocytes,Ua: NEGATIVE
Nitrite: NEGATIVE
Specific Gravity, Urine: 1.01 (ref 1.000–1.030)
Total Protein, Urine: NEGATIVE
Urine Glucose: >=1000 — AB
Urobilinogen, UA: 0.2 (ref 0.0–1.0)
pH: 6 (ref 5.0–8.0)

## 2022-12-31 LAB — MICROALBUMIN / CREATININE URINE RATIO
Creatinine,U: 47.3 mg/dL
Microalb Creat Ratio: 1.8 mg/g (ref 0.0–30.0)
Microalb, Ur: 0.8 mg/dL (ref 0.0–1.9)

## 2022-12-31 LAB — BASIC METABOLIC PANEL
BUN: 36 mg/dL — ABNORMAL HIGH (ref 6–23)
CO2: 30 meq/L (ref 19–32)
Calcium: 9 mg/dL (ref 8.4–10.5)
Chloride: 97 meq/L (ref 96–112)
Creatinine, Ser: 1.9 mg/dL — ABNORMAL HIGH (ref 0.40–1.50)
GFR: 32.31 mL/min — ABNORMAL LOW (ref >60.00)
Glucose, Bld: 119 mg/dL — ABNORMAL HIGH (ref 70–99)
Potassium: 4.4 meq/L (ref 3.5–5.1)
Sodium: 137 meq/L (ref 135–145)

## 2022-12-31 LAB — CBC
HCT: 46.9 % (ref 39.0–52.0)
Hemoglobin: 14.5 g/dL (ref 13.0–17.0)
MCHC: 30.9 g/dL (ref 30.0–36.0)
MCV: 85.3 fL (ref 78.0–100.0)
Platelets: 168 10*3/uL (ref 150.0–400.0)
RBC: 5.5 Mil/uL (ref 4.22–5.81)
RDW: 16.1 % — ABNORMAL HIGH (ref 11.5–15.5)
WBC: 7.3 10*3/uL (ref 4.0–10.5)

## 2022-12-31 LAB — HEMOGLOBIN A1C: Hgb A1c MFr Bld: 7.1 % — ABNORMAL HIGH (ref 4.6–6.5)

## 2022-12-31 LAB — LDL CHOLESTEROL, DIRECT: Direct LDL: 75 mg/dL

## 2022-12-31 LAB — VITAMIN D 25 HYDROXY (VIT D DEFICIENCY, FRACTURES): VITD: 52.48 ng/mL (ref 30.00–100.00)

## 2022-12-31 NOTE — Progress Notes (Signed)
Established Patient Office Visit   Subjective:  Patient ID: [name] Little, male    DOB: 29-Mar-1940  Age: 82 y.o. MRN: [id number]  Chief Complaint  Patient presents with   Medical Management of Chronic Issues    6 month follow up. Pt is fasting. No concerns. Doing very well per pt.     HPI Encounter Diagnoses  Name Primary?   Essential hypertension Yes   Stage 3b chronic kidney disease (HCC)    Type 2 diabetes mellitus with diabetic neuropathy, without long-term current use of insulin (HCC)    Mixed hyperlipidemia    Vitamin D deficiency    Doing okay.  Staying active by working in the yard and around the house.  No problems with medications other than the expensive Jardiance.  Has been seeing black specks in his urine.  Sometimes sees visions of people and things when his eyes are closed.   Review of Systems  Constitutional: Negative.   HENT: Negative.    Eyes:  Negative for blurred vision, discharge and redness.  Respiratory: Negative.    Cardiovascular: Negative.   Gastrointestinal:  Negative for abdominal pain.  Genitourinary: Negative.   Musculoskeletal: Negative.  Negative for myalgias.  Skin:  Negative for rash.  Neurological:  Negative for tingling, loss of consciousness and weakness.  Endo/Heme/Allergies:  Negative for polydipsia.  Psychiatric/Behavioral:  Negative for depression, memory loss, substance abuse and suicidal ideas. The patient is not nervous/anxious.      Current Outpatient Medications:    acetaminophen (TYLENOL) 500 MG tablet, Take 1,000 mg by mouth every 6 (six) hours as needed for mild pain, moderate pain or headache (Allergies). , Disp: , Rfl:    amLODipine (NORVASC) 2.5 MG tablet, TAKE ONE TABLET BY MOUTH ONE TIME DAILY, Disp: 90 tablet, Rfl: 3   aspirin EC 81 MG EC tablet, Take 1 tablet (81 mg total) by mouth daily. Swallow whole., Disp: 90 tablet, Rfl: 3   atorvastatin (LIPITOR) 40 MG tablet, TAKE ONE TABLET BY MOUTH AT BEDTIME, Disp:  90 tablet, Rfl: 4   Azelastine HCl 137 MCG/SPRAY SOLN, SPRAY TWO SPRAYS IN EACH NOSTRIL AT BEDTIME AS NEEDED FOR RHINITIS OR ALLERGIES, Disp: 30 mL, Rfl: 12   clopidogrel (PLAVIX) 75 MG tablet, TAKE ONE TABLET BY MOUTH ONE TIME DAILY WITH BREAKFAST, Disp: 90 tablet, Rfl: 1   Coenzyme Q10 (COQ10) 400 MG CAPS, Take 400 mg by mouth daily. , Disp: , Rfl:    diclofenac Sodium (VOLTAREN) 1 % GEL, Apply a small grape sized dollop to left ankle 3 times daily., Disp: 150 g, Rfl: 1   enalapril (VASOTEC) 10 MG tablet, Take 1 tablet (10 mg total) by mouth 2 (two) times daily., Disp: 180 tablet, Rfl: 0   fish oil-omega-3 fatty acids 1000 MG capsule, Take 1 g by mouth daily., Disp: , Rfl:    Flaxseed, Linseed, (FLAX SEED OIL PO), Take 1,300 mg by mouth daily at 12 noon. Omega 3, Disp: , Rfl:    fluticasone (FLONASE) 50 MCG/ACT nasal spray, Place 1 spray into both nostrils daily. , Disp: , Rfl: 6   furosemide (LASIX) 20 MG tablet, TAKE ONE TABLET BY MOUTH ONE TIME DAILY, Disp: 90 tablet, Rfl: 1   JARDIANCE 10 MG TABS tablet, TAKE ONE TABLET BY MOUTH ONE TIME DAILY BEFORE BREAKFAST, Disp: 30 tablet, Rfl: 5   metoprolol tartrate (LOPRESSOR) 50 MG tablet, TAKE ONE TABLET BY MOUTH TWICE A DAY, Disp: 180 tablet, Rfl: 1   pantoprazole (PROTONIX) 40  MG tablet, TAKE ONE TABLET BY MOUTH ONE TIME DAILY, Disp: 90 tablet, Rfl: 1   Vitamin D3 (VITAMIN D) 25 MCG tablet, Take 1,000 Units by mouth daily., Disp: , Rfl:   Current Facility-Administered Medications:    sodium chloride flush (NS) 0.9 % injection 3 mL, 3 mL, Intravenous, Q12H, Runell Gess, MD   Objective:     BP 118/64   Pulse (!) 53   Temp (!) 97.4 F (36.3 C)   Ht 5\' 10"  (1.778 m)   Wt 183 lb (83 kg)   SpO2 97%   BMI 26.26 kg/m    Physical Exam Constitutional:      General: He is not in acute distress.    Appearance: Normal appearance. He is not ill-appearing, toxic-appearing or diaphoretic.  HENT:     Head: Normocephalic and atraumatic.      Right Ear: External ear normal.     Left Ear: External ear normal.  Eyes:     General: No scleral icterus.       Right eye: No discharge.        Left eye: No discharge.     Extraocular Movements: Extraocular movements intact.     Conjunctiva/sclera: Conjunctivae normal.  Cardiovascular:     Rate and Rhythm: Normal rate and regular rhythm.  Pulmonary:     Effort: Pulmonary effort is normal. No respiratory distress.     Breath sounds: No wheezing, rhonchi or rales.  Skin:    General: Skin is warm and dry.  Neurological:     Mental Status: He is alert and oriented to person, place, and time.  Psychiatric:        Mood and Affect: Mood normal.        Behavior: Behavior normal.    Diabetic Foot Exam - Simple   Simple Foot Form Diabetic Foot exam was performed with the following findings: Yes 12/31/2022  8:35 AM  Visual Inspection See comments: Yes Sensation Testing See comments: Yes Pulse Check See comments: Yes Comments 1.  Nails are thick and oncotic.  No lesions or rashes.  2.  Decree sensation of toes.  3.  Pulses are diminished capillary refill less than 2 seconds.      No results found for any visits on 12/31/22.    The ASCVD Risk score (Arnett DK, et al., 2019) failed to calculate for the following reasons:   The 2019 ASCVD risk score is only valid for ages 80 to 31    Assessment & Plan:   Essential hypertension -     Basic metabolic panel -     CBC -     Urinalysis, Routine w reflex microscopic -     Microalbumin / creatinine urine ratio  Stage 3b chronic kidney disease (HCC) -     Basic metabolic panel -     Urinalysis, Routine w reflex microscopic -     Microalbumin / creatinine urine ratio  Type 2 diabetes mellitus with diabetic neuropathy, without long-term current use of insulin (HCC) -     Basic metabolic panel -     Urinalysis, Routine w reflex microscopic -     Microalbumin / creatinine urine ratio -     Hemoglobin A1c  Mixed  hyperlipidemia -     LDL cholesterol, direct  Vitamin D deficiency -     VITAMIN D 25 Hydroxy (Vit-D Deficiency, Fractures)    Return in about 6 months (around 07/01/2023), or if symptoms worsen or fail to improve.  Mliss Sax, MD

## 2023-01-08 DIAGNOSIS — E118 Type 2 diabetes mellitus with unspecified complications: Secondary | ICD-10-CM | POA: Insufficient documentation

## 2023-01-08 NOTE — Progress Notes (Signed)
Cardiology Office Note:   Date:  01/10/2023  ID:  [name] Little, DOB 01-09-1941, MRN [id number] PCP: Mliss Sax, MD  [facility] Providers Cardiologist:  Rollene Rotunda, MD {  History of Present Illness:   [name] Little is a 82 y.o. male who presents for one year follow up of CAD.  Since I last saw him he has done well.   Since I last saw him he saw Dr. Allyson Sabal for claudication.  Right ABI was  0.78 with a high-frequency signal in his right common iliac artery.  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 He had orbital atherectomy followed by VBX covered stenting with excellent result.   This was late 2021.    He returns for follow up.  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 The patient denies any new symptoms such as chest discomfort, neck or arm discomfort. There has been no new shortness of breath, PND or orthopnea. There have been no reported palpitations, presyncope or syncope.   ROS: As stated in the HPI and negative for all other systems.  Studies Reviewed:    EKG:   EKG Interpretation Date/Time:  Friday January 10 2023 08:47:02 EST Ventricular Rate:  48 PR Interval:  216 QRS Duration:  88 QT Interval:  446 QTC Calculation: 398 R Axis:   90  Text Interpretation: Sinus bradycardia with marked sinus arrhythmia with 1st degree A-V block Rightward axis Poor anterior R wave progression. When compared with ECG of 22-Jul-2014 16:40, Vent. rate has decreased BY  35 BPM Confirmed by Rollene Rotunda ([id number]) on 01/10/2023 9:10:01 AM     Risk Assessment/Calculations:              Physical Exam:   VS:  BP 112/60 (BP Location: Left Arm, Patient Position: Sitting, Cuff Size: Normal)   Pulse (!) 55   Ht 5' 8.5" (1.74 m)   Wt 185 lb 9.6 oz (84.2 kg)   SpO2 99%   BMI 27.81 kg/m    Wt Readings from Last 3  Encounters:  01/10/23 185 lb 9.6 oz (84.2 kg)  12/31/22 183 lb (83 kg)  07/01/22 179 lb (81.2 kg)     GEN: Well nourished, well developed in no acute distress NECK: No JVD; No carotid bruits CARDIAC: RRR, no murmurs, rubs, gallops RESPIRATORY:  Clear to auscultation without rales, wheezing or rhonchi  ABDOMEN: Soft, non-tender, non-distended EXTREMITIES:  No edema; No deformity   ASSESSMENT AND PLAN:   CAD -  The patient has no new sypmtoms.  No further cardiovascular testing is indicated.  We will continue with aggressive risk reduction and meds as listed.   CARDIOMYOPATHY - He had a mildy reduced EF in 2023.  He has been on reasonable meds and his blood pressure really has not allowed titration.  No change in therapy.  HYPERTENSION -  The blood pressure is managed in the context of managing his slightly reduced ejection fraction.  HYPERLIPIDEMIA -  LDL was 73.  I will keep him on the same meds.  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  No further imaging.  PVD  He has no new symptoms and we are pursuing continued risk reduction.     DM - A1c is up slightly to 7.1 and have asked him to follow-up with his primary  provider.   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   BRADYCARDIA - He is tolerating bradycardia and has no symptoms related to this.  No change in therapy.      Follow up with me in one year.   Signed, Rollene Rotunda, MD

## 2023-01-10 ENCOUNTER — Ambulatory Visit: Payer: Medicare HMO | Attending: Cardiology | Admitting: Cardiology

## 2023-01-10 ENCOUNTER — Encounter: Payer: Self-pay | Admitting: Cardiology

## 2023-01-10 VITALS — BP 112/60 | HR 55 | Ht 68.5 in | Wt 185.6 lb

## 2023-01-10 DIAGNOSIS — E118 Type 2 diabetes mellitus with unspecified complications: Secondary | ICD-10-CM

## 2023-01-10 DIAGNOSIS — N1831 Chronic kidney disease, stage 3a: Secondary | ICD-10-CM | POA: Diagnosis not present

## 2023-01-10 DIAGNOSIS — I6523 Occlusion and stenosis of bilateral carotid arteries: Secondary | ICD-10-CM

## 2023-01-10 DIAGNOSIS — I420 Dilated cardiomyopathy: Secondary | ICD-10-CM | POA: Diagnosis not present

## 2023-01-10 DIAGNOSIS — E785 Hyperlipidemia, unspecified: Secondary | ICD-10-CM

## 2023-01-10 DIAGNOSIS — I739 Peripheral vascular disease, unspecified: Secondary | ICD-10-CM

## 2023-01-10 DIAGNOSIS — I10 Essential (primary) hypertension: Secondary | ICD-10-CM | POA: Diagnosis not present

## 2023-01-10 NOTE — Patient Instructions (Signed)
Medication Instructions:   STOP ASPIRIN  *If you need a refill on your cardiac medications before your next appointment, please call your pharmacy*   Follow-Up: At [facility], you and your health needs are our priority.  As part of our continuing mission to provide you with exceptional heart care, we have created designated Provider Care Teams.  These Care Teams include your primary Cardiologist (physician) and Advanced Practice Providers (APPs -  Physician Assistants and Nurse Practitioners) who all work together to provide you with the care you need, when you need it.  We recommend signing up for the patient portal called "MyChart".  Sign up information is provided on this After Visit Summary.  MyChart is used to connect with patients for Virtual Visits (Telemedicine).  Patients are able to view lab/test results, encounter notes, upcoming appointments, etc.  Non-urgent messages can be sent to your provider as well.   To learn more about what you can do with MyChart, go to [email or web address].    Your next appointment:   12 month(s)  Provider:   Rollene Rotunda, MD

## 2023-01-29 ENCOUNTER — Other Ambulatory Visit: Payer: Self-pay | Admitting: Family Medicine

## 2023-01-29 DIAGNOSIS — K219 Gastro-esophageal reflux disease without esophagitis: Secondary | ICD-10-CM

## 2023-01-29 DIAGNOSIS — I10 Essential (primary) hypertension: Secondary | ICD-10-CM

## 2023-01-31 ENCOUNTER — Other Ambulatory Visit: Payer: Self-pay | Admitting: Family Medicine

## 2023-02-15 ENCOUNTER — Other Ambulatory Visit: Payer: Self-pay | Admitting: Family Medicine

## 2023-02-15 DIAGNOSIS — I2510 Atherosclerotic heart disease of native coronary artery without angina pectoris: Secondary | ICD-10-CM

## 2023-03-17 ENCOUNTER — Other Ambulatory Visit: Payer: Self-pay | Admitting: Family Medicine

## 2023-05-01 ENCOUNTER — Other Ambulatory Visit: Payer: Self-pay | Admitting: Family Medicine

## 2023-05-05 ENCOUNTER — Other Ambulatory Visit: Payer: Self-pay | Admitting: Family Medicine

## 2023-05-06 ENCOUNTER — Other Ambulatory Visit: Payer: Self-pay | Admitting: Family Medicine

## 2023-05-06 DIAGNOSIS — I10 Essential (primary) hypertension: Secondary | ICD-10-CM

## 2023-06-16 ENCOUNTER — Other Ambulatory Visit: Payer: Self-pay | Admitting: Family Medicine

## 2023-06-21 ENCOUNTER — Other Ambulatory Visit: Payer: Self-pay | Admitting: Family Medicine

## 2023-07-01 ENCOUNTER — Encounter: Payer: Self-pay | Admitting: Family Medicine

## 2023-07-01 ENCOUNTER — Ambulatory Visit (INDEPENDENT_AMBULATORY_CARE_PROVIDER_SITE_OTHER): Payer: Medicare HMO | Admitting: Family Medicine

## 2023-07-01 VITALS — BP 122/78 | HR 62 | Temp 97.6°F | Ht 68.0 in | Wt 186.0 lb

## 2023-07-01 DIAGNOSIS — N1832 Chronic kidney disease, stage 3b: Secondary | ICD-10-CM

## 2023-07-01 DIAGNOSIS — I2510 Atherosclerotic heart disease of native coronary artery without angina pectoris: Secondary | ICD-10-CM

## 2023-07-01 DIAGNOSIS — I6523 Occlusion and stenosis of bilateral carotid arteries: Secondary | ICD-10-CM | POA: Diagnosis not present

## 2023-07-01 DIAGNOSIS — E1140 Type 2 diabetes mellitus with diabetic neuropathy, unspecified: Secondary | ICD-10-CM

## 2023-07-01 DIAGNOSIS — Z7984 Long term (current) use of oral hypoglycemic drugs: Secondary | ICD-10-CM

## 2023-07-01 LAB — COMPREHENSIVE METABOLIC PANEL WITH GFR
ALT: 18 U/L (ref 0–53)
AST: 16 U/L (ref 0–37)
Albumin: 4.2 g/dL (ref 3.5–5.2)
Alkaline Phosphatase: 69 U/L (ref 39–117)
BUN: 30 mg/dL — ABNORMAL HIGH (ref 6–23)
CO2: 32 meq/L (ref 19–32)
Calcium: 9.4 mg/dL (ref 8.4–10.5)
Chloride: 98 meq/L (ref 96–112)
Creatinine, Ser: 1.64 mg/dL — ABNORMAL HIGH (ref 0.40–1.50)
GFR: 38.41 mL/min — ABNORMAL LOW (ref >60.00)
Glucose, Bld: 126 mg/dL — ABNORMAL HIGH (ref 70–99)
Potassium: 4.8 meq/L (ref 3.5–5.1)
Sodium: 136 meq/L (ref 135–145)
Total Bilirubin: 0.8 mg/dL (ref 0.2–1.2)
Total Protein: 6.4 g/dL (ref 6.0–8.3)

## 2023-07-01 LAB — HEMOGLOBIN A1C: Hgb A1c MFr Bld: 7 % — ABNORMAL HIGH (ref 4.6–6.5)

## 2023-07-01 LAB — LIPID PANEL
Cholesterol: 116 mg/dL (ref 0–200)
HDL: 32 mg/dL — ABNORMAL LOW (ref >39.00)
LDL Cholesterol: 65 mg/dL (ref 0–99)
NonHDL: 83.69
Total CHOL/HDL Ratio: 4
Triglycerides: 91 mg/dL (ref 0.0–149.0)
VLDL: 18.2 mg/dL (ref 0.0–40.0)

## 2023-07-01 MED ORDER — CLOPIDOGREL BISULFATE 75 MG PO TABS: ORAL_TABLET | ORAL | 0 refills | Status: DC

## 2023-07-01 NOTE — Progress Notes (Signed)
 Established Patient Office Visit   Subjective:  Patient ID: [name] Little, male    DOB: May 08, 1940  Age: 83 y.o. MRN: [id number]  Chief Complaint  Patient presents with   Medical Management of Chronic Issues    6 month follow up. Pt is fasting. Pt doing well. No questions or concerns.     HPI Encounter Diagnoses  Name Primary?   Stage 3b chronic kidney disease (HCC) Yes   Coronary artery disease involving native coronary artery of native heart without angina pectoris    Type 2 diabetes mellitus with diabetic neuropathy, without long-term current use of insulin (HCC)    Bilateral carotid artery stenosis    For follow-up of above.  Continues all medications as directed.  Hydrating well with  60 ounces of water daily.   Review of Systems  Constitutional: Negative.   HENT: Negative.    Eyes:  Negative for blurred vision, discharge and redness.  Respiratory: Negative.    Cardiovascular: Negative.   Gastrointestinal:  Negative for abdominal pain.  Genitourinary: Negative.   Musculoskeletal: Negative.  Negative for myalgias.  Skin:  Negative for rash.  Neurological:  Negative for tingling, loss of consciousness and weakness.  Endo/Heme/Allergies:  Negative for polydipsia.     Current Outpatient Medications:    acetaminophen  (TYLENOL ) 500 MG tablet, Take 1,000 mg by mouth every 6 (six) hours as needed for mild pain, moderate pain or headache (Allergies). , Disp: , Rfl:    amLODipine  (NORVASC ) 2.5 MG tablet, TAKE ONE TABLET BY MOUTH ONE TIME DAILY, Disp: 90 tablet, Rfl: 3   atorvastatin  (LIPITOR) 40 MG tablet, TAKE ONE TABLET BY MOUTH AT BEDTIME, Disp: 90 tablet, Rfl: 4   Azelastine  HCl 137 MCG/SPRAY SOLN, SPRAY TWO SPRAYS IN EACH NOSTRIL ONE TIME DAILY AT BEDTIME AS NEEDED FOR RUNNY NOSE OR ALLERGIES, Disp: 30 mL, Rfl: 12   Coenzyme Q10 (COQ10) 400 MG CAPS, Take 400 mg by mouth daily. , Disp: , Rfl:    diclofenac  Sodium (VOLTAREN ) 1 % GEL, Apply a small grape sized dollop  to left ankle 3 times daily., Disp: 150 g, Rfl: 1   enalapril  (VASOTEC ) 10 MG tablet, TAKE ONE TABLET BY MOUTH TWICE A DAY, Disp: 180 tablet, Rfl: 0   Flaxseed, Linseed, (FLAX SEED OIL PO), Take 1,300 mg by mouth daily at 12 noon. Omega 3, Disp: , Rfl:    furosemide  (LASIX ) 20 MG tablet, TAKE ONE TABLET BY MOUTH ONE TIME DAILY, Disp: 90 tablet, Rfl: 1   JARDIANCE  10 MG TABS tablet, TAKE ONE TABLET BY MOUTH ONE TIME DAILY BEFORE BREAKFAST, Disp: 30 tablet, Rfl: 5   metoprolol  tartrate (LOPRESSOR ) 50 MG tablet, TAKE ONE TABLET BY MOUTH TWICE A DAY, Disp: 180 tablet, Rfl: 1   pantoprazole  (PROTONIX ) 40 MG tablet, TAKE ONE TABLET BY MOUTH ONE TIME DAILY, Disp: 90 tablet, Rfl: 1   clopidogrel  (PLAVIX ) 75 MG tablet, TAKE ONE TABLET BY MOUTH ONE TIME DAILY WITH BREAKFAST, Disp: 90 tablet, Rfl: 0   fish oil-omega-3 fatty acids 1000 MG capsule, Take 1 g by mouth daily., Disp: , Rfl:    fluticasone  (FLONASE ) 50 MCG/ACT nasal spray, Place 1 spray into both nostrils daily. , Disp: , Rfl: 6   Vitamin D3 (VITAMIN D ) 25 MCG tablet, Take 1,000 Units by mouth daily., Disp: , Rfl:   Current Facility-Administered Medications:    sodium chloride  flush (NS) 0.9 % injection 3 mL, 3 mL, Intravenous, Q12H, Avanell Leigh, MD   Objective:  BP 122/78 (Cuff Size: Normal)   Pulse 62   Temp 97.6 F (36.4 C) (Temporal)   Ht 5\' 8"  (1.727 m)   Wt 186 lb (84.4 kg)   SpO2 98%   BMI 28.28 kg/m    Physical Exam Constitutional:      General: He is not in acute distress.    Appearance: Normal appearance. He is not ill-appearing, toxic-appearing or diaphoretic.  HENT:     Head: Normocephalic and atraumatic.     Right Ear: External ear normal.     Left Ear: External ear normal.  Eyes:     General: No scleral icterus.       Right eye: No discharge.        Left eye: No discharge.     Extraocular Movements: Extraocular movements intact.     Conjunctiva/sclera: Conjunctivae normal.  Cardiovascular:     Rate  and Rhythm: Normal rate and regular rhythm.  Pulmonary:     Effort: Pulmonary effort is normal. No respiratory distress.     Breath sounds: No wheezing, rhonchi or rales.  Abdominal:     General: Bowel sounds are normal.  Skin:    General: Skin is warm and dry.  Neurological:     Mental Status: He is alert and oriented to person, place, and time.  Psychiatric:        Mood and Affect: Mood normal.        Behavior: Behavior normal.      No results found for any visits on 07/01/23.    The ASCVD Risk score (Arnett DK, et al., 2019) failed to calculate for the following reasons:   The 2019 ASCVD risk score is only valid for ages 71 to 51    Assessment & Plan:   Stage 3b chronic kidney disease (HCC) -     Comprehensive metabolic panel with GFR  Coronary artery disease involving native coronary artery of native heart without angina pectoris -     Comprehensive metabolic panel with GFR -     Lipid panel  Type 2 diabetes mellitus with diabetic neuropathy, without long-term current use of insulin (HCC) -     Hemoglobin A1c -     Comprehensive metabolic panel with GFR  Bilateral carotid artery stenosis -     Comprehensive metabolic panel with GFR -     Clopidogrel  Bisulfate; TAKE ONE TABLET BY MOUTH ONE TIME DAILY WITH BREAKFAST  Dispense: 90 tablet; Refill: 0 -     Lipid panel    Return in about 6 months (around 12/31/2023).  Continue exercise as tolerated.  Continue hydrating well.  Continue all medications as above.    Tonna Frederic, MD

## 2023-07-04 ENCOUNTER — Other Ambulatory Visit: Payer: Self-pay | Admitting: Family Medicine

## 2023-07-31 ENCOUNTER — Other Ambulatory Visit: Payer: Self-pay | Admitting: Family Medicine

## 2023-07-31 DIAGNOSIS — K219 Gastro-esophageal reflux disease without esophagitis: Secondary | ICD-10-CM

## 2023-08-04 ENCOUNTER — Other Ambulatory Visit: Payer: Self-pay | Admitting: Family Medicine

## 2023-08-04 DIAGNOSIS — I6523 Occlusion and stenosis of bilateral carotid arteries: Secondary | ICD-10-CM

## 2023-08-05 ENCOUNTER — Encounter: Payer: Self-pay | Admitting: Family Medicine

## 2023-08-05 DIAGNOSIS — M79645 Pain in left finger(s): Secondary | ICD-10-CM | POA: Diagnosis not present

## 2023-08-06 NOTE — Telephone Encounter (Signed)
 Copied from CRM [id number]. Topic: Clinical - Medication Question >> Aug 06, 2023 12:35 PM Jim Motts C wrote: Reason for CRM: Patient called in with a medication question. He sent a message to Dr. Tilmon Font. Will copy the note here.  On 08/05/2023 I went to [facility] because of swelling and pain in one of my fingers. He determined it was gout and prescribed Ibuprofen 600mg  tab 3 times a day.The pharmacy said to be very careful taking this because I could have real problems. He all gave me  Methylprednisolone 4 mg. DSPK. I needed to see someone quick so I called [facility]. I will not take either of these until you tell me what to do. Please advice Thanks

## 2023-08-07 ENCOUNTER — Ambulatory Visit (INDEPENDENT_AMBULATORY_CARE_PROVIDER_SITE_OTHER): Admitting: Family Medicine

## 2023-08-07 ENCOUNTER — Encounter: Payer: Self-pay | Admitting: Family Medicine

## 2023-08-07 VITALS — BP 108/70 | HR 65 | Temp 96.5°F | Ht 68.0 in | Wt 184.6 lb

## 2023-08-07 DIAGNOSIS — M109 Gout, unspecified: Secondary | ICD-10-CM | POA: Diagnosis not present

## 2023-08-07 MED ORDER — COLCHICINE 0.6 MG PO TABS
0.6000 mg | ORAL_TABLET | Freq: Every day | ORAL | 0 refills | Status: DC
Start: 2023-08-07 — End: 2023-10-16

## 2023-08-07 NOTE — Progress Notes (Signed)
 Established Patient Office Visit   Subjective:  Patient ID: [name] Little, male    DOB: 10-Sep-1940  Age: 83 y.o. MRN: [id number]  Chief Complaint  Patient presents with   Hand Pain    Left finger pain. Pt want to ake sure he can tak eht meds prescibed by the doctor or the orthopedic office.     Hand Pain  Pertinent negatives include no tingling.   Encounter Diagnoses  Name Primary?   Acute gout of left hand, unspecified cause Yes   Awoke this past Thursday morning with pain and swelling in his left fourth PIP.  He was seen by his orthopedist and x-rays were obtained.  The orthopedist was able to diagnose gout from the findings on his x-ray.  Patient does have a history of gout and mildly elevated uric acid.  He was given a methylprednisolone Dosepak and 600 mg of IBU to take as needed.     Review of Systems  Constitutional: Negative.   HENT: Negative.    Eyes:  Negative for blurred vision, discharge and redness.  Respiratory: Negative.    Cardiovascular: Negative.   Gastrointestinal:  Negative for abdominal pain.  Genitourinary: Negative.   Musculoskeletal:  Positive for joint pain. Negative for myalgias.  Skin:  Negative for rash.  Neurological:  Negative for tingling, loss of consciousness and weakness.  Endo/Heme/Allergies:  Negative for polydipsia.     Current Outpatient Medications:    acetaminophen  (TYLENOL ) 500 MG tablet, Take 1,000 mg by mouth every 6 (six) hours as needed for mild pain, moderate pain or headache (Allergies). , Disp: , Rfl:    amLODipine  (NORVASC ) 2.5 MG tablet, TAKE ONE TABLET BY MOUTH ONE TIME DAILY, Disp: 90 tablet, Rfl: 3   atorvastatin  (LIPITOR) 40 MG tablet, TAKE ONE TABLET BY MOUTH AT BEDTIME, Disp: 90 tablet, Rfl: 4   Azelastine  HCl 137 MCG/SPRAY SOLN, SPRAY TWO SPRAYS IN EACH NOSTRIL ONE TIME DAILY AT BEDTIME AS NEEDED FOR RUNNY NOSE OR ALLERGIES, Disp: 30 mL, Rfl: 12   clopidogrel  (PLAVIX ) 75 MG tablet, TAKE ONE TABLET BY MOUTH ONE  TIME DAILY WITH BREAKFAST, Disp: 90 tablet, Rfl: 0   Coenzyme Q10 (COQ10) 400 MG CAPS, Take 400 mg by mouth daily. , Disp: , Rfl:    colchicine  0.6 MG tablet, Take 1 tablet (0.6 mg total) by mouth daily for 7 days., Disp: 7 tablet, Rfl: 0   diclofenac  Sodium (VOLTAREN ) 1 % GEL, Apply a small grape sized dollop to left ankle 3 times daily., Disp: 150 g, Rfl: 1   enalapril  (VASOTEC ) 10 MG tablet, TAKE ONE TABLET BY MOUTH TWICE A DAY, Disp: 180 tablet, Rfl: 0   Flaxseed, Linseed, (FLAX SEED OIL PO), Take 1,300 mg by mouth daily at 12 noon. Omega 3, Disp: , Rfl:    furosemide  (LASIX ) 20 MG tablet, TAKE ONE TABLET BY MOUTH ONE TIME DAILY, Disp: 90 tablet, Rfl: 1   JARDIANCE  10 MG TABS tablet, TAKE ONE TABLET BY MOUTH ONE TIME DAILY BEFORE BREAKFAST, Disp: 30 tablet, Rfl: 5   metoprolol  tartrate (LOPRESSOR ) 50 MG tablet, TAKE ONE TABLET BY MOUTH TWICE A DAY, Disp: 180 tablet, Rfl: 1   pantoprazole  (PROTONIX ) 40 MG tablet, TAKE ONE TABLET BY MOUTH ONE TIME DAILY, Disp: 90 tablet, Rfl: 1  Current Facility-Administered Medications:    sodium chloride  flush (NS) 0.9 % injection 3 mL, 3 mL, Intravenous, Q12H, Avanell Leigh, MD   Objective:     BP 108/70 (Cuff Size: Normal)  Pulse 65   Temp (!) 96.5 F (35.8 C) (Temporal)   Ht 5\' 8"  (1.727 m)   Wt 184 lb 9.6 oz (83.7 kg)   SpO2 98%   BMI 28.07 kg/m    Physical Exam Constitutional:      General: He is not in acute distress.    Appearance: Normal appearance. He is not ill-appearing, toxic-appearing or diaphoretic.  HENT:     Head: Normocephalic and atraumatic.     Right Ear: External ear normal.     Left Ear: External ear normal.  Eyes:     General: No scleral icterus.       Right eye: No discharge.        Left eye: No discharge.     Extraocular Movements: Extraocular movements intact.     Conjunctiva/sclera: Conjunctivae normal.  Pulmonary:     Effort: Pulmonary effort is normal. No respiratory distress.  Musculoskeletal:      Left hand: Swelling and tenderness present. Decreased range of motion.       Arms:  Skin:    General: Skin is warm and dry.  Neurological:     Mental Status: He is alert and oriented to person, place, and time.  Psychiatric:        Mood and Affect: Mood normal.        Behavior: Behavior normal.      No results found for any visits on 08/07/23.    The ASCVD Risk score (Arnett DK, et al., 2019) failed to calculate for the following reasons:   The 2019 ASCVD risk score is only valid for ages 16 to 46    Assessment & Plan:   Acute gout of left hand, unspecified cause -     Colchicine ; Take 1 tablet (0.6 mg total) by mouth daily for 7 days.  Dispense: 7 tablet; Refill: 0 -     Uric acid -     Urinalysis, Routine w reflex microscopic    Return in about 8 weeks (around 10/02/2023).  Patient was advised not to take the ibuprofen or any other NSAIDs.  He is taking Plavix .  Also history of CKD stage IIIb.  Advised to start the Dosepak and colchicine  0.6 twice daily for 7 days.  Will consider starting allopurinol but will need to consider CKD.  Information was given on gout and a low purine diet.  Tonna Frederic, MD

## 2023-08-08 ENCOUNTER — Ambulatory Visit: Payer: Self-pay | Admitting: Family Medicine

## 2023-08-08 LAB — URIC ACID: Uric Acid, Serum: 8.6 mg/dL — ABNORMAL HIGH (ref 4.0–7.8)

## 2023-09-12 ENCOUNTER — Other Ambulatory Visit: Payer: Self-pay | Admitting: Family Medicine

## 2023-10-02 ENCOUNTER — Encounter: Payer: Self-pay | Admitting: Family Medicine

## 2023-10-02 ENCOUNTER — Ambulatory Visit (INDEPENDENT_AMBULATORY_CARE_PROVIDER_SITE_OTHER): Admitting: Family Medicine

## 2023-10-02 VITALS — BP 118/78 | HR 53 | Temp 96.2°F | Ht 68.0 in | Wt 187.4 lb

## 2023-10-02 DIAGNOSIS — K1120 Sialoadenitis, unspecified: Secondary | ICD-10-CM | POA: Diagnosis not present

## 2023-10-02 DIAGNOSIS — N1832 Chronic kidney disease, stage 3b: Secondary | ICD-10-CM

## 2023-10-02 DIAGNOSIS — Z8739 Personal history of other diseases of the musculoskeletal system and connective tissue: Secondary | ICD-10-CM | POA: Diagnosis not present

## 2023-10-02 NOTE — Progress Notes (Signed)
 Established Patient Office Visit   Subjective:  Patient ID: [name] Little, male    DOB: 1940/10/14  Age: 83 y.o. MRN: [id number]  No chief complaint on file.   HPI Encounter Diagnoses  Name Primary?   Sialadenitis Yes   History of gout    Gouty attack has resolved with prednisone and colchicine .  His last attack was in July 2023.  He does not drink alcohol and rarely consumes red meat or shellfish.  History of sialoadenitis.  This continues to bother him.   Review of Systems  Constitutional: Negative.   HENT: Negative.    Eyes:  Negative for blurred vision, discharge and redness.  Respiratory: Negative.    Cardiovascular: Negative.   Gastrointestinal:  Negative for abdominal pain.  Genitourinary: Negative.   Musculoskeletal: Negative.  Negative for myalgias.  Skin:  Negative for rash.  Neurological:  Negative for tingling, loss of consciousness and weakness.  Endo/Heme/Allergies:  Negative for polydipsia.     Current Outpatient Medications:    acetaminophen  (TYLENOL ) 500 MG tablet, Take 1,000 mg by mouth every 6 (six) hours as needed for mild pain, moderate pain or headache (Allergies). , Disp: , Rfl:    amLODipine  (NORVASC ) 2.5 MG tablet, TAKE ONE TABLET BY MOUTH ONE TIME DAILY, Disp: 90 tablet, Rfl: 3   atorvastatin  (LIPITOR) 40 MG tablet, TAKE ONE TABLET BY MOUTH AT BEDTIME, Disp: 90 tablet, Rfl: 4   Azelastine  HCl 137 MCG/SPRAY SOLN, SPRAY TWO SPRAYS IN EACH NOSTRIL ONE TIME DAILY AT BEDTIME AS NEEDED FOR RUNNY NOSE OR ALLERGIES, Disp: 30 mL, Rfl: 12   clopidogrel  (PLAVIX ) 75 MG tablet, TAKE ONE TABLET BY MOUTH ONE TIME DAILY WITH BREAKFAST, Disp: 90 tablet, Rfl: 0   Coenzyme Q10 (COQ10) 400 MG CAPS, Take 400 mg by mouth daily. , Disp: , Rfl:    diclofenac  Sodium (VOLTAREN ) 1 % GEL, Apply a small grape sized dollop to left ankle 3 times daily., Disp: 150 g, Rfl: 1   enalapril  (VASOTEC ) 10 MG tablet, TAKE ONE TABLET BY MOUTH TWICE A DAY, Disp: 180 tablet, Rfl: 0    Flaxseed, Linseed, (FLAX SEED OIL PO), Take 1,300 mg by mouth daily at 12 noon. Omega 3, Disp: , Rfl:    furosemide  (LASIX ) 20 MG tablet, TAKE ONE TABLET BY MOUTH ONE TIME DAILY, Disp: 90 tablet, Rfl: 1   JARDIANCE  10 MG TABS tablet, TAKE ONE TABLET BY MOUTH ONE TIME DAILY BEFORE BREAKFAST, Disp: 30 tablet, Rfl: 5   metoprolol  tartrate (LOPRESSOR ) 50 MG tablet, TAKE ONE TABLET BY MOUTH TWICE A DAY, Disp: 180 tablet, Rfl: 1   pantoprazole  (PROTONIX ) 40 MG tablet, TAKE ONE TABLET BY MOUTH ONE TIME DAILY, Disp: 90 tablet, Rfl: 1   colchicine  0.6 MG tablet, Take 1 tablet (0.6 mg total) by mouth daily for 7 days. (Patient not taking: Reported on 10/02/2023), Disp: 7 tablet, Rfl: 0  Current Facility-Administered Medications:    sodium chloride  flush (NS) 0.9 % injection 3 mL, 3 mL, Intravenous, Q12H, Court Dorn PARAS, MD   Objective:     BP 118/78 (BP Location: Right Arm, Patient Position: Sitting, Cuff Size: Normal)   Pulse (!) 53   Temp (!) 96.2 F (35.7 C) (Temporal)   Ht 5' 8 (1.727 m)   Wt 187 lb 6.4 oz (85 kg)   SpO2 98%   BMI 28.49 kg/m    Physical Exam Constitutional:      General: He is not in acute distress.    Appearance:  Normal appearance. He is not ill-appearing, toxic-appearing or diaphoretic.  HENT:     Head: Normocephalic and atraumatic.     Right Ear: External ear normal.     Left Ear: External ear normal.  Eyes:     General: No scleral icterus.       Right eye: No discharge.        Left eye: No discharge.     Extraocular Movements: Extraocular movements intact.     Conjunctiva/sclera: Conjunctivae normal.  Pulmonary:     Effort: Pulmonary effort is normal. No respiratory distress.  Skin:    General: Skin is warm and dry.  Neurological:     Mental Status: He is alert and oriented to person, place, and time.  Psychiatric:        Mood and Affect: Mood normal.        Behavior: Behavior normal.      No results found for any visits on 10/02/23.    The  ASCVD Risk score (Arnett DK, et al., 2019) failed to calculate for the following reasons:   The 2019 ASCVD risk score is only valid for ages 68 to 21    Assessment & Plan:   Sialadenitis -     Ambulatory referral to ENT  History of gout    Return Follow-up as previously directed., for chronic disease follow-up.  Given information on gout and a low purine eating plan to avoid further attacks.  With shared decision making we decided to hold treatment with allopurinol at this point, in consideration of his CKD 3B status.  If attacks become more frequent we could reconsider using it.  Spent 25 minutes discussing this with him.  Elsie Sim Lent, MD

## 2023-10-09 DIAGNOSIS — E119 Type 2 diabetes mellitus without complications: Secondary | ICD-10-CM | POA: Diagnosis not present

## 2023-10-09 DIAGNOSIS — H53143 Visual discomfort, bilateral: Secondary | ICD-10-CM | POA: Diagnosis not present

## 2023-10-09 DIAGNOSIS — H5203 Hypermetropia, bilateral: Secondary | ICD-10-CM | POA: Diagnosis not present

## 2023-10-13 ENCOUNTER — Other Ambulatory Visit: Payer: Self-pay | Admitting: Family Medicine

## 2023-10-13 DIAGNOSIS — M109 Gout, unspecified: Secondary | ICD-10-CM

## 2023-10-31 ENCOUNTER — Ambulatory Visit (INDEPENDENT_AMBULATORY_CARE_PROVIDER_SITE_OTHER): Admitting: Family Medicine

## 2023-10-31 ENCOUNTER — Encounter: Payer: Self-pay | Admitting: Family Medicine

## 2023-10-31 VITALS — BP 136/72 | HR 66 | Temp 97.8°F | Resp 18 | Ht 68.0 in | Wt 189.6 lb

## 2023-10-31 DIAGNOSIS — H6123 Impacted cerumen, bilateral: Secondary | ICD-10-CM | POA: Diagnosis not present

## 2023-10-31 DIAGNOSIS — H60331 Swimmer's ear, right ear: Secondary | ICD-10-CM | POA: Diagnosis not present

## 2023-10-31 MED ORDER — OFLOXACIN 0.3 % OT SOLN: 10.0000 [drp] | Freq: Every day | OTIC | 0 refills | Status: AC

## 2023-10-31 NOTE — Progress Notes (Signed)
 Subjective:    Patient ID: [name] Little, male    DOB: 02-06-1941, 83 y.o.   MRN: [id number]  Chief Complaint  Patient presents with   Ear Fullness    Bilateral, pt occ pain once in a while    HPI Patient is in today for ear irrigation.   Discussed the use of AI scribe software for clinical note transcription with the patient, who gave verbal consent to proceed.  History of Present Illness [name] Little is an 83 year old male who presents with earwax buildup causing hearing difficulties.  He has been experiencing earwax buildup, which is affecting his ability to hear and understand phrases. He notes difficulty hearing sounds that his wife can hear, especially when in another room.  He attempted to undergo a hearing test the previous day, during which the wax buildup was discovered in both ears. He recalls having earwax removal done before, but not since he was a child.  He does not directly report pain associated with the earwax buildup.    Past Medical History:  Diagnosis Date   Allergic rhinitis    Anxiety and depression    Arthritis    CAD (coronary artery disease)    Diabetes mellitus    GERD (gastroesophageal reflux disease)    Headache(784.0) 06/2003   s/p neuro eval ? migraine   Hyperlipidemia    Hyperplastic colon polyp 2008   Hypertension    Iron deficiency    h/o   Ischemic cardiomyopathy    EF had 30-35%. The most recent EF was 50% on echo in september 2009   PUD (peptic ulcer disease) 7/11   EGD showed few small ulcers, esophagues strecthed   Vision abnormalities     Past Surgical History:  Procedure Laterality Date   ABDOMINAL AORTAGRAM  02/03/2020   ABDOMINAL AORTOGRAM W/LOWER EXTREMITY N/A 02/03/2020   Procedure: ABDOMINAL AORTOGRAM W/LOWER EXTREMITY;  Surgeon: Court Dorn PARAS, MD;  Location: [facility];  Service: Cardiovascular;  Laterality: N/A;   CATARACT EXTRACTION Right 07-2012   CORONARY ARTERY BYPASS GRAFT  2004    LIMA to the LADs, sequentail SVG to intermediated and obtuse marginal, sequential SVG to PDA and posterolaterla   INGUINAL HERNIA REPAIR Bilateral    PERIPHERAL VASCULAR INTERVENTION Right 02/03/2020   Procedure: PERIPHERAL VASCULAR INTERVENTION;  Surgeon: Court Dorn PARAS, MD;  Location: [facility];  Service: Cardiovascular;  Laterality: Right;   TONSILLECTOMY      Family History  Problem Relation Age of Onset   Diabetes Father    Heart attack Father    Heart attack Mother    Cancer Paternal Grandfather        type unknown   Colon cancer Neg Hx    Prostate cancer Neg Hx     Social History   Socioeconomic History   Marital status: Married    Spouse name: Not on file   Number of children: 0   Years of education: Not on file   Highest education level: Some college, no degree  Occupational History   Occupation: retired Production designer, theatre/television/film   Tobacco Use   Smoking status: Former    Current packs/day: 0.00    Types: Cigarettes    Quit date: 12/16/1976    Years since quitting: 46.9   Smokeless tobacco: Never  Vaping Use   Vaping status: Never Used  Substance and Sexual Activity   Alcohol use: No    Alcohol/week: 0.0 standard drinks of alcohol  Drug use: No   Sexual activity: Not on file  Other Topics Concern   Not on file  Social History Narrative   Married , lives w/ wife , no children         Social Drivers of Health   Financial Resource Strain: Low Risk  (10/01/2023)   Overall Financial Resource Strain (CARDIA)    Difficulty of Paying Living Expenses: Not very hard  Food Insecurity: No Food Insecurity (10/01/2023)   Hunger Vital Sign    Worried About Running Out of Food in the Last Year: Never true    Ran Out of Food in the Last Year: Never true  Transportation Needs: No Transportation Needs (10/01/2023)   PRAPARE - Administrator, Civil Service (Medical): No    Lack of Transportation (Non-Medical): No  Physical Activity: Sufficiently Active (10/01/2023)    Exercise Vital Sign    Days of Exercise per Week: 5 days    Minutes of Exercise per Session: 40 min  Stress: Stress Concern Present (10/01/2023)   [employer] of Occupational Health - Occupational Stress Questionnaire    Feeling of Stress: To some extent  Social Connections: Socially Isolated (10/01/2023)   Social Connection and Isolation Panel    Frequency of Communication with Friends and Family: Never    Frequency of Social Gatherings with Friends and Family: Never    Attends Religious Services: Patient declined    Database administrator or Organizations: No    Attends Engineer, structural: Not on file    Marital Status: Married  Catering manager Violence: Not on file    Outpatient Medications Prior to Visit  Medication Sig Dispense Refill   acetaminophen  (TYLENOL ) 500 MG tablet Take 1,000 mg by mouth every 6 (six) hours as needed for mild pain, moderate pain or headache (Allergies).      amLODipine  (NORVASC ) 2.5 MG tablet TAKE ONE TABLET BY MOUTH ONE TIME DAILY 90 tablet 3   atorvastatin  (LIPITOR) 40 MG tablet TAKE ONE TABLET BY MOUTH AT BEDTIME 90 tablet 4   Azelastine  HCl 137 MCG/SPRAY SOLN SPRAY TWO SPRAYS IN EACH NOSTRIL ONE TIME DAILY AT BEDTIME AS NEEDED FOR RUNNY NOSE OR ALLERGIES 30 mL 12   clopidogrel  (PLAVIX ) 75 MG tablet TAKE ONE TABLET BY MOUTH ONE TIME DAILY WITH BREAKFAST 90 tablet 0   Coenzyme Q10 (COQ10) 400 MG CAPS Take 400 mg by mouth daily.      colchicine  0.6 MG tablet TAKE ONE TABLET BY MOUTH DAILY FOR SEVEN DAYS 7 tablet 0   diclofenac  Sodium (VOLTAREN ) 1 % GEL Apply a small grape sized dollop to left ankle 3 times daily. 150 g 1   enalapril  (VASOTEC ) 10 MG tablet TAKE ONE TABLET BY MOUTH TWICE A DAY 180 tablet 0   Flaxseed, Linseed, (FLAX SEED OIL PO) Take 1,300 mg by mouth daily at 12 noon. Omega 3     furosemide  (LASIX ) 20 MG tablet TAKE ONE TABLET BY MOUTH ONE TIME DAILY 90 tablet 1   JARDIANCE  10 MG TABS tablet TAKE ONE TABLET BY MOUTH ONE  TIME DAILY BEFORE BREAKFAST 30 tablet 5   metoprolol  tartrate (LOPRESSOR ) 50 MG tablet TAKE ONE TABLET BY MOUTH TWICE A DAY 180 tablet 1   pantoprazole  (PROTONIX ) 40 MG tablet TAKE ONE TABLET BY MOUTH ONE TIME DAILY 90 tablet 1   Facility-Administered Medications Prior to Visit  Medication Dose Route Frequency Provider Last Rate Last Admin   sodium chloride  flush (NS) 0.9 % injection  3 mL  3 mL Intravenous Q12H Court Dorn PARAS, MD        Allergies  Allergen Reactions   Micardis [Telmisartan]     REACTION: diarrhea    Review of Systems  Constitutional:  Negative for fever and malaise/fatigue.  HENT:  Positive for hearing loss. Negative for congestion.   Eyes:  Negative for blurred vision.  Respiratory:  Negative for shortness of breath.   Cardiovascular:  Negative for chest pain, palpitations and leg swelling.  Gastrointestinal:  Negative for abdominal pain, blood in stool and nausea.  Genitourinary:  Negative for dysuria and frequency.  Musculoskeletal:  Negative for falls.  Skin:  Negative for rash.  Neurological:  Negative for dizziness, loss of consciousness and headaches.  Endo/Heme/Allergies:  Negative for environmental allergies.  Psychiatric/Behavioral:  Negative for depression. The patient is not nervous/anxious.        Objective:    Physical Exam Vitals and nursing note reviewed.  HENT:     Right Ear: There is impacted cerumen.     Left Ear: There is impacted cerumen.     Ears:     Comments: Ears irrigated ----  successfully  With no complications  R ear---- canal erythematous     BP 136/72 (BP Location: Right Arm, Patient Position: Sitting, Cuff Size: Normal)   Pulse 66   Temp 97.8 F (36.6 C) (Oral)   Resp 18   Ht 5' 8 (1.727 m)   Wt 189 lb 9.6 oz (86 kg)   SpO2 97%   BMI 28.83 kg/m  Wt Readings from Last 3 Encounters:  10/31/23 189 lb 9.6 oz (86 kg)  10/02/23 187 lb 6.4 oz (85 kg)  08/07/23 184 lb 9.6 oz (83.7 kg)    Diabetic Foot Exam -  Simple   No data filed    Lab Results  Component Value Date   WBC 7.3 12/31/2022   HGB 14.5 12/31/2022   HCT 46.9 12/31/2022   PLT 168.0 12/31/2022   GLUCOSE 126 (H) 07/01/2023   CHOL 116 07/01/2023   TRIG 91.0 07/01/2023   HDL 32.00 (L) 07/01/2023   LDLDIRECT 75.0 12/31/2022   LDLCALC 65 07/01/2023   ALT 18 07/01/2023   AST 16 07/01/2023   NA 136 07/01/2023   K 4.8 07/01/2023   CL 98 07/01/2023   CREATININE 1.64 (H) 07/01/2023   BUN 30 (H) 07/01/2023   CO2 32 07/01/2023   TSH 0.50 10/21/2019   PSA 0.88 06/01/2019   INR 1.02 07/22/2014   HGBA1C 7.0 (H) 07/01/2023   MICROALBUR 0.4 03/31/2009    Lab Results  Component Value Date   TSH 0.50 10/21/2019   Lab Results  Component Value Date   WBC 7.3 12/31/2022   HGB 14.5 12/31/2022   HCT 46.9 12/31/2022   MCV 85.3 12/31/2022   PLT 168.0 12/31/2022   Lab Results  Component Value Date   NA 136 07/01/2023   K 4.8 07/01/2023   CO2 32 07/01/2023   GLUCOSE 126 (H) 07/01/2023   BUN 30 (H) 07/01/2023   CREATININE 1.64 (H) 07/01/2023   BILITOT 0.8 07/01/2023   ALKPHOS 69 07/01/2023   AST 16 07/01/2023   ALT 18 07/01/2023   PROT 6.4 07/01/2023   ALBUMIN 4.2 07/01/2023   CALCIUM  9.4 07/01/2023   ANIONGAP 9 02/04/2020   GFR 38.41 (L) 07/01/2023   Lab Results  Component Value Date   CHOL 116 07/01/2023   Lab Results  Component Value Date   HDL 32.00 (L)  07/01/2023   Lab Results  Component Value Date   LDLCALC 65 07/01/2023   Lab Results  Component Value Date   TRIG 91.0 07/01/2023   Lab Results  Component Value Date   CHOLHDL 4 07/01/2023   Lab Results  Component Value Date   HGBA1C 7.0 (H) 07/01/2023       Assessment & Plan:  Acute swimmer's ear of right side -     Ofloxacin ; Place 10 drops into the right ear daily.  Dispense: 10 mL; Refill: 0  Bilateral impacted cerumen  Assessment and Plan Assessment & Plan Bilateral impacted cerumen   Chronic bilateral impacted cerumen is causing  hearing difficulties, identified during a recent hearing test. Perform water irrigation to remove impacted cerumen.  Floxin  gtts for otitis externa   Jamee JONELLE Antonio Cyndee, DO

## 2023-10-31 NOTE — Patient Instructions (Signed)
 Earwax Buildup, Adult Your ears make something called earwax. It helps keep germs called bacteria away and protects the skin in your ears. Sometimes, too much earwax can build up. This can cause discomfort or make it harder to hear. What are the causes? Earwax buildup can happen when you have too much earwax in your ears. Earwax is made in the outer part of your ear canal. It's supposed to fall out in small amounts over time. But if your ears aren't able to clean themselves like they should, earwax can build up. What increases the risk? You're more likely to get earwax buildup if: You clean your ears with cotton swabs. You pick at your ears. You use earplugs or in-ear headphones a lot. You wear hearing aids. You may also be more likely to get it if: You're male. You're older. Your ears naturally make more earwax. You have narrow ear canals or extra hair in your ears. Your earwax is too thick or sticky. You have eczema. You're dehydrated. This means there's not enough fluid in your body. What are the signs or symptoms? Symptoms of earwax buildup include: Not being able to hear as well. A feeling of fullness in your ear. Feeling like your ear is plugged. Fluid coming from your ear. Ear pain or an itchy ear. Ringing in your ear. Coughing or problems with balance. How is this diagnosed? Earwax buildup may be diagnosed based on your symptoms, medical history, and an ear exam. During the exam, your health care provider will look into your ear with a tool called an otoscope. You may also have tests, such as a hearing test. How is this treated? Earwax buildup may be treated by: Using ear drops. Having the earwax removed by a provider. The provider may: Flush the ear with water. Use a tool called a curette that has a loop on the end. Use a suction device. Having surgery. This may be done in severe cases. Follow these instructions at home:  Cleaning your ears Clean your ears as told  by your provider. You can clean the outside of your ears with a washcloth or tissue. Do not overclean your ears. Do not put anything into your ear unless told. This includes cotton swabs. General instructions Take over-the-counter and prescription medicines only as told by your provider. Drink enough fluid to keep your pee (urine) pale yellow. This helps thin the earwax. If you have hearing aids, clean them as told. Keep all follow-up visits. If earwax builds up in your ears often or if you use hearing aids, ask your provider how often you should have your ears cleaned. Contact a health care provider if: Your ear pain gets worse. You have a fever. You have pus, blood, or other fluid coming from your ear. You have hearing loss. You have ringing in your ears that won't go away. You feel like the room is spinning. This is called vertigo. Your symptoms don't get better with treatment. This information is not intended to replace advice given to you by your health care provider. Make sure you discuss any questions you have with your health care provider. Document Revised: 05/02/2022 Document Reviewed: 05/02/2022 Elsevier Patient Education  2024 [employer].

## 2023-11-17 ENCOUNTER — Ambulatory Visit (INDEPENDENT_AMBULATORY_CARE_PROVIDER_SITE_OTHER): Admitting: Otolaryngology

## 2023-11-17 ENCOUNTER — Encounter (INDEPENDENT_AMBULATORY_CARE_PROVIDER_SITE_OTHER): Payer: Self-pay | Admitting: Otolaryngology

## 2023-11-17 VITALS — BP 157/83 | HR 56

## 2023-11-17 DIAGNOSIS — J3489 Other specified disorders of nose and nasal sinuses: Secondary | ICD-10-CM

## 2023-11-17 DIAGNOSIS — R0981 Nasal congestion: Secondary | ICD-10-CM | POA: Diagnosis not present

## 2023-11-17 DIAGNOSIS — R0982 Postnasal drip: Secondary | ICD-10-CM

## 2023-11-17 DIAGNOSIS — J342 Deviated nasal septum: Secondary | ICD-10-CM

## 2023-11-17 DIAGNOSIS — H9193 Unspecified hearing loss, bilateral: Secondary | ICD-10-CM | POA: Diagnosis not present

## 2023-11-17 DIAGNOSIS — K1120 Sialoadenitis, unspecified: Secondary | ICD-10-CM

## 2023-11-17 DIAGNOSIS — J3089 Other allergic rhinitis: Secondary | ICD-10-CM

## 2023-11-17 DIAGNOSIS — J343 Hypertrophy of nasal turbinates: Secondary | ICD-10-CM

## 2023-11-17 MED ORDER — FLUTICASONE PROPIONATE 50 MCG/ACT NA SUSP: 2.0000 | Freq: Two times a day (BID) | NASAL | 6 refills | Status: AC

## 2023-11-17 MED ORDER — LEVOCETIRIZINE DIHYDROCHLORIDE 5 MG PO TABS: 5.0000 mg | ORAL_TABLET | Freq: Every evening | ORAL | 3 refills | Status: AC

## 2023-11-17 NOTE — Progress Notes (Signed)
 ENT CONSULT:  Reason for Consult: episode of sialoadenitis and chronic nasal congestion/drainage   HPI: Discussed the use of AI scribe software for clinical note transcription with the patient, who gave verbal consent to proceed.  History of Present Illness [name] Little is an 83 year old male who presents after an episode of a swelling of the salivary glands.  He experienced swelling of the salivary glands last month, which resolved without treatment, and it was affecting right submandibular gland. He recalls a similar episode years ago but does not remember receiving any specific treatment at that time or for this episode.   He has a history of environmental allergies and currently uses Flonase , although he feels it is not very effective. He experiences nasal drainage, particularly at night and in the mornings, and describes it as 'muscle congestion'. He had a deviated septum corrected surgically about fifty years ago. He experiences some nasal drainage but not significant congestion. He recalls a past procedure where his sinuses were washed out when he was a child, which provided significant relief at the time.  He mentions a recent ear cleaning but does not specify any current ear-related symptoms. He expresses a need for a hearing exam due to difficulty with hearing clarity, stating that he sometimes 'misses words or the words get jumbled up'. No recent sinus infections requiring antibiotics and does not recall having chronic sinus disease.   Records Reviewed:  Seen for ear infection 10/31/23  Office Visit for sialoadenitis  Gouty attack has resolved with prednisone and colchicine . His last attack was in July 2023. He does not drink alcohol and rarely consumes red meat or shellfish. History of sialoadenitis. This continues to bother him.    Past Medical History:  Diagnosis Date   Allergic rhinitis    Anxiety and depression    Arthritis    CAD (coronary artery disease)     Diabetes mellitus    GERD (gastroesophageal reflux disease)    Headache(784.0) 06/2003   s/p neuro eval ? migraine   Hyperlipidemia    Hyperplastic colon polyp 2008   Hypertension    Iron deficiency    h/o   Ischemic cardiomyopathy    EF had 30-35%. The most recent EF was 50% on echo in september 2009   PUD (peptic ulcer disease) 7/11   EGD showed few small ulcers, esophagues strecthed   Vision abnormalities     Past Surgical History:  Procedure Laterality Date   ABDOMINAL AORTAGRAM  02/03/2020   ABDOMINAL AORTOGRAM W/LOWER EXTREMITY N/A 02/03/2020   Procedure: ABDOMINAL AORTOGRAM W/LOWER EXTREMITY;  Surgeon: Court Dorn PARAS, MD;  Location: [facility];  Service: Cardiovascular;  Laterality: N/A;   CATARACT EXTRACTION Right 07-2012   CORONARY ARTERY BYPASS GRAFT  2004   LIMA to the LADs, sequentail SVG to intermediated and obtuse marginal, sequential SVG to PDA and posterolaterla   INGUINAL HERNIA REPAIR Bilateral    PERIPHERAL VASCULAR INTERVENTION Right 02/03/2020   Procedure: PERIPHERAL VASCULAR INTERVENTION;  Surgeon: Court Dorn PARAS, MD;  Location: [facility];  Service: Cardiovascular;  Laterality: Right;   TONSILLECTOMY      Family History  Problem Relation Age of Onset   Diabetes Father    Heart attack Father    Heart attack Mother    Cancer Paternal Grandfather        type unknown   Colon cancer Neg Hx    Prostate cancer Neg Hx     Social History:  reports  that he quit smoking about 46 years ago. His smoking use included cigarettes. He has never used smokeless tobacco. He reports that he does not drink alcohol and does not use drugs.  Allergies:  Allergies  Allergen Reactions   Micardis [Telmisartan]     REACTION: diarrhea    Medications: I have reviewed the patient's current medications.  The PMH, PSH, Medications, Allergies, and SH were reviewed and updated.  ROS: Constitutional: Negative for fever, weight loss and weight  gain. Cardiovascular: Negative for chest pain and dyspnea on exertion. Respiratory: Is not experiencing shortness of breath at rest. Gastrointestinal: Negative for nausea and vomiting. Neurological: Negative for headaches. Psychiatric: The patient is not nervous/anxious  Blood pressure (!) 157/83, pulse (!) 56, SpO2 95%. There is no height or weight on file to calculate BMI.  PHYSICAL EXAM:  Exam: General: Well-developed, well-nourished Respiratory Respiratory effort: Equal inspiration and expiration without stridor Cardiovascular Peripheral Vascular: Warm extremities with equal color/perfusion Eyes: No nystagmus with equal extraocular motion bilaterally Neuro/Psych/Balance: Patient oriented to person, place, and time; Appropriate mood and affect; Gait is intact with no imbalance; Cranial nerves I-XII are intact Head and Face Inspection: Normocephalic and atraumatic without mass or lesion Palpation: Facial skeleton intact without bony stepoffs Salivary Glands: No mass or tenderness Facial Strength: Facial motility symmetric and full bilaterally ENT Pinna: External ear intact and fully developed External canal: Canal is patent with intact skin Tympanic Membrane: Clear and mobile External Nose: No scar or anatomic deformity Internal Nose: Septum is deviated to the left. No polyp, or purulence. Mucosal edema and erythema present.  Bilateral inferior turbinate hypertrophy.  Lips, Teeth, and gums: Mucosa and teeth intact and viable TMJ: No pain to palpation with full mobility Oral cavity/oropharynx: No erythema or exudate, no lesions present Nasopharynx: No mass or lesion with intact mucosa Hypopharynx: Intact mucosa without pooling of secretions Neck Neck and Trachea: Midline trachea without mass or lesion Thyroid : No mass or nodularity Lymphatics: No lymphadenopathy  Procedure:   PROCEDURE NOTE: nasal endoscopy  Preoperative diagnosis: chronic sinusitis  symptoms  Postoperative diagnosis: same  Procedure: Diagnostic nasal endoscopy (68768)  Surgeon: Elena Larry, M.D.  Anesthesia: Topical lidocaine  and Afrin  H&P REVIEW: The patient's history and physical were reviewed today prior to procedure. All medications were reviewed and updated as well. Complications: None Condition is stable throughout exam Indications and consent: The patient presents with symptoms of chronic sinusitis not responding to previous therapies. All the risks, benefits, and potential complications were reviewed with the patient preoperatively and informed consent was obtained. The time out was completed with confirmation of the correct procedure.   Procedure: The patient was seated upright in the clinic. Topical lidocaine  and Afrin were applied to the nasal cavity. After adequate anesthesia had occurred, the rigid nasal endoscope was passed into the nasal cavity. The nasal mucosa, turbinates, septum, and sinus drainage pathways were visualized bilaterally. This revealed no purulence or significant secretions that might be cultured. There were no polyps or sites of significant inflammation. The mucosa was intact and there was no crusting present. The scope was then slowly withdrawn and the patient tolerated the procedure well. There were no complications or blood loss.   Studies Reviewed: 07/20/2012 CT max/face IMPRESSION:  Nasal septal deviation to the left, minimal.  No sinus opacification identified.   Assessment/Plan: Encounter Diagnoses  Name Primary?   Sialoadenitis Yes   Nasal septal deviation    Hypertrophy of both inferior nasal turbinates    Environmental and seasonal allergies  Chronic nasal congestion    Nasal obstruction    Decreased hearing of both ears     Assessment and Plan Assessment & Plan Chronic nasal congestion and post-nasal drainage, suspected environmental allergies Currently on Flonase  with sx.  - Prescribed Xyzal  5 mg at  night. - Continue Flonase  nasal spray BID - Recommend daily nasal saline rinses with distilled water and salt packet.  Episode of right SMG swelling, suspected sialoadenitis  Sx resolved without treatment. No palpable masses or lesions on exam. No dry mouth. No recurrent sx.  - Advised use of lemon cough drops or sour candy to stimulate salivary gland flow if swelling recurs.  - Advised adequate hydration - Apply warm compresses and perform gland massage if swelling recurs. - Consider steroids and antibiotics if swelling recurs and persists. - Advised to call if swelling recurs for potential imaging or further intervention.  Bilateral hearing loss Reports difficulty with hearing characterized by missing or jumbled words. No recent hearing evaluation  - Schedule a hearing exam.      Thank you for allowing me to participate in the care of this patient. Please do not hesitate to contact me with any questions or concerns.   Elena Larry, MD Otolaryngology [facility] Phone: [phone] Fax: [phone]    11/17/2023, 9:00 AM

## 2023-11-17 NOTE — Patient Instructions (Signed)
 [facility] Nasal Saline Rinse   - start nasal saline rinses with [facility]le available over the counter or online to help with nasal congestion     Sialoadenitis (Salivary Gland Infection)    Salivary glands make saliva, or spit. An infection in these glands can make the glands swell and hurt.  An infection can happen when bacteria gets into the gland. This is more common in people who have diabetes, poor tooth care, or stones in these glands. Bacteria can build up and cause an infection if you don't get enough fluids. It can also happen if the flow of saliva gets blocked by a small stone in the gland. A virus can also cause an infection.  Your care depends on the cause. If the problem is caused by bacteria, your doctor may prescribe antibiotics.  Home treatment may help. You can drink more fluids or suck on sugar-free lemon drops to increase the flow of saliva.  Follow-up care is a key part of your treatment and safety. Be sure to make and go to all appointments, and call your doctor if you are having problems. It's also a good idea to know your test results and keep a list of the medicines you take.  How can you care for yourself at home?  If your doctor prescribed antibiotics, take them as directed. Do not stop taking them just because you feel better. You need to take the full course of antibiotics.  Take an over-the-counter pain medicine if needed, such as acetaminophen  (Tylenol ), ibuprofen (Advil, Motrin), or naproxen (Aleve). Be safe with medicines. Read and follow all instructions on the label.  Do not take two or more pain medicines at the same time unless the doctor told you to. Many pain medicines have acetaminophen , which is Tylenol . Too much acetaminophen  (Tylenol ) can be harmful.  Drink plenty of fluids. If you have kidney, heart, or liver disease and have to limit fluids, talk with your doctor before you increase the amount of fluids you drink.  Put an ice or heat pack  (whichever feels better) on the swollen jaw for 10 to 20 minutes at a time. Put a thin cloth between the ice or heat pack and your skin.  Suck on ice chips or ice treats such as sugar-free flavoured ice pops. Eat soft foods that do not have to be chewed much.  Use sugar-free gum or candies such as lemon drops. They increase saliva.  Avoid over-the-counter medicines that can give you a dry mouth. These medicines include antihistamines, such as diphenhydramine (Benadryl) or chlorpheniramine.  Gently massage the infected gland.

## 2023-11-24 ENCOUNTER — Other Ambulatory Visit: Payer: Self-pay | Admitting: Family Medicine

## 2023-11-24 DIAGNOSIS — M109 Gout, unspecified: Secondary | ICD-10-CM

## 2023-11-24 NOTE — Telephone Encounter (Unsigned)
 Copied from CRM #[id number]. Topic: Clinical - Medication Question >> Nov 24, 2023  2:36 PM Henretta I wrote: Reason for CRM: Patient would like to know if refill for colchicine  0.6 MG tablet can be done as soon as possible as it was requested today and he states he needs it. >> Nov 24, 2023  2:38 PM Henretta I wrote: Did advise of up to 3 business days

## 2023-11-25 ENCOUNTER — Encounter: Payer: Self-pay | Admitting: Family Medicine

## 2023-11-25 ENCOUNTER — Other Ambulatory Visit: Payer: Self-pay

## 2023-11-25 DIAGNOSIS — M109 Gout, unspecified: Secondary | ICD-10-CM

## 2023-11-25 MED ORDER — COLCHICINE 0.6 MG PO TABS: 0.6000 mg | ORAL_TABLET | Freq: Every day | ORAL | 0 refills | Status: DC

## 2023-11-27 MED ORDER — COLCHICINE 0.6 MG PO TABS: ORAL_TABLET | ORAL | 2 refills | Status: DC

## 2023-11-27 NOTE — Addendum Note (Signed)
 Addended by: BERNETA ELSIE LABOR on: 11/27/2023 08:08 AM   Modules accepted: Orders

## 2023-12-11 ENCOUNTER — Other Ambulatory Visit: Payer: Self-pay | Admitting: Family Medicine

## 2023-12-24 ENCOUNTER — Other Ambulatory Visit: Payer: Self-pay | Admitting: Family Medicine

## 2023-12-24 DIAGNOSIS — M109 Gout, unspecified: Secondary | ICD-10-CM

## 2023-12-26 ENCOUNTER — Other Ambulatory Visit: Payer: Self-pay | Admitting: Family Medicine

## 2023-12-26 DIAGNOSIS — M25572 Pain in left ankle and joints of left foot: Secondary | ICD-10-CM

## 2023-12-31 ENCOUNTER — Other Ambulatory Visit: Payer: Self-pay | Admitting: Family Medicine

## 2023-12-31 DIAGNOSIS — I6523 Occlusion and stenosis of bilateral carotid arteries: Secondary | ICD-10-CM

## 2024-01-01 ENCOUNTER — Ambulatory Visit: Payer: Self-pay | Admitting: Family Medicine

## 2024-01-01 ENCOUNTER — Ambulatory Visit: Admitting: Family Medicine

## 2024-01-01 ENCOUNTER — Encounter: Payer: Self-pay | Admitting: Family Medicine

## 2024-01-01 VITALS — BP 126/84 | HR 55 | Temp 97.2°F | Ht 68.0 in | Wt 183.8 lb

## 2024-01-01 DIAGNOSIS — E538 Deficiency of other specified B group vitamins: Secondary | ICD-10-CM

## 2024-01-01 DIAGNOSIS — E782 Mixed hyperlipidemia: Secondary | ICD-10-CM | POA: Diagnosis not present

## 2024-01-01 DIAGNOSIS — Z23 Encounter for immunization: Secondary | ICD-10-CM

## 2024-01-01 DIAGNOSIS — N1832 Chronic kidney disease, stage 3b: Secondary | ICD-10-CM | POA: Diagnosis not present

## 2024-01-01 DIAGNOSIS — I10 Essential (primary) hypertension: Secondary | ICD-10-CM | POA: Diagnosis not present

## 2024-01-01 DIAGNOSIS — I2510 Atherosclerotic heart disease of native coronary artery without angina pectoris: Secondary | ICD-10-CM | POA: Diagnosis not present

## 2024-01-01 DIAGNOSIS — E1140 Type 2 diabetes mellitus with diabetic neuropathy, unspecified: Secondary | ICD-10-CM

## 2024-01-01 DIAGNOSIS — Z8739 Personal history of other diseases of the musculoskeletal system and connective tissue: Secondary | ICD-10-CM

## 2024-01-01 LAB — URINALYSIS, ROUTINE W REFLEX MICROSCOPIC
Bilirubin Urine: NEGATIVE
Hgb urine dipstick: NEGATIVE
Ketones, ur: NEGATIVE
Leukocytes,Ua: NEGATIVE
Nitrite: NEGATIVE
RBC / HPF: NONE SEEN (ref 0–?)
Specific Gravity, Urine: <=1.005 — AB (ref 1.000–1.030)
Total Protein, Urine: NEGATIVE
Urine Glucose: 500 — AB
Urobilinogen, UA: 0.2 (ref 0.0–1.0)
WBC, UA: NONE SEEN (ref 0–?)
pH: 6 (ref 5.0–8.0)

## 2024-01-01 LAB — COMPREHENSIVE METABOLIC PANEL WITH GFR
ALT: 19 U/L (ref 0–53)
AST: 16 U/L (ref 0–37)
Albumin: 4.4 g/dL (ref 3.5–5.2)
Alkaline Phosphatase: 80 U/L (ref 39–117)
BUN: 37 mg/dL — ABNORMAL HIGH (ref 6–23)
CO2: 27 meq/L (ref 19–32)
Calcium: 9.3 mg/dL (ref 8.4–10.5)
Chloride: 99 meq/L (ref 96–112)
Creatinine, Ser: 1.83 mg/dL — ABNORMAL HIGH (ref 0.40–1.50)
GFR: 33.56 mL/min — ABNORMAL LOW (ref >60.00)
Glucose, Bld: 127 mg/dL — ABNORMAL HIGH (ref 70–99)
Potassium: 4.4 meq/L (ref 3.5–5.1)
Sodium: 136 meq/L (ref 135–145)
Total Bilirubin: 0.6 mg/dL (ref 0.2–1.2)
Total Protein: 6.9 g/dL (ref 6.0–8.3)

## 2024-01-01 LAB — CBC WITH DIFFERENTIAL/PLATELET
Basophils Absolute: 0.1 K/uL (ref 0.0–0.1)
Basophils Relative: 1.2 % (ref 0.0–3.0)
Eosinophils Absolute: 0.5 K/uL (ref 0.0–0.7)
Eosinophils Relative: 7.6 % — ABNORMAL HIGH (ref 0.0–5.0)
HCT: 44.6 % (ref 39.0–52.0)
Hemoglobin: 14.3 g/dL (ref 13.0–17.0)
Lymphocytes Relative: 22.4 % (ref 12.0–46.0)
Lymphs Abs: 1.6 K/uL (ref 0.7–4.0)
MCHC: 32.1 g/dL (ref 30.0–36.0)
MCV: 81.4 fl (ref 78.0–100.0)
Monocytes Absolute: 0.7 K/uL (ref 0.1–1.0)
Monocytes Relative: 10.3 % (ref 3.0–12.0)
Neutro Abs: 4.1 K/uL (ref 1.4–7.7)
Neutrophils Relative %: 58.5 % (ref 43.0–77.0)
Platelets: 160 K/uL (ref 150.0–400.0)
RBC: 5.48 Mil/uL (ref 4.22–5.81)
RDW: 17 % — ABNORMAL HIGH (ref 11.5–15.5)
WBC: 7 K/uL (ref 4.0–10.5)

## 2024-01-01 LAB — VITAMIN B12: Vitamin B-12: 649 pg/mL (ref 211–911)

## 2024-01-01 LAB — HEMOGLOBIN A1C: Hgb A1c MFr Bld: 7.2 % — ABNORMAL HIGH (ref 4.6–6.5)

## 2024-01-01 LAB — LDL CHOLESTEROL, DIRECT: Direct LDL: 66 mg/dL

## 2024-01-01 NOTE — Telephone Encounter (Signed)
 Requesting:CLOPIDOGREL  75 MG TAB[*] Last Visit: 10/02/2023 Next Visit: 01/01/2024 Last Refill: 09/01/2023  Please Advise

## 2024-01-01 NOTE — Progress Notes (Signed)
 Established Patient Office Visit   Subjective:  Patient ID: [name] Little, male    DOB: 01/13/1941  Age: 83 y.o. MRN: [id number]  Chief Complaint  Patient presents with   6 month follow up    Pt is here today to be seen for his 6 month follow up. Pt would like to discuss 2 supplements to see if he can take them.     HPI Encounter Diagnoses  Name Primary?   Type 2 diabetes mellitus with diabetic neuropathy, without long-term current use of insulin (HCC) Yes   Immunization due    Essential hypertension    Stage 3b chronic kidney disease (HCC)    Mixed hyperlipidemia    Coronary artery disease involving native coronary artery of native heart without angina pectoris    History of gout    B12 deficiency    For follow-up of above.  History of hypertension with coronary artery disease and cardiomyopathy.  Blood pressure well-controlled with enalapril , metoprolol , amlodipine  and furosemide .  Diabetes controlled with Jardiance .  Kidney function remains stable.  Continues physical activity to the extent he is able.  Yeah   Review of Systems  Constitutional: Negative.   HENT: Negative.    Eyes:  Negative for blurred vision, discharge and redness.  Respiratory: Negative.    Cardiovascular: Negative.   Gastrointestinal:  Negative for abdominal pain.  Genitourinary: Negative.   Musculoskeletal: Negative.  Negative for myalgias.  Skin:  Negative for rash.  Neurological:  Positive for tingling. Negative for loss of consciousness and weakness.  Endo/Heme/Allergies:  Negative for polydipsia.     Current Outpatient Medications:    acetaminophen  (TYLENOL ) 500 MG tablet, Take 1,000 mg by mouth every 6 (six) hours as needed for mild pain, moderate pain or headache (Allergies). , Disp: , Rfl:    amLODipine  (NORVASC ) 2.5 MG tablet, TAKE ONE TABLET BY MOUTH ONE TIME DAILY, Disp: 90 tablet, Rfl: 3   atorvastatin  (LIPITOR) 40 MG tablet, TAKE ONE TABLET BY MOUTH AT BEDTIME, Disp: 90 tablet,  Rfl: 4   Azelastine  HCl 137 MCG/SPRAY SOLN, SPRAY TWO SPRAYS IN EACH NOSTRIL ONE TIME DAILY AT BEDTIME AS NEEDED FOR RUNNY NOSE OR ALLERGIES, Disp: 30 mL, Rfl: 12   clopidogrel  (PLAVIX ) 75 MG tablet, TAKE ONE TABLET BY MOUTH ONE TIME DAILY WITH BREAKFAST, Disp: 90 tablet, Rfl: 0   Coenzyme Q10 (COQ10) 400 MG CAPS, Take 400 mg by mouth daily. , Disp: , Rfl:    colchicine  0.6 MG tablet, TAKE ONE TABLET BY MOUTH ONE TIME DAILY, Disp: 7 tablet, Rfl: 0   diclofenac  Sodium (VOLTAREN ) 1 % GEL, APPLY A SMALL GRAPE SIZED DOLLOP TO LEFT ANKLE THREE TIMES A DAY, Disp: 150 g, Rfl: 2   enalapril  (VASOTEC ) 10 MG tablet, TAKE ONE TABLET BY MOUTH TWICE A DAY, Disp: 180 tablet, Rfl: 0   Flaxseed, Linseed, (FLAX SEED OIL PO), Take 1,300 mg by mouth daily at 12 noon. Omega 3, Disp: , Rfl:    fluticasone  (FLONASE ) 50 MCG/ACT nasal spray, Place 2 sprays into both nostrils 2 (two) times daily., Disp: 16 g, Rfl: 6   furosemide  (LASIX ) 20 MG tablet, TAKE ONE TABLET BY MOUTH ONE TIME DAILY, Disp: 90 tablet, Rfl: 1   JARDIANCE  10 MG TABS tablet, TAKE ONE TABLET BY MOUTH ONE TIME DAILY BEFORE BREAKFAST, Disp: 30 tablet, Rfl: 5   levocetirizine (XYZAL  ALLERGY 24HR) 5 MG tablet, Take 1 tablet (5 mg total) by mouth every evening., Disp: 30 tablet, Rfl: 3  metoprolol  tartrate (LOPRESSOR ) 50 MG tablet, TAKE ONE TABLET BY MOUTH TWICE A DAY, Disp: 180 tablet, Rfl: 1   ofloxacin  (FLOXIN ) 0.3 % OTIC solution, Place 10 drops into the right ear daily., Disp: 10 mL, Rfl: 0   pantoprazole  (PROTONIX ) 40 MG tablet, TAKE ONE TABLET BY MOUTH ONE TIME DAILY, Disp: 90 tablet, Rfl: 1  Current Facility-Administered Medications:    sodium chloride  flush (NS) 0.9 % injection 3 mL, 3 mL, Intravenous, Q12H, Court Dorn PARAS, MD   Objective:     BP 126/84 (BP Location: Right Arm, Cuff Size: Normal)   Pulse (!) 55   Temp (!) 97.2 F (36.2 C) (Temporal)   Ht 5' 8 (1.727 m)   Wt 183 lb 12.8 oz (83.4 kg)   SpO2 98%   BMI 27.95 kg/m     Physical Exam Constitutional:      General: He is not in acute distress.    Appearance: Normal appearance. He is not ill-appearing, toxic-appearing or diaphoretic.  HENT:     Head: Normocephalic and atraumatic.     Right Ear: External ear normal.     Left Ear: External ear normal.     Mouth/Throat:     Mouth: Mucous membranes are moist.     Pharynx: Oropharynx is clear. No oropharyngeal exudate or posterior oropharyngeal erythema.  Eyes:     General: No scleral icterus.       Right eye: No discharge.        Left eye: No discharge.     Extraocular Movements: Extraocular movements intact.     Conjunctiva/sclera: Conjunctivae normal.     Pupils: Pupils are equal, round, and reactive to light.  Cardiovascular:     Rate and Rhythm: Normal rate and regular rhythm.     Pulses:          Dorsalis pedis pulses are 2+ on the right side and 2+ on the left side.       Posterior tibial pulses are 1+ on the right side and 1+ on the left side.  Pulmonary:     Effort: Pulmonary effort is normal. No respiratory distress.     Breath sounds: Normal breath sounds. No wheezing or rales.  Abdominal:     General: Bowel sounds are normal.  Musculoskeletal:     Cervical back: No rigidity or tenderness.  Skin:    General: Skin is warm and dry.  Neurological:     Mental Status: He is alert and oriented to person, place, and time.  Psychiatric:        Mood and Affect: Mood normal.        Behavior: Behavior normal.    Diabetic Foot Exam - Simple   Simple Foot Form Diabetic Foot exam was performed with the following findings: Yes 01/01/2024  8:29 AM  Visual Inspection See comments: Yes Sensation Testing See comments: Yes Pulse Check Posterior Tibialis and Dorsalis pulse intact bilaterally: Yes Comments Feet are cavus bilaterally.  Nails of great toes are thick and oncotic.  There are no lesions or ulcerations.  Decreased sensation to light touch with monofilament line in all toes.        No results found for any visits on 01/01/24.    The ASCVD Risk score (Arnett DK, et al., 2019) failed to calculate for the following reasons:   The 2019 ASCVD risk score is only valid for ages 71 to 92   Risk score cannot be calculated because patient has a medical history suggesting prior/existing  ASCVD    Assessment & Plan:   Type 2 diabetes mellitus with diabetic neuropathy, without long-term current use of insulin (HCC) -     Comprehensive metabolic panel with GFR -     Hemoglobin A1c  Immunization due -     Flu vaccine HIGH DOSE PF(Fluzone Trivalent)  Essential hypertension -     CBC with Differential/Platelet -     Comprehensive metabolic panel with GFR -     Urinalysis, Routine w reflex microscopic  Stage 3b chronic kidney disease (HCC) -     Comprehensive metabolic panel with GFR  Mixed hyperlipidemia -     Comprehensive metabolic panel with GFR -     LDL cholesterol, direct  Coronary artery disease involving native coronary artery of native heart without angina pectoris -     Comprehensive metabolic panel with GFR -     LDL cholesterol, direct  History of gout  B12 deficiency -     Vitamin B12    Return in about 6 months (around 07/01/2024), or if symptoms worsen or fail to improve.  Continue all medications as above.  Adjustments made pending results of labs today.   Elsie Sim Lent, MD

## 2024-01-03 ENCOUNTER — Encounter: Payer: Self-pay | Admitting: Family Medicine

## 2024-01-05 ENCOUNTER — Other Ambulatory Visit: Payer: Self-pay

## 2024-01-05 MED ORDER — EMPAGLIFLOZIN 10 MG PO TABS: 10.0000 mg | ORAL_TABLET | Freq: Every day | ORAL | 5 refills | Status: DC

## 2024-01-07 ENCOUNTER — Ambulatory Visit (INDEPENDENT_AMBULATORY_CARE_PROVIDER_SITE_OTHER): Admitting: Audiology

## 2024-01-20 ENCOUNTER — Other Ambulatory Visit: Payer: Self-pay

## 2024-01-20 ENCOUNTER — Encounter: Payer: Self-pay | Admitting: Family Medicine

## 2024-01-20 MED ORDER — FUROSEMIDE 20 MG PO TABS
20.0000 mg | ORAL_TABLET | Freq: Every day | ORAL | 1 refills | Status: AC
Start: 2024-01-20 — End: ?

## 2024-01-21 ENCOUNTER — Other Ambulatory Visit: Payer: Self-pay | Admitting: Family Medicine

## 2024-01-21 DIAGNOSIS — I10 Essential (primary) hypertension: Secondary | ICD-10-CM

## 2024-01-22 ENCOUNTER — Other Ambulatory Visit: Payer: Self-pay

## 2024-01-22 DIAGNOSIS — K219 Gastro-esophageal reflux disease without esophagitis: Secondary | ICD-10-CM

## 2024-01-22 MED ORDER — PANTOPRAZOLE SODIUM 40 MG PO TBEC
40.0000 mg | DELAYED_RELEASE_TABLET | Freq: Every day | ORAL | 1 refills | Status: DC
Start: 2024-01-22 — End: 2024-01-26

## 2024-01-23 ENCOUNTER — Other Ambulatory Visit: Payer: Self-pay | Admitting: Family Medicine

## 2024-01-23 NOTE — Progress Notes (Signed)
 LOGUN COLAVITO III                                          MRN: [id number]   01/23/2024   The [facility] Quality Team Specialist reviewed this patient medical record for the purposes of chart review for care gap closure. The following were reviewed: chart review for care gap closure-kidney health evaluation for diabetes:eGFR  and uACR.    [facility] Quality Team

## 2024-01-25 ENCOUNTER — Encounter: Payer: Self-pay | Admitting: Family Medicine

## 2024-01-26 ENCOUNTER — Other Ambulatory Visit: Payer: Self-pay

## 2024-01-26 DIAGNOSIS — K219 Gastro-esophageal reflux disease without esophagitis: Secondary | ICD-10-CM

## 2024-01-26 MED ORDER — PANTOPRAZOLE SODIUM 40 MG PO TBEC: 40.0000 mg | DELAYED_RELEASE_TABLET | Freq: Every day | ORAL | 1 refills | Status: AC

## 2024-02-19 NOTE — Progress Notes (Signed)
 [name] Little                                          MRN: [id number]   02/19/2024   The [facility] Quality Team Specialist reviewed this patient medical record for the purposes of chart review for care gap closure. The following were reviewed: chart review for care gap closure-kidney health evaluation for diabetes:eGFR  and uACR.    [facility] Quality Team

## 2024-02-29 ENCOUNTER — Encounter: Payer: Self-pay | Admitting: Family Medicine

## 2024-03-01 ENCOUNTER — Other Ambulatory Visit: Payer: Self-pay

## 2024-03-01 MED ORDER — ENALAPRIL MALEATE 10 MG PO TABS: 10.0000 mg | ORAL_TABLET | Freq: Two times a day (BID) | ORAL | 0 refills | Status: AC

## 2024-03-15 ENCOUNTER — Encounter: Payer: Self-pay | Admitting: Family Medicine

## 2024-03-15 MED ORDER — AMLODIPINE BESYLATE 2.5 MG PO TABS: 2.5000 mg | ORAL_TABLET | Freq: Every day | ORAL | 0 refills | Status: AC

## 2024-03-25 ENCOUNTER — Encounter: Payer: Self-pay | Admitting: Family Medicine

## 2024-03-25 MED ORDER — ATORVASTATIN CALCIUM 40 MG PO TABS: 40.0000 mg | ORAL_TABLET | Freq: Every day | ORAL | 2 refills | Status: AC

## 2024-03-26 NOTE — Progress Notes (Signed)
 [name] Little                                          MRN: [id number]   03/26/2024   The [facility] Quality Team Specialist reviewed this patient medical record for the purposes of chart review for care gap closure. The following were reviewed: chart review for care gap closure-controlling blood pressure.    [facility] Quality Team

## 2024-04-01 ENCOUNTER — Encounter: Payer: Self-pay | Admitting: Family Medicine

## 2024-04-01 DIAGNOSIS — N1832 Chronic kidney disease, stage 3b: Secondary | ICD-10-CM

## 2024-04-01 DIAGNOSIS — E1140 Type 2 diabetes mellitus with diabetic neuropathy, unspecified: Secondary | ICD-10-CM

## 2024-04-01 MED ORDER — EMPAGLIFLOZIN 10 MG PO TABS: 10.0000 mg | ORAL_TABLET | Freq: Every day | ORAL | 2 refills | Status: AC

## 2024-04-02 ENCOUNTER — Other Ambulatory Visit: Payer: Self-pay | Admitting: Family Medicine

## 2024-04-02 DIAGNOSIS — I6523 Occlusion and stenosis of bilateral carotid arteries: Secondary | ICD-10-CM

## 2024-06-29 ENCOUNTER — Ambulatory Visit: Admitting: Family Medicine
